# Patient Record
Sex: Male | Born: 1950
Health system: Southern US, Community
[De-identification: ages and names within clinical notes are randomized; demographics above are authoritative.]

## PROBLEM LIST (undated history)

## (undated) DIAGNOSIS — I2510 Atherosclerotic heart disease of native coronary artery without angina pectoris: Secondary | ICD-10-CM

## (undated) DIAGNOSIS — K635 Polyp of colon: Secondary | ICD-10-CM

## (undated) DIAGNOSIS — E039 Hypothyroidism, unspecified: Secondary | ICD-10-CM

## (undated) DIAGNOSIS — I10 Essential (primary) hypertension: Secondary | ICD-10-CM

## (undated) DIAGNOSIS — E785 Hyperlipidemia, unspecified: Secondary | ICD-10-CM

## (undated) DIAGNOSIS — N182 Chronic kidney disease, stage 2 (mild): Secondary | ICD-10-CM

## (undated) DIAGNOSIS — K219 Gastro-esophageal reflux disease without esophagitis: Secondary | ICD-10-CM

## (undated) DIAGNOSIS — J9819 Other pulmonary collapse: Secondary | ICD-10-CM

## (undated) HISTORY — DX: Chronic kidney disease, stage 2 (mild): N18.2

## (undated) HISTORY — DX: Atherosclerotic heart disease of native coronary artery without angina pectoris: I25.10

## (undated) HISTORY — PX: OTHER SURGICAL HISTORY: SHX169

## (undated) HISTORY — PX: ABDOMINAL SURGERY: SHX537

## (undated) HISTORY — DX: Hyperlipidemia, unspecified: E78.5

## (undated) HISTORY — DX: Hypothyroidism, unspecified: E03.9

## (undated) HISTORY — DX: Polyp of colon: K63.5

## (undated) HISTORY — DX: Essential (primary) hypertension: I10

---

## 2004-04-02 ENCOUNTER — Inpatient Hospital Stay (HOSPITAL_COMMUNITY): Admission: EM | Admit: 2004-04-02 | Discharge: 2004-05-01 | Payer: Self-pay

## 2004-05-25 ENCOUNTER — Encounter: Admission: RE | Admit: 2004-05-25 | Discharge: 2004-07-28 | Payer: Self-pay | Admitting: Orthopaedic Surgery

## 2004-06-16 ENCOUNTER — Emergency Department (HOSPITAL_COMMUNITY): Admission: EM | Admit: 2004-06-16 | Discharge: 2004-06-16 | Payer: Self-pay | Admitting: *Deleted

## 2008-05-04 ENCOUNTER — Encounter: Payer: Self-pay | Admitting: Emergency Medicine

## 2008-05-04 ENCOUNTER — Ambulatory Visit: Payer: Self-pay | Admitting: Cardiovascular Disease

## 2008-05-05 ENCOUNTER — Inpatient Hospital Stay (HOSPITAL_COMMUNITY): Admission: RE | Admit: 2008-05-05 | Discharge: 2008-05-12 | Payer: Self-pay | Admitting: Cardiology

## 2008-05-05 ENCOUNTER — Encounter: Payer: Self-pay | Admitting: Cardiology

## 2008-05-06 ENCOUNTER — Encounter: Payer: Self-pay | Admitting: Gastroenterology

## 2008-05-12 ENCOUNTER — Ambulatory Visit: Payer: Self-pay | Admitting: Gastroenterology

## 2008-07-06 ENCOUNTER — Ambulatory Visit: Payer: Self-pay | Admitting: Gastroenterology

## 2008-11-23 ENCOUNTER — Emergency Department (HOSPITAL_COMMUNITY): Admission: EM | Admit: 2008-11-23 | Discharge: 2008-11-23 | Payer: Self-pay | Admitting: Emergency Medicine

## 2011-03-12 ENCOUNTER — Emergency Department (HOSPITAL_COMMUNITY)
Admission: EM | Admit: 2011-03-12 | Discharge: 2011-03-12 | Disposition: A | Discharge status: Home / Self Care | Payer: Self-pay | Attending: Emergency Medicine | Admitting: Emergency Medicine

## 2011-03-12 ENCOUNTER — Encounter (HOSPITAL_COMMUNITY): Payer: Self-pay

## 2011-03-12 ENCOUNTER — Emergency Department (HOSPITAL_COMMUNITY): Payer: Self-pay

## 2011-03-12 DIAGNOSIS — J189 Pneumonia, unspecified organism: Secondary | ICD-10-CM | POA: Insufficient documentation

## 2011-03-12 DIAGNOSIS — R059 Cough, unspecified: Secondary | ICD-10-CM | POA: Insufficient documentation

## 2011-03-12 DIAGNOSIS — R05 Cough: Secondary | ICD-10-CM | POA: Insufficient documentation

## 2011-03-12 DIAGNOSIS — R197 Diarrhea, unspecified: Secondary | ICD-10-CM | POA: Insufficient documentation

## 2011-03-12 DIAGNOSIS — R109 Unspecified abdominal pain: Secondary | ICD-10-CM | POA: Insufficient documentation

## 2011-03-12 HISTORY — DX: Gastro-esophageal reflux disease without esophagitis: K21.9

## 2011-03-12 LAB — DIFFERENTIAL
Basophils Absolute: 0 10*3/uL (ref 0.0–0.1)
Monocytes Absolute: 0.6 10*3/uL (ref 0.1–1.0)
Monocytes Relative: 5 % (ref 3–12)
Neutro Abs: 9.7 10*3/uL — ABNORMAL HIGH (ref 1.7–7.7)
Neutrophils Relative %: 83 % — ABNORMAL HIGH (ref 43–77)

## 2011-03-12 LAB — URINALYSIS, ROUTINE W REFLEX MICROSCOPIC
Bilirubin Urine: NEGATIVE
Glucose, UA: NEGATIVE mg/dL
Specific Gravity, Urine: 1.014 (ref 1.005–1.030)
Urobilinogen, UA: 0.2 mg/dL (ref 0.0–1.0)
pH: 6 (ref 5.0–8.0)

## 2011-03-12 LAB — COMPREHENSIVE METABOLIC PANEL
ALT: 41 U/L (ref 0–53)
BUN: 14 mg/dL (ref 6–23)
Chloride: 104 mEq/L (ref 96–112)
Creatinine, Ser: 1.51 mg/dL — ABNORMAL HIGH (ref 0.4–1.5)
GFR calc Af Amer: 58 mL/min — ABNORMAL LOW (ref >60)
GFR calc non Af Amer: 48 mL/min — ABNORMAL LOW (ref >60)
Glucose, Bld: 91 mg/dL (ref 70–99)
Total Bilirubin: 0.6 mg/dL (ref 0.3–1.2)

## 2011-03-12 LAB — CBC
Hemoglobin: 11.5 g/dL — ABNORMAL LOW (ref 13.0–17.0)
MCH: 29.7 pg (ref 26.0–34.0)
MCHC: 34.2 g/dL (ref 30.0–36.0)
Platelets: 140 10*3/uL — ABNORMAL LOW (ref 150–400)
RDW: 15.8 % — ABNORMAL HIGH (ref 11.5–15.5)

## 2011-03-12 MED ORDER — IOHEXOL 300 MG/ML  SOLN
100.0000 mL | Freq: Once | INTRAMUSCULAR | Status: AC | PRN
  Administered 2011-03-12: 100 mL via INTRAVENOUS

## 2011-04-03 ENCOUNTER — Ambulatory Visit (HOSPITAL_COMMUNITY)
Admission: RE | Admit: 2011-04-03 | Discharge: 2011-04-03 | Disposition: A | Discharge status: Home / Self Care | Payer: Self-pay | Source: Ambulatory Visit | Attending: Family Medicine | Admitting: Family Medicine

## 2011-04-03 ENCOUNTER — Other Ambulatory Visit (HOSPITAL_COMMUNITY): Payer: Self-pay | Admitting: Family Medicine

## 2011-04-03 DIAGNOSIS — J90 Pleural effusion, not elsewhere classified: Secondary | ICD-10-CM | POA: Insufficient documentation

## 2011-04-03 DIAGNOSIS — R52 Pain, unspecified: Secondary | ICD-10-CM

## 2011-04-03 DIAGNOSIS — R0602 Shortness of breath: Secondary | ICD-10-CM | POA: Insufficient documentation

## 2011-04-03 DIAGNOSIS — R079 Chest pain, unspecified: Secondary | ICD-10-CM | POA: Insufficient documentation

## 2011-04-10 NOTE — Discharge Summary (Signed)
NAMEANKITH, EDMONSTON NO.:  [id number]   MEDICAL RECORD NO.:  [id number]          PATIENT TYPE:  INP   LOCATION:  2013                         FACILITY:  [facility]   PHYSICIAN:  Madaline Savage, MD        DATE OF BIRTH:  [birth year]   DATE OF ADMISSION:  05/05/2008  DATE OF DISCHARGE:  05/12/2008                               DISCHARGE SUMMARY   PRIMARY CARE PHYSICIAN:  None.   This patient was admitted under [facility] on May 05, 2008, and we were asked to see as a consult on the May 07, 2008.  We  took care of the patient on May 08, 2008, and Dr. Tamsen Roers has been  taking care of the patient.  I have started seeing the patient for the  first time today.   1. Hypothyroidism, likely Hashimoto thyroiditis.  2. Chronic cough.  3. Abnormal troponins which is noncardiac.   DISCHARGE MEDICATIONS:  1. Aspirin 325 mg daily.  2. Zocor 40 mg daily.  3. Claritin 10 mg daily as needed.  4. Toprol-XL 25 mg daily.  5. Protonix 40 mg twice daily.  6. Reglan 5 mg 3 times daily.  7. Synthroid 100 mcg daily.  8. Atenolol 50 mg every 6 hours as needed.   HISTORY OF PRESENT ILLNESS:  For full history and physical, see the  history and physical dictated by Dr. Lalla Brothers.  Mr. Bordner is a 60-  year-old gentleman who complained and came in with chronic cough.  He  was found to have elevated cardiac enzymes and he was transferred over  from [facility] to the service of Dr. Diona Browner.   PROCEDURES DONE IN THE HOSPITAL:  1. He had an ultrasound of the soft tissue which was done on May 06, 2008, which showed diffusely prominent heterogeneous thyroid      without focal mass.  2. He had a gastric emptying study done on May 07, 2008, which showed      delayed gastric emptying.   PROBLEM LIST:  1. Hypothyroidism.  Mr. Hepp was found to have severe      hypothyroidism on admission.  He had a TSH of 201.49.  His T4  was      0.42.  He was started  on Synthroid, 100 mcg. He sometimes have some      feeling in throat and we suspect that all his symptoms are likely      related to his hypothyroidism.  2. Elevated CPK.  Cardiology was consulted.  Cardiology has seen and      felt this as noncardiac.  Based on this and history, he will need      to follow up with Cardiology as an outpatient.  3. Chronic cough, most likely because of acid reflux disease.  GI was      consulted and he was started on PPI twice daily and at this time,      they also did gastroparesis, which did show some delayed gastric      emptying which could be secondary to  the hypothyroidism.  He was      started on Reglan at this time.   This patient has now been discharged home in stable condition.   FOLLOWUP:  He is asked to follow up with the primary care doctor in  about a week's time.  He will need a TSH check again in about 4-6 weeks.  He also needs to follow up with Dr. Diona Browner in about 2-3 weeks and Dr.  Candelaria Stagers, the gastroenterologist, in about 1 month's time.      Madaline Savage, MD  Electronically Signed     PKN/MEDQ  D:  05/12/2008  T:  05/13/2008  Job:  [id number]
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
## 2011-08-23 LAB — POCT CARDIAC MARKERS
CKMB, poc: 13.7
CKMB, poc: 9.7
Myoglobin, poc: 422
Myoglobin, poc: >500
Operator id: 244461
Operator id: 290111
Troponin i, poc: 0.16 — ABNORMAL HIGH

## 2011-08-23 LAB — LIPID PANEL
HDL: 42
LDL Cholesterol: 196 — ABNORMAL HIGH
Triglycerides: 298 — ABNORMAL HIGH
VLDL: 60 — ABNORMAL HIGH

## 2011-08-23 LAB — DIFFERENTIAL
Eosinophils Relative: 2
Lymphocytes Relative: 24
Monocytes Absolute: 0.5
Monocytes Relative: 5

## 2011-08-23 LAB — BASIC METABOLIC PANEL
BUN: 11
BUN: 6
CO2: 28
CO2: 29
CO2: 30
CO2: 31
CO2: 32
Calcium: 9.2
Calcium: 9.3
Calcium: 9.5
Calcium: 9.7
Chloride: 100
Chloride: 101
Chloride: 105
Creatinine, Ser: 1.19
Creatinine, Ser: 1.29
Creatinine, Ser: 1.38
Creatinine, Ser: 1.47
GFR calc Af Amer: 57 — ABNORMAL LOW
GFR calc Af Amer: >60
GFR calc Af Amer: >60
GFR calc Af Amer: >60
GFR calc Af Amer: >60
GFR calc Af Amer: >60
GFR calc non Af Amer: 47 — ABNORMAL LOW
Glucose, Bld: 96
Glucose, Bld: 99
Potassium: 3.9
Sodium: 139
Sodium: 140
Sodium: 140

## 2011-08-23 LAB — CBC
HCT: 35.9 — ABNORMAL LOW
HCT: 36.4 — ABNORMAL LOW
Hemoglobin: 12.3 — ABNORMAL LOW
Hemoglobin: 12.3 — ABNORMAL LOW
Hemoglobin: 12.8 — ABNORMAL LOW
Hemoglobin: 13.3
MCHC: 33.8
MCHC: 33.9
MCHC: 34.2
MCHC: 34.7
MCV: 88.9
MCV: 90.3
MCV: 90.3
RBC: 3.96 — ABNORMAL LOW
RBC: 3.98 — ABNORMAL LOW
RBC: 3.98 — ABNORMAL LOW
RBC: 4.03 — ABNORMAL LOW
RBC: 4.06 — ABNORMAL LOW
RBC: 4.38
RDW: 15.7 — ABNORMAL HIGH
RDW: 15.9 — ABNORMAL HIGH
RDW: 15.9 — ABNORMAL HIGH
RDW: 16.1 — ABNORMAL HIGH
WBC: 10.8 — ABNORMAL HIGH
WBC: 10.9 — ABNORMAL HIGH

## 2011-08-23 LAB — CK TOTAL AND CKMB (NOT AT ARMC)
Relative Index: 0.4
Total CK: 2489 — ABNORMAL HIGH
Total CK: 2753 — ABNORMAL HIGH

## 2011-08-23 LAB — PROTIME-INR: INR: 1

## 2011-08-23 LAB — COMPREHENSIVE METABOLIC PANEL
ALT: 45
AST: 63 — ABNORMAL HIGH
Albumin: 4
BUN: 5 — ABNORMAL LOW
CO2: 28
Calcium: 9.2
Chloride: 103
Creatinine, Ser: 1.29
GFR calc Af Amer: >60
GFR calc non Af Amer: 58 — ABNORMAL LOW
Potassium: 3.7
Total Protein: 7.7

## 2011-08-23 LAB — EXPECTORATED SPUTUM ASSESSMENT W GRAM STAIN, RFLX TO RESP C

## 2011-08-23 LAB — T4, FREE
Free T4: 0.41 — ABNORMAL LOW
Free T4: 0.42 — ABNORMAL LOW

## 2011-08-23 LAB — MISCELLANEOUS TEST

## 2011-08-23 LAB — TSH: TSH: 201.409 — ABNORMAL HIGH

## 2011-08-23 LAB — CULTURE, RESPIRATORY W GRAM STAIN

## 2011-08-23 LAB — MAGNESIUM: Magnesium: 2.3

## 2011-08-23 LAB — TROPONIN I: Troponin I: <0.01

## 2012-01-24 ENCOUNTER — Encounter (HOSPITAL_COMMUNITY): Payer: Self-pay | Admitting: Emergency Medicine

## 2012-01-24 ENCOUNTER — Other Ambulatory Visit: Payer: Self-pay

## 2012-01-24 ENCOUNTER — Emergency Department (HOSPITAL_COMMUNITY): Payer: Medicare Other

## 2012-01-24 ENCOUNTER — Inpatient Hospital Stay (HOSPITAL_COMMUNITY)
Admission: EM | Admit: 2012-01-24 | Discharge: 2012-01-27 | DRG: 391 | Disposition: A | Discharge status: Home / Self Care | Payer: Medicare Other | Attending: Internal Medicine | Admitting: Internal Medicine

## 2012-01-24 DIAGNOSIS — Z23 Encounter for immunization: Secondary | ICD-10-CM

## 2012-01-24 DIAGNOSIS — K5289 Other specified noninfective gastroenteritis and colitis: Secondary | ICD-10-CM | POA: Diagnosis not present

## 2012-01-24 DIAGNOSIS — K219 Gastro-esophageal reflux disease without esophagitis: Secondary | ICD-10-CM | POA: Diagnosis present

## 2012-01-24 DIAGNOSIS — J189 Pneumonia, unspecified organism: Secondary | ICD-10-CM

## 2012-01-24 DIAGNOSIS — D649 Anemia, unspecified: Secondary | ICD-10-CM | POA: Diagnosis not present

## 2012-01-24 DIAGNOSIS — E876 Hypokalemia: Secondary | ICD-10-CM | POA: Diagnosis not present

## 2012-01-24 DIAGNOSIS — N179 Acute kidney failure, unspecified: Secondary | ICD-10-CM | POA: Diagnosis present

## 2012-01-24 DIAGNOSIS — K529 Noninfective gastroenteritis and colitis, unspecified: Secondary | ICD-10-CM

## 2012-01-24 DIAGNOSIS — R55 Syncope and collapse: Secondary | ICD-10-CM | POA: Diagnosis present

## 2012-01-24 DIAGNOSIS — A088 Other specified intestinal infections: Secondary | ICD-10-CM | POA: Diagnosis not present

## 2012-01-24 DIAGNOSIS — N19 Unspecified kidney failure: Secondary | ICD-10-CM

## 2012-01-24 HISTORY — DX: Other pulmonary collapse: J98.19

## 2012-01-24 LAB — MAGNESIUM: Magnesium: 2.1 mg/dL (ref 1.5–2.5)

## 2012-01-24 LAB — CARDIAC PANEL(CRET KIN+CKTOT+MB+TROPI)
Relative Index: 0.5 (ref 0.0–2.5)
Total CK: 3531 U/L — ABNORMAL HIGH (ref 7–232)

## 2012-01-24 LAB — RETICULOCYTES
Retic Count, Absolute: 41.2 10*3/uL (ref 19.0–186.0)
Retic Ct Pct: 1.2 % (ref 0.4–3.1)

## 2012-01-24 LAB — COMPREHENSIVE METABOLIC PANEL
ALT: 36 U/L (ref 0–53)
CO2: 29 mEq/L (ref 19–32)
Calcium: 10.2 mg/dL (ref 8.4–10.5)
Creatinine, Ser: 1.49 mg/dL — ABNORMAL HIGH (ref 0.50–1.35)
GFR calc Af Amer: 58 mL/min — ABNORMAL LOW (ref >90)
GFR calc non Af Amer: 50 mL/min — ABNORMAL LOW (ref >90)
Glucose, Bld: 105 mg/dL — ABNORMAL HIGH (ref 70–99)

## 2012-01-24 LAB — CBC
HCT: 31.2 % — ABNORMAL LOW (ref 39.0–52.0)
Hemoglobin: 10.5 g/dL — ABNORMAL LOW (ref 13.0–17.0)
MCH: 29.2 pg (ref 26.0–34.0)
MCV: 86.9 fL (ref 78.0–100.0)
RBC: 3.59 MIL/uL — ABNORMAL LOW (ref 4.22–5.81)

## 2012-01-24 LAB — URINALYSIS, ROUTINE W REFLEX MICROSCOPIC
Hgb urine dipstick: NEGATIVE
Protein, ur: NEGATIVE mg/dL
Urobilinogen, UA: 0.2 mg/dL (ref 0.0–1.0)

## 2012-01-24 LAB — POCT I-STAT TROPONIN I: Troponin i, poc: 0 ng/mL (ref 0.00–0.08)

## 2012-01-24 LAB — HIV ANTIBODY (ROUTINE TESTING W REFLEX): HIV: NONREACTIVE

## 2012-01-24 MED ORDER — AZITHROMYCIN 250 MG PO TABS
500.0000 mg | ORAL_TABLET | Freq: Once | ORAL | Status: AC
  Administered 2012-01-24: 500 mg via ORAL
  Filled 2012-01-24: qty 2

## 2012-01-24 MED ORDER — ACETAMINOPHEN 325 MG PO TABS
650.0000 mg | ORAL_TABLET | Freq: Four times a day (QID) | ORAL | Status: DC | PRN
  Filled 2012-01-24: qty 2

## 2012-01-24 MED ORDER — SODIUM CHLORIDE 0.9 % IV SOLN
INTRAVENOUS | Status: DC
  Administered 2012-01-24: 1000 mL via INTRAVENOUS
  Administered 2012-01-25 (×2): via INTRAVENOUS
  Filled 2012-01-24 (×2): qty 1000

## 2012-01-24 MED ORDER — LEVOFLOXACIN IN D5W 500 MG/100ML IV SOLN
500.0000 mg | INTRAVENOUS | Status: DC
  Administered 2012-01-24 – 2012-01-26 (×3): 500 mg via INTRAVENOUS
  Filled 2012-01-24 (×4): qty 100

## 2012-01-24 MED ORDER — MORPHINE SULFATE 2 MG/ML IJ SOLN
1.0000 mg | INTRAMUSCULAR | Status: DC | PRN
  Administered 2012-01-24: 1 mg via INTRAVENOUS
  Filled 2012-01-24 (×2): qty 1

## 2012-01-24 MED ORDER — PANTOPRAZOLE SODIUM 40 MG IV SOLR
40.0000 mg | INTRAVENOUS | Status: DC
  Administered 2012-01-24 – 2012-01-26 (×3): 40 mg via INTRAVENOUS
  Filled 2012-01-24 (×3): qty 40

## 2012-01-24 MED ORDER — METRONIDAZOLE IN NACL 5-0.79 MG/ML-% IV SOLN
500.0000 mg | Freq: Three times a day (TID) | INTRAVENOUS | Status: DC
  Administered 2012-01-24 – 2012-01-26 (×6): 500 mg via INTRAVENOUS
  Filled 2012-01-24 (×10): qty 100

## 2012-01-24 MED ORDER — ACETAMINOPHEN 650 MG RE SUPP
650.0000 mg | Freq: Four times a day (QID) | RECTAL | Status: DC | PRN
  Filled 2012-01-24: qty 1

## 2012-01-24 MED ORDER — SODIUM CHLORIDE 0.9 % IV SOLN
Freq: Once | INTRAVENOUS | Status: AC
  Administered 2012-01-24: 03:00:00 via INTRAVENOUS

## 2012-01-24 MED ORDER — ONDANSETRON HCL 4 MG/2ML IJ SOLN
4.0000 mg | Freq: Four times a day (QID) | INTRAMUSCULAR | Status: DC | PRN
  Filled 2012-01-24: qty 2

## 2012-01-24 MED ORDER — IOHEXOL 300 MG/ML  SOLN
100.0000 mL | Freq: Once | INTRAMUSCULAR | Status: AC | PRN
  Administered 2012-01-24: 100 mL via INTRAVENOUS

## 2012-01-24 MED ORDER — INFLUENZA VIRUS VACC SPLIT PF IM SUSP
0.5000 mL | INTRAMUSCULAR | Status: AC
  Administered 2012-01-25: 0.5 mL via INTRAMUSCULAR
  Filled 2012-01-24: qty 0.5

## 2012-01-24 MED ORDER — ONDANSETRON HCL 4 MG/2ML IJ SOLN
4.0000 mg | Freq: Once | INTRAMUSCULAR | Status: AC
  Administered 2012-01-24: 4 mg via INTRAVENOUS
  Filled 2012-01-24: qty 2

## 2012-01-24 MED ORDER — ONDANSETRON HCL 4 MG PO TABS
4.0000 mg | ORAL_TABLET | Freq: Four times a day (QID) | ORAL | Status: DC | PRN
  Filled 2012-01-24: qty 1

## 2012-01-24 MED ORDER — HYDROMORPHONE HCL PF 1 MG/ML IJ SOLN
1.0000 mg | Freq: Once | INTRAMUSCULAR | Status: AC
  Administered 2012-01-24: 1 mg via INTRAVENOUS
  Filled 2012-01-24: qty 1

## 2012-01-24 MED ORDER — DEXTROSE 5 % IV SOLN
1.0000 g | Freq: Once | INTRAVENOUS | Status: AC
  Administered 2012-01-24: 1 g via INTRAVENOUS
  Filled 2012-01-24: qty 10

## 2012-01-24 MED ORDER — HYDROCODONE-ACETAMINOPHEN 5-325 MG PO TABS
1.0000 | ORAL_TABLET | ORAL | Status: DC | PRN
  Filled 2012-01-24: qty 2
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
## 2012-01-24 NOTE — Progress Notes (Signed)
ED CM noted no pcp listed for pt. He confirmed pcp is [facility] and could not recall the specific MD name.  CM spoke to [facility] at [facility] and made a hospital f/u appt for pt with Dr Venetia Night 03/11/12 10 am. Pt is made aware of appt
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
## 2012-01-24 NOTE — ED Notes (Signed)
ZOX:[id number]<AV> Expected date:<BR> Expected time:<BR> Means of arrival:<BR> Comments:<BR> EMS/abd pain/?LOC

## 2012-01-24 NOTE — ED Notes (Signed)
Brought in by EMS from home. Per EMS, pt woke up at 1230  With abdominal pain.

## 2012-01-25 LAB — COMPREHENSIVE METABOLIC PANEL
AST: 28 U/L (ref 0–37)
Alkaline Phosphatase: 42 U/L (ref 39–117)
CO2: 24 mEq/L (ref 19–32)
Chloride: 104 mEq/L (ref 96–112)
Creatinine, Ser: 1.33 mg/dL (ref 0.50–1.35)
GFR calc non Af Amer: 57 mL/min — ABNORMAL LOW (ref >90)
Potassium: 3.3 mEq/L — ABNORMAL LOW (ref 3.5–5.1)
Total Bilirubin: 0.5 mg/dL (ref 0.3–1.2)

## 2012-01-25 LAB — CARDIAC PANEL(CRET KIN+CKTOT+MB+TROPI): Total CK: 2875 U/L — ABNORMAL HIGH (ref 7–232)

## 2012-01-25 LAB — CBC
MCH: 29.4 pg (ref 26.0–34.0)
MCHC: 33.4 g/dL (ref 30.0–36.0)
Platelets: 104 10*3/uL — ABNORMAL LOW (ref 150–400)
RBC: 3.74 MIL/uL — ABNORMAL LOW (ref 4.22–5.81)
RDW: 15.4 % (ref 11.5–15.5)

## 2012-01-25 MED ORDER — DIPHENHYDRAMINE HCL 25 MG PO CAPS
25.0000 mg | ORAL_CAPSULE | Freq: Four times a day (QID) | ORAL | Status: DC | PRN
  Administered 2012-01-25 – 2012-01-26 (×4): 25 mg via ORAL
  Filled 2012-01-25 (×4): qty 1

## 2012-01-25 MED ORDER — POTASSIUM CHLORIDE 10 MEQ/100ML IV SOLN
10.0000 meq | INTRAVENOUS | Status: AC
  Administered 2012-01-25 (×2): 10 meq via INTRAVENOUS
  Filled 2012-01-25 (×2): qty 100

## 2012-01-25 NOTE — Progress Notes (Signed)
Subjective: Patient relates abdominal pain better, no more diarrhea. Feeling better.  Itching better.   Objective: Filed Vitals:   01/24/12 1846 01/24/12 2118 01/25/12 0519 01/25/12 1336  BP: 109/69 117/69 100/67 113/74  Pulse: 76 69 65 77  Temp: 98.7 F (37.1 C) 98.7 F (37.1 C) 98.2 F (36.8 C) 97.8 F (36.6 C)  TempSrc: Oral Oral Oral Oral  Resp: 18 18 18 18   Height: 5\' 4"  (1.626 m)     Weight: 73.9 kg (162 lb 14.7 oz)  73.4 kg (161 lb 13.1 oz)   SpO2: 87% 90% 89% 97%   Weight change:   Intake/Output Summary (Last 24 hours) at 01/25/12 1405 Last data filed at 01/25/12 1247  Gross per 24 hour  Intake    100 ml  Output   1150 ml  Net  -1050 ml    General: Alert, awake, oriented x3, in no acute distress.  HEENT: No bruits, no goiter.  Heart: Regular rate and rhythm, without murmurs, rubs, gallops.  Lungs: Crackles left side, bilateral air movement.  Abdomen: Soft, nontender, nondistended, positive bowel sounds.  Neuro: Grossly intact, nonfocal. Extremities; no edema.   Lab Results:  Basename 01/25/12 0500 01/24/12 2030 01/24/12 0310  NA 138 -- 138  K 3.3* -- 3.8  CL 104 -- 100  CO2 24 -- 29  GLUCOSE 89 -- 105*  BUN 7 -- 12  CREATININE 1.33 -- 1.49*  CALCIUM 9.1 -- 10.2  MG -- 2.1 --  PHOS -- -- --    Basename 01/25/12 0500 01/24/12 0310  AST 28 52*  ALT 21 36  ALKPHOS 42 50  BILITOT 0.5 0.4  PROT 6.9 8.1  ALBUMIN 3.9 4.7    Basename 01/24/12 0310  LIPASE 43  AMYLASE --    Basename 01/25/12 0500 01/24/12 0310  WBC 10.9* 8.3  NEUTROABS -- --  HGB 11.0* 10.5*  HCT 32.9* 31.2*  MCV 88.0 86.9  PLT 104* 135*    Basename 01/25/12 0035 01/24/12 1855 01/24/12 0815  CKTOTAL 2875* 3247* 3531*  CKMB 13.5* 14.7* 14.5*  CKMBINDEX -- -- --  TROPONINI <0.30 <0.30 <0.30    Basename 01/24/12 0950  VITAMINB12 --  FOLATE --  FERRITIN --  TIBC --  IRON --  RETICCTPCT 1.2     Studies/Results: Dg Chest 2 View  01/24/2012  *RADIOLOGY REPORT*   Clinical Data: Cough and congestion, shortness of breath, chest pain  CHEST - 2 VIEW  Comparison: Apr 03, 2011  Findings: Mild cardiomegaly is unchanged.  The mediastinum pulmonary vasculature are within normal limits.  There is chronic elevation of the left hemidiaphragm.  Increased opacity in the left lung base could represent atelectasis or pneumonia.  No gross effusion.  IMPRESSION: Left basilar atelectasis versus infiltrate.  Chronic elevation of the left hemi diaphragm.  Original Report Authenticated By: Brandon Melnick, M.D.   Ct Abdomen Pelvis W Contrast  01/24/2012  *RADIOLOGY REPORT*  Clinical Data: Severe upper abdominal pain.  CT ABDOMEN AND PELVIS WITH CONTRAST  Technique:  Multidetector CT imaging of the abdomen and pelvis was performed following the standard protocol during bolus administration of intravenous contrast.  Contrast: OMNIPAQUE IOHEXOL 300 MG/ML IJ SOLN  Comparison: CT of the abdomen and pelvis performed 03/12/2011  Findings: Left lower lobe airspace opacification, with air bronchograms, raises concern for pneumonia.  Contrast is noted within the distal esophagus, raising question for mild esophageal dysmotility or gastroesophageal reflux.  The liver and spleen are unremarkable in appearance.  The  gallbladder is within normal limits.  The pancreas and adrenal glands are unremarkable.  Mild nonspecific perinephric stranding is noted bilaterally.  The kidneys are otherwise unremarkable in appearance.  There is no evidence of hydronephrosis.  No renal or ureteral stones are seen.  The small bowel is unremarkable in appearance.  The stomach is filled with contrast and is grossly unremarkable in appearance.  No acute vascular abnormalities are seen.  Mild scattered calcification is noted along the distal abdominal aorta and its branches.  A tiny umbilical hernia is noted, containing only fat.  The appendix is borderline prominent but contains air, without definite evidence of  appendicitis.  There is mild diffuse wall thickening noted along the entirety of the colon, with minimal associated soft tissue inflammation, compatible with mild diffuse colitis.  There is no evidence of perforation or abscess formation.  No significant free fluid is seen.  The bladder is mildly distended and grossly unremarkable in appearance.  The prostate is normal in size, with scattered calcification.  There is a relatively superior position to the right testis, along the inferior right inguinal canal.  No inguinal lymphadenopathy is seen.  No acute osseous abnormalities are identified.  IMPRESSION:  1.  Mild diffuse colitis noted, with diffuse wall thickening along the entirety of the colon, and minimal associated soft tissue inflammation.  No evidence of perforation or abscess formation.  No free fluid seen. 2.  Left lower lobe airspace opacification, with air bronchograms, raises concern for pneumonia. 3.  Tiny umbilical hernia, containing only fat. 4.  Contrast within the distal esophagus raises question for mild esophageal dysmotility or possibly gastroesophageal reflux. 5.  Relatively superior position to the right testis, along the inferior right inguinal canal; this may be transient in nature. 6.  Mild scattered calcification along the distal abdominal aorta and its branches.  Original Report Authenticated By: Tonia Ghent, M.D.    Medications: I have reviewed the patient's current medications.  Colitis - presumed infectious origin (01/24/2012) Patient presents with abdominal pain nausea, vomiting and  diarrhea. CT findings consistent with a mild diffuse colitis. This is probably infectious in origin.  C. difficile, stool culture.  Continue with Levaquin and Flagyl day 2. We'll check guaiac stool.   PNA (pneumonia) (01/24/2012) Patient is complaining of cough, subjective fever. CT abdomen show possibility of left lower lobe opacification concern for pneumonia.  chest x-ray left lower lobe  infiltrates vs atelectasis . Levaquin to cover for pneumonia. I will check a sputum culture, HIV negative.   Syncope and collapse (01/24/2012) Patient presented after a syncope event in the setting of dehydration , also question of vaso vagal syncope. Troponin negative. Renal failure (01/24/2012) Patient with prior creatinine at 1.5 per records in 2009. Unclear Cr baseline. I will treat for acute renal insufficiency. Nsl.   Anemia; I will check anemia panel. Guaiac stool.  GERD: Protonix.  Hypokalemia; replete with IV 2 runs.      LOS: 1 day   Rainer Mounce M.D.  [facility] 01/25/2012, 2:05 PM

## 2012-01-26 LAB — OCCULT BLOOD X 1 CARD TO LAB, STOOL: Fecal Occult Bld: NEGATIVE

## 2012-01-26 LAB — CLOSTRIDIUM DIFFICILE BY PCR: Toxigenic C. Difficile by PCR: NEGATIVE

## 2012-01-26 LAB — CBC
MCH: 29.3 pg (ref 26.0–34.0)
MCHC: 33.7 g/dL (ref 30.0–36.0)
Platelets: 105 10*3/uL — ABNORMAL LOW (ref 150–400)
RDW: 15.5 % (ref 11.5–15.5)

## 2012-01-26 LAB — BASIC METABOLIC PANEL
Calcium: 8.9 mg/dL (ref 8.4–10.5)
GFR calc non Af Amer: 49 mL/min — ABNORMAL LOW (ref >90)
Sodium: 137 mEq/L (ref 135–145)

## 2012-01-26 MED ORDER — AZITHROMYCIN 500 MG PO TABS
500.0000 mg | ORAL_TABLET | Freq: Every day | ORAL | Status: DC
  Administered 2012-01-26 – 2012-01-27 (×2): 500 mg via ORAL
  Filled 2012-01-26 (×3): qty 1

## 2012-01-26 MED ORDER — POTASSIUM CHLORIDE CRYS ER 20 MEQ PO TBCR
40.0000 meq | EXTENDED_RELEASE_TABLET | Freq: Once | ORAL | Status: AC
  Administered 2012-01-26: 40 meq via ORAL
  Filled 2012-01-26: qty 2

## 2012-01-26 MED ORDER — PANTOPRAZOLE SODIUM 40 MG PO TBEC
40.0000 mg | DELAYED_RELEASE_TABLET | Freq: Every day | ORAL | Status: DC
  Administered 2012-01-27: 40 mg via ORAL
  Filled 2012-01-26 (×2): qty 1

## 2012-01-26 MED ORDER — SODIUM CHLORIDE 0.9 % IJ SOLN
3.0000 mL | Freq: Two times a day (BID) | INTRAMUSCULAR | Status: DC
  Administered 2012-01-26 – 2012-01-27 (×2): 3 mL via INTRAVENOUS

## 2012-01-26 MED ORDER — METRONIDAZOLE 500 MG PO TABS
500.0000 mg | ORAL_TABLET | Freq: Three times a day (TID) | ORAL | Status: DC
  Administered 2012-01-26 – 2012-01-27 (×4): 500 mg via ORAL
  Filled 2012-01-26 (×9): qty 1

## 2012-01-26 NOTE — Progress Notes (Signed)
The patient is receiving Protonix by the intravenous route.  Based on criteria approved by the Pharmacy and Therapeutics Committee and the Medical Executive Committee, the medication is being converted to the equivalent oral dose form.  These criteria include: -No Active GI bleeding -Able to tolerate diet of full liquids (or better) or tube feeding -Able to tolerate other medications by the oral or enteral route  If you have any questions about this conversion, please contact the Pharmacy Department (ext [phone]).  Thank you.  Chilton Si, Adar Rase L 2:42 PM

## 2012-01-26 NOTE — Progress Notes (Signed)
Subjective: Denies abdominal pain. No chest pain. Stool soft, diarrhea improved.  Objective: Filed Vitals:   01/25/12 0519 01/25/12 1336 01/25/12 2123 01/26/12 0500  BP: 100/67 113/74 111/72   Pulse: 65 77 72   Temp: 98.2 F (36.8 C) 97.8 F (36.6 C) 98 F (36.7 C)   TempSrc: Oral Oral Oral   Resp: 18 18 18    Height:      Weight: 73.4 kg (161 lb 13.1 oz)   73.8 kg (162 lb 11.2 oz)  SpO2: 89% 97% 96%    Weight change: -0.1 kg (-3.5 oz)  Intake/Output Summary (Last 24 hours) at 01/26/12 1317 Last data filed at 01/26/12 0800  Gross per 24 hour  Intake    920 ml  Output   1140 ml  Net   -220 ml    General: Alert, awake, oriented x3, in no acute distress.  HEENT: No bruits, no goiter.  Heart: Regular rate and rhythm, without murmurs, rubs, gallops.  Lungs: Crackles left side, bilateral air movement.  Abdomen: Soft, nontender, nondistended, positive bowel sounds.  Extremities no edema.  Lab Results:  Basename 01/26/12 0530 01/25/12 0500 01/24/12 2030  NA 137 138 --  K 3.2* 3.3* --  CL 104 104 --  CO2 27 24 --  GLUCOSE 77 89 --  BUN 5* 7 --  CREATININE 1.51* 1.33 --  CALCIUM 8.9 9.1 --  MG -- -- 2.1  PHOS -- -- --    Basename 01/25/12 0500 01/24/12 0310  AST 28 52*  ALT 21 36  ALKPHOS 42 50  BILITOT 0.5 0.4  PROT 6.9 8.1  ALBUMIN 3.9 4.7    Basename 01/24/12 0310  LIPASE 43  AMYLASE --    Basename 01/26/12 0530 01/25/12 0500  WBC 9.8 10.9*  NEUTROABS -- --  HGB 9.6* 11.0*  HCT 28.5* 32.9*  MCV 86.9 88.0  PLT 105* 104*    Basename 01/25/12 0035 01/24/12 1855 01/24/12 0815  CKTOTAL 2875* 3247* 3531*  CKMB 13.5* 14.7* 14.5*  CKMBINDEX -- -- --  TROPONINI <0.30 <0.30 <0.30    Basename 01/24/12 0950  VITAMINB12 --  FOLATE --  FERRITIN --  TIBC --  IRON --  RETICCTPCT 1.2    Micro Results: No results found for this or any previous visit (from the past 240 hour(s)).  Studies/Results: No results found.  Medications: I have reviewed the  patient's current medications.  Colitis - presumed infectious origin (01/24/2012) Patient presents with abdominal pain nausea, vomiting and diarrhea. CT findings consistent with a mild diffuse colitis. This is probably infectious in origin. C. difficile, stool culture pending. Continue Flagyl day 2. Azithromycin.   PNA (pneumonia) (01/24/2012) Patient is complaining of cough, subjective fever. CT abdomen show possibility of left lower lobe opacification concern for pneumonia. chest x-ray left lower lobe infiltrates vs atelectasis . Levaquin 2 days, but patient with itching. Change antibiotics to azithro. I will check a sputum culture, HIV negative.   Syncope and collapse (01/24/2012) Patient presented after a syncope event in the setting of dehydration , also question of vaso vagal syncope. Troponin negative.  Renal failure (01/24/2012) Patient with prior creatinine at 1.5 per records in 2009. Unclear Cr baseline. I will treat for acute renal insufficiency. Nsl. Cr increase 1.5 likely baseline.  Anemia; monitor hb. Occult blood negative. GERD: Protonix.  Hypokalemia; Replace with 40 meq po times 1.      LOS: 2 days   George Robbins M.D.  [facility] 01/26/2012, 1:17 PM

## 2012-01-27 MED ORDER — PANTOPRAZOLE SODIUM 40 MG PO TBEC: 40.0000 mg | DELAYED_RELEASE_TABLET | Freq: Every day | ORAL | Status: DC

## 2012-01-27 MED ORDER — METRONIDAZOLE 500 MG PO TABS: 500.0000 mg | ORAL_TABLET | Freq: Three times a day (TID) | ORAL | Status: AC

## 2012-01-27 MED ORDER — AZITHROMYCIN 500 MG PO TABS: 500.0000 mg | ORAL_TABLET | Freq: Every day | ORAL | Status: AC

## 2012-01-27 NOTE — Discharge Summary (Signed)
Admit date: 01/24/2012 Discharge date: 01/27/2012  Primary Care Physician:  Jaclyn Shaggy, MD, MD   Discharge Diagnoses:    . Colitis - presumed infectious origin 01/24/2012   . PNA (pneumonia) 01/24/2012   . Syncope and collapse 01/24/2012   . Renal failure 01/24/2012              DISCHARGE MEDICATION: Medication List  As of 01/27/2012  9:49 AM   TAKE these medications         azithromycin 500 MG tablet   Commonly known as: ZITHROMAX   Take 1 tablet (500 mg total) by mouth daily.      metroNIDAZOLE 500 MG tablet   Commonly known as: FLAGYL   Take 1 tablet (500 mg total) by mouth every 8 (eight) hours.      pantoprazole 40 MG tablet   Commonly known as: PROTONIX   Take 1 tablet (40 mg total) by mouth daily at 12 noon.              Consults:  none   SIGNIFICANT DIAGNOSTIC STUDIES:  Dg Chest 2 View  01/24/2012  *RADIOLOGY REPORT*  Clinical Data: Cough and congestion, shortness of breath, chest pain  CHEST - 2 VIEW  Comparison: Apr 03, 2011  Findings: Mild cardiomegaly is unchanged.  The mediastinum pulmonary vasculature are within normal limits.  There is chronic elevation of the left hemidiaphragm.  Increased opacity in the left lung base could represent atelectasis or pneumonia.  No gross effusion.  IMPRESSION: Left basilar atelectasis versus infiltrate.  Chronic elevation of the left hemi diaphragm.  Original Report Authenticated By: Brandon Melnick, M.D.   Ct Abdomen Pelvis W Contrast  01/24/2012  *RADIOLOGY REPORT*  Clinical Data: Severe upper abdominal pain.  CT ABDOMEN AND PELVIS WITH CONTRAST  Technique:  Multidetector CT imaging of the abdomen and pelvis was performed following the standard protocol during bolus administration of intravenous contrast.  Contrast: OMNIPAQUE IOHEXOL 300 MG/ML IJ SOLN  Comparison: CT of the abdomen and pelvis performed 03/12/2011  Findings: Left lower lobe airspace opacification, with air bronchograms, raises concern for pneumonia.   Contrast is noted within the distal esophagus, raising question for mild esophageal dysmotility or gastroesophageal reflux.  The liver and spleen are unremarkable in appearance.  The gallbladder is within normal limits.  The pancreas and adrenal glands are unremarkable.  Mild nonspecific perinephric stranding is noted bilaterally.  The kidneys are otherwise unremarkable in appearance.  There is no evidence of hydronephrosis.  No renal or ureteral stones are seen.  The small bowel is unremarkable in appearance.  The stomach is filled with contrast and is grossly unremarkable in appearance.  No acute vascular abnormalities are seen.  Mild scattered calcification is noted along the distal abdominal aorta and its branches.  A tiny umbilical hernia is noted, containing only fat.  The appendix is borderline prominent but contains air, without definite evidence of appendicitis.  There is mild diffuse wall thickening noted along the entirety of the colon, with minimal associated soft tissue inflammation, compatible with mild diffuse colitis.  There is no evidence of perforation or abscess formation.  No significant free fluid is seen.  The bladder is mildly distended and grossly unremarkable in appearance.  The prostate is normal in size, with scattered calcification.  There is a relatively superior position to the right testis, along the inferior right inguinal canal.  No inguinal lymphadenopathy is seen.  No acute osseous abnormalities are identified.  IMPRESSION:  1.  Mild diffuse colitis noted, with diffuse wall thickening along the entirety of the colon, and minimal associated soft tissue inflammation.  No evidence of perforation or abscess formation.  No free fluid seen. 2.  Left lower lobe airspace opacification, with air bronchograms, raises concern for pneumonia. 3.  Tiny umbilical hernia, containing only fat. 4.  Contrast within the distal esophagus raises question for mild esophageal dysmotility or possibly  gastroesophageal reflux. 5.  Relatively superior position to the right testis, along the inferior right inguinal canal; this may be transient in nature. 6.  Mild scattered calcification along the distal abdominal aorta and its branches.  Original Report Authenticated By: Tonia Ghent, M.D.      Recent Results (from the past 240 hour(s))  CLOSTRIDIUM DIFFICILE BY PCR     Status: Normal   Collection Time   01/26/12 10:26 AM      Component Value Range Status Comment   C difficile by pcr NEGATIVE  NEGATIVE  Final     BRIEF ADMITTING H & P: 61 year old with nonsignificant past medical history present to the emergency department after syncope episode. Patient relate that the night prior to admission he started to have diarrhea and abdominal pain. He had 2 watery stool. When he came from the bathroom, he felt lightheadedness and he passed out on his bed. lasting approximately 5 minutes according to family members. No seizure like activity, no urinary or bowel incontinence. No tongue bite. He has been coughing for the last 5 days prior to admission, he relates also subjective fever. He has vomited multiple times. No blood on it.  Hospital Course:  Colitis - presumed infectious origin (01/24/2012) Patient presents with abdominal pain nausea, vomiting and diarrhea. CT findings consistent with a mild diffuse colitis. This is probably infectious in origin. C. Difficile negative, stool culture pending. Continue Flagyl day 3. Azithromycin. Will give prescription for 5 more days. Patient will need colonoscopy at some point. Needs to follow up with PCP.   PNA (pneumonia) (01/24/2012) Patient is complaining of cough, subjective fever. CT abdomen show possibility of left lower lobe opacification concern for pneumonia. chest x-ray left lower lobe infiltrates vs atelectasis . Received Levaquin 2 days, but patient with itching. Change antibiotics to azithro. HIV negative. Will give prescription for 5 more days.    Syncope and collapse (01/24/2012) Patient presented after a syncope event in the setting of dehydration , also question of vaso vagal syncope. Troponin negative. Resolved. Renal failure (01/24/2012) Patient with prior creatinine at 1.5 per records in 2009. Unclear Cr baseline. I will treat for acute renal insufficiency. Nsl. Cr increase 1.5 likely baseline.  Anemia; monitor hb. Occult blood negative.  GERD: Protonix.  Hypokalemia; Replace.   Disposition and Follow-up:  Discharge Orders    Future Orders Please Complete By Expires   Diet - low sodium heart healthy      Increase activity slowly        Follow-up Information    Follow up on 03/11/2012. ([facility] f[facility]u appt for pt with Dr Venetia Night 03/11/12 10 am)    Contact information:   [address] [place]  [phone]      Follow up with Jaclyn Shaggy, MD .          DISCHARGE EXAM:  General: Alert, awake, oriented x3, in no acute distress.  HEENT: No bruits, no goiter.  Heart: Regular rate and rhythm, without murmurs, rubs, gallops.  Lungs: Crackles left side, bilateral air movement.  Abdomen: Soft, nontender, nondistended, positive bowel sounds.  Extremities no edema.   Blood pressure 109/69, pulse 78, temperature 97.6 F (36.4 C), temperature source Oral, resp. rate 18, height 5\' 4"  (1.626 m), weight 75.1 kg (165 lb 9.1 oz), SpO2 94.00%.   Basename 01/26/12 0530 01/25/12 0500 01/24/12 2030  NA 137 138 --  K 3.2* 3.3* --  CL 104 104 --  CO2 27 24 --  GLUCOSE 77 89 --  BUN 5* 7 --  CREATININE 1.51* 1.33 --  CALCIUM 8.9 9.1 --  MG -- -- 2.1  PHOS -- -- --    Basename 01/25/12 0500  AST 28  ALT 21  ALKPHOS 42  BILITOT 0.5  PROT 6.9  ALBUMIN 3.9   Basename 01/26/12 0530 01/25/12 0500  WBC 9.8 10.9*  NEUTROABS -- --  HGB 9.6* 11.0*  HCT 28.5* 32.9*  MCV 86.9 88.0  PLT 105* 104*    Signed: Ileanna Gemmill M.D. 01/27/2012, 9:49 AM

## 2012-01-27 NOTE — Plan of Care (Signed)
Problem: Phase I Progression Outcomes Goal: EF % per last Echo/documented,Core Reminder form on chart Outcome: Completed/Met Date Met:  01/27/12 EF= 50%

## 2012-01-30 LAB — STOOL CULTURE

## 2013-08-08 ENCOUNTER — Emergency Department (HOSPITAL_COMMUNITY)
Admission: EM | Admit: 2013-08-08 | Discharge: 2013-08-08 | Disposition: A | Discharge status: Home / Self Care | Payer: Medicare Other | Attending: Emergency Medicine | Admitting: Emergency Medicine

## 2013-08-08 ENCOUNTER — Encounter (HOSPITAL_COMMUNITY): Payer: Self-pay | Admitting: Emergency Medicine

## 2013-08-08 ENCOUNTER — Emergency Department (HOSPITAL_COMMUNITY): Payer: Medicare Other

## 2013-08-08 DIAGNOSIS — R0602 Shortness of breath: Secondary | ICD-10-CM | POA: Insufficient documentation

## 2013-08-08 DIAGNOSIS — R079 Chest pain, unspecified: Secondary | ICD-10-CM | POA: Diagnosis not present

## 2013-08-08 DIAGNOSIS — R1013 Epigastric pain: Secondary | ICD-10-CM | POA: Insufficient documentation

## 2013-08-08 DIAGNOSIS — Z8719 Personal history of other diseases of the digestive system: Secondary | ICD-10-CM | POA: Insufficient documentation

## 2013-08-08 DIAGNOSIS — Z87891 Personal history of nicotine dependence: Secondary | ICD-10-CM | POA: Diagnosis not present

## 2013-08-08 DIAGNOSIS — Z8709 Personal history of other diseases of the respiratory system: Secondary | ICD-10-CM | POA: Diagnosis not present

## 2013-08-08 LAB — COMPREHENSIVE METABOLIC PANEL
AST: 35 U/L (ref 0–37)
CO2: 26 mEq/L (ref 19–32)
Calcium: 10.1 mg/dL (ref 8.4–10.5)
Creatinine, Ser: 1.54 mg/dL — ABNORMAL HIGH (ref 0.50–1.35)
GFR calc Af Amer: 55 mL/min — ABNORMAL LOW (ref >90)
GFR calc non Af Amer: 47 mL/min — ABNORMAL LOW (ref >90)
Sodium: 139 mEq/L (ref 135–145)
Total Protein: 8 g/dL (ref 6.0–8.3)

## 2013-08-08 LAB — CBC WITH DIFFERENTIAL/PLATELET
Basophils Absolute: 0 10*3/uL (ref 0.0–0.1)
Eosinophils Absolute: 0.4 10*3/uL (ref 0.0–0.7)
Eosinophils Relative: 7 % — ABNORMAL HIGH (ref 0–5)
HCT: 29.1 % — ABNORMAL LOW (ref 39.0–52.0)
Lymphocytes Relative: 13 % (ref 12–46)
MCH: 29.6 pg (ref 26.0–34.0)
MCHC: 33.7 g/dL (ref 30.0–36.0)
MCV: 87.9 fL (ref 78.0–100.0)
Monocytes Absolute: 0.3 10*3/uL (ref 0.1–1.0)
Platelets: 97 10*3/uL — ABNORMAL LOW (ref 150–400)
RDW: 16.1 % — ABNORMAL HIGH (ref 11.5–15.5)
WBC: 6.5 10*3/uL (ref 4.0–10.5)

## 2013-08-08 LAB — TROPONIN I
Troponin I: <0.3 ng/mL (ref <0.30)
Troponin I: <0.3 ng/mL (ref <0.30)

## 2013-08-08 LAB — LACTIC ACID, PLASMA: Lactic Acid, Venous: 1.1 mmol/L (ref 0.5–2.2)

## 2013-08-08 MED ORDER — GI COCKTAIL ~~LOC~~
30.0000 mL | Freq: Once | ORAL | Status: AC
  Administered 2013-08-08: 30 mL via ORAL
  Filled 2013-08-08: qty 30

## 2013-08-08 MED ORDER — ASPIRIN 81 MG PO CHEW
324.0000 mg | CHEWABLE_TABLET | Freq: Once | ORAL | Status: AC
  Administered 2013-08-08: 324 mg via ORAL
  Filled 2013-08-08: qty 4

## 2013-08-08 MED ORDER — RANITIDINE HCL 75 MG PO TABS: 75.0000 mg | ORAL_TABLET | Freq: Two times a day (BID) | ORAL | Status: DC

## 2013-08-08 MED ORDER — NITROGLYCERIN 0.4 MG SL SUBL: 0.4000 mg | SUBLINGUAL_TABLET | SUBLINGUAL | Status: DC | PRN

## 2013-08-08 NOTE — ED Notes (Signed)
EDP [place] at bedside. 

## 2013-08-08 NOTE — ED Notes (Signed)
Per EMS - pt was eating hot wings then he started to get heartburn and gas. Family thought it would be best for him to go to hospital. Pt denies chest pain/n/v. BP 132/91 HR 70 RR18 98% on room air.

## 2013-08-08 NOTE — ED Notes (Signed)
Phlebotomy at bedside.

## 2013-08-08 NOTE — ED Notes (Signed)
Placed pt on 2 liters/min oxygen.
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
## 2013-10-08 ENCOUNTER — Ambulatory Visit: Payer: Medicare Other | Attending: Internal Medicine | Admitting: Internal Medicine

## 2013-10-08 VITALS — BP 131/79 | HR 76 | Temp 97.9°F | Resp 16 | Wt 167.0 lb

## 2013-10-08 DIAGNOSIS — N182 Chronic kidney disease, stage 2 (mild): Secondary | ICD-10-CM

## 2013-10-08 DIAGNOSIS — E039 Hypothyroidism, unspecified: Secondary | ICD-10-CM

## 2013-10-08 DIAGNOSIS — I10 Essential (primary) hypertension: Secondary | ICD-10-CM | POA: Insufficient documentation

## 2013-10-08 HISTORY — DX: Chronic kidney disease, stage 2 (mild): N18.2

## 2013-10-08 HISTORY — DX: Hypothyroidism, unspecified: E03.9

## 2013-10-08 HISTORY — DX: Essential (primary) hypertension: I10

## 2013-10-08 MED ORDER — CARVEDILOL 3.125 MG PO TABS: 3.1250 mg | ORAL_TABLET | Freq: Two times a day (BID) | ORAL | Status: DC

## 2013-10-08 MED ORDER — CETIRIZINE HCL 10 MG PO CAPS: 1.0000 | ORAL_CAPSULE | Freq: Every day | ORAL | Status: DC

## 2013-10-08 MED ORDER — OMEPRAZOLE 40 MG PO CPDR: 40.0000 mg | DELAYED_RELEASE_CAPSULE | Freq: Every day | ORAL | Status: DC

## 2013-10-08 MED ORDER — ASPIRIN EC 81 MG PO TBEC: 81.0000 mg | DELAYED_RELEASE_TABLET | Freq: Every day | ORAL | Status: DC
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
## 2013-10-08 NOTE — Progress Notes (Unsigned)
Patient complains of cough Heart burn Back pain that travels down his right leg

## 2013-10-08 NOTE — Progress Notes (Unsigned)
Unable to draw blood Will get A1C at his follow up Sent over to [facility]

## 2013-10-09 ENCOUNTER — Telehealth: Payer: Self-pay

## 2013-10-09 LAB — COMPLETE METABOLIC PANEL WITH GFR
ALT: 19 U/L (ref 0–53)
AST: 28 U/L (ref 0–37)
Creat: 1.54 mg/dL — ABNORMAL HIGH (ref 0.50–1.35)
Sodium: 139 mEq/L (ref 135–145)
Total Bilirubin: 0.6 mg/dL (ref 0.3–1.2)

## 2013-10-09 LAB — CBC
MCH: 29.3 pg (ref 26.0–34.0)
MCHC: 34 g/dL (ref 30.0–36.0)
Platelets: 119 10*3/uL — ABNORMAL LOW (ref 150–400)
RBC: 3.34 MIL/uL — ABNORMAL LOW (ref 4.22–5.81)

## 2013-10-09 NOTE — Telephone Encounter (Signed)
Scheduled patients echo 10/15/13 at 3pm at [facility] Patient is aware of his appointment

## 2013-10-10 LAB — TSH: TSH: 157.351 u[IU]/mL — ABNORMAL HIGH (ref 0.350–4.500)

## 2013-10-12 ENCOUNTER — Encounter: Payer: Self-pay | Admitting: Internal Medicine

## 2013-10-12 MED ORDER — LEVOTHYROXINE SODIUM 50 MCG PO TABS: 50.0000 ug | ORAL_TABLET | Freq: Every day | ORAL | Status: DC

## 2013-10-12 NOTE — Telephone Encounter (Signed)
Pt given lab results and told to pick script Synthroid at [facility]

## 2013-10-12 NOTE — Telephone Encounter (Signed)
Message copied by Darlis Loan on Mon Oct 12, 2013  1:14 PM ------      Message from: [facility], [place] [facility]      Created: Mon Oct 12, 2013  8:59 AM       Kindly let the patient know that he is extremely hypothyroid, Synthroid called into his pharmacy, should come back in 4 weeks for repeat TSH ------

## 2013-10-12 NOTE — Progress Notes (Signed)
Quick Note:  Kindly let the patient know that he is extremely hypothyroid, Synthroid called into his pharmacy, should come back in 4 weeks for repeat TSH ______

## 2013-10-15 ENCOUNTER — Ambulatory Visit (HOSPITAL_COMMUNITY)
Admission: RE | Admit: 2013-10-15 | Discharge: 2013-10-15 | Disposition: A | Discharge status: Home / Self Care | Payer: No Typology Code available for payment source | Source: Ambulatory Visit | Attending: Internal Medicine | Admitting: Internal Medicine

## 2013-10-15 DIAGNOSIS — I379 Nonrheumatic pulmonary valve disorder, unspecified: Secondary | ICD-10-CM | POA: Insufficient documentation

## 2013-10-15 DIAGNOSIS — R0989 Other specified symptoms and signs involving the circulatory and respiratory systems: Secondary | ICD-10-CM | POA: Insufficient documentation

## 2013-10-15 DIAGNOSIS — R0609 Other forms of dyspnea: Secondary | ICD-10-CM | POA: Insufficient documentation

## 2013-10-15 DIAGNOSIS — N182 Chronic kidney disease, stage 2 (mild): Secondary | ICD-10-CM

## 2013-10-15 DIAGNOSIS — Z87891 Personal history of nicotine dependence: Secondary | ICD-10-CM | POA: Insufficient documentation

## 2013-10-15 NOTE — Progress Notes (Signed)
Echocardiogram 2D Echocardiogram has been performed.  George Robbins 10/15/2013, 3:37 PM

## 2013-10-28 ENCOUNTER — Ambulatory Visit: Payer: No Typology Code available for payment source | Attending: Cardiology | Admitting: Cardiology

## 2013-10-28 ENCOUNTER — Encounter: Payer: Self-pay | Admitting: Cardiology

## 2013-10-28 VITALS — BP 133/86 | HR 73 | Temp 97.9°F | Resp 16 | Ht 64.0 in | Wt 167.0 lb

## 2013-10-28 DIAGNOSIS — E039 Hypothyroidism, unspecified: Secondary | ICD-10-CM

## 2013-10-28 DIAGNOSIS — I10 Essential (primary) hypertension: Secondary | ICD-10-CM

## 2013-10-28 DIAGNOSIS — R0602 Shortness of breath: Secondary | ICD-10-CM | POA: Insufficient documentation

## 2013-10-28 DIAGNOSIS — R0609 Other forms of dyspnea: Secondary | ICD-10-CM | POA: Insufficient documentation
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
## 2013-10-28 NOTE — Assessment & Plan Note (Signed)
His dyspnea on  exertion is chronic. This a  combination of his history of a collapsed left lung with residual left hemidiaphragm elevation, obesity, deconditioning, and probably chronic aspiration. I have asked him to elevate the head of his bed, continue omeprazole, and avoid late night eating. No further cardiac workup. He was informed that his echocardiogram is normal.

## 2013-11-11 ENCOUNTER — Ambulatory Visit: Payer: No Typology Code available for payment source | Attending: Internal Medicine

## 2013-11-11 DIAGNOSIS — E079 Disorder of thyroid, unspecified: Secondary | ICD-10-CM

## 2013-11-11 LAB — T3 UPTAKE: T3 Uptake: 27.4 % (ref 22.5–37.0)

## 2013-11-13 ENCOUNTER — Ambulatory Visit: Payer: Self-pay

## 2013-11-18 ENCOUNTER — Ambulatory Visit (AMBULATORY_SURGERY_CENTER): Payer: Self-pay

## 2013-11-18 VITALS — Ht 64.0 in | Wt 160.0 lb

## 2013-11-18 DIAGNOSIS — Z800 Family history of malignant neoplasm of digestive organs: Secondary | ICD-10-CM

## 2013-11-18 MED ORDER — MOVIPREP 100 G PO SOLR: 1.0000 | Freq: Once | ORAL | Status: DC

## 2013-11-24 ENCOUNTER — Telehealth: Payer: Self-pay

## 2013-11-24 DIAGNOSIS — I129 Hypertensive chronic kidney disease with stage 1 through stage 4 chronic kidney disease, or unspecified chronic kidney disease: Secondary | ICD-10-CM | POA: Diagnosis not present

## 2013-11-30 ENCOUNTER — Encounter: Payer: Self-pay | Admitting: Internal Medicine

## 2013-11-30 ENCOUNTER — Ambulatory Visit: Payer: No Typology Code available for payment source | Attending: Internal Medicine | Admitting: Internal Medicine

## 2013-11-30 VITALS — BP 103/71 | HR 76 | Temp 98.1°F | Resp 16 | Ht 64.0 in | Wt 157.0 lb

## 2013-11-30 DIAGNOSIS — I10 Essential (primary) hypertension: Secondary | ICD-10-CM

## 2013-11-30 DIAGNOSIS — N189 Chronic kidney disease, unspecified: Secondary | ICD-10-CM | POA: Insufficient documentation

## 2013-11-30 DIAGNOSIS — I129 Hypertensive chronic kidney disease with stage 1 through stage 4 chronic kidney disease, or unspecified chronic kidney disease: Secondary | ICD-10-CM | POA: Insufficient documentation

## 2013-11-30 DIAGNOSIS — K219 Gastro-esophageal reflux disease without esophagitis: Secondary | ICD-10-CM

## 2013-11-30 DIAGNOSIS — N182 Chronic kidney disease, stage 2 (mild): Secondary | ICD-10-CM

## 2013-11-30 DIAGNOSIS — E039 Hypothyroidism, unspecified: Secondary | ICD-10-CM

## 2013-11-30 MED ORDER — OMEPRAZOLE 40 MG PO CPDR: 40.0000 mg | DELAYED_RELEASE_CAPSULE | Freq: Every day | ORAL | Status: DC

## 2013-11-30 MED ORDER — CARVEDILOL 3.125 MG PO TABS: 3.1250 mg | ORAL_TABLET | Freq: Two times a day (BID) | ORAL | Status: DC

## 2013-11-30 MED ORDER — ASPIRIN EC 81 MG PO TBEC: 81.0000 mg | DELAYED_RELEASE_TABLET | Freq: Every day | ORAL | Status: DC

## 2013-11-30 MED ORDER — LEVOTHYROXINE SODIUM 50 MCG PO TABS: 50.0000 ug | ORAL_TABLET | Freq: Every day | ORAL | Status: DC

## 2013-11-30 NOTE — Patient Instructions (Signed)

## 2013-11-30 NOTE — Progress Notes (Signed)
Pt is here to follow up on his HTN. Pt also needs a refill on his medications.
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
## 2013-12-02 ENCOUNTER — Ambulatory Visit (AMBULATORY_SURGERY_CENTER): Payer: Self-pay | Admitting: Internal Medicine

## 2013-12-02 ENCOUNTER — Encounter: Payer: Self-pay | Admitting: Internal Medicine

## 2013-12-02 VITALS — BP 128/74 | HR 62 | Temp 97.4°F | Resp 23 | Ht 64.0 in | Wt 160.0 lb

## 2013-12-02 DIAGNOSIS — D126 Benign neoplasm of colon, unspecified: Secondary | ICD-10-CM

## 2013-12-02 DIAGNOSIS — Z1211 Encounter for screening for malignant neoplasm of colon: Secondary | ICD-10-CM

## 2013-12-02 DIAGNOSIS — K635 Polyp of colon: Secondary | ICD-10-CM

## 2013-12-02 DIAGNOSIS — Z800 Family history of malignant neoplasm of digestive organs: Secondary | ICD-10-CM

## 2013-12-02 HISTORY — DX: Polyp of colon: K63.5

## 2013-12-02 MED ORDER — SODIUM CHLORIDE 0.9 % IV SOLN: 500.0000 mL | INTRAVENOUS | Status: DC

## 2013-12-02 NOTE — Op Note (Signed)
[place]  [address] [place], [id number]   COLONOSCOPY PROCEDURE REPORT  PATIENT: George, Robbins.  MR#: [id number] BIRTHDATE: 24-Jul-1951 , 62  yrs. old GENDER: Male ENDOSCOPIST: Jerene Bears, MD REFERRED BY: Angelica Chessman, MD PROCEDURE DATE:  12/02/2013 PROCEDURE: First Screening Colonoscopy - Avg.  risk and is 50 yrs.  old or older Yes.  Prior Negative Screening - Now for repeat screening. N/A  History of Adenoma - Now for follow-up colonoscopy & has been > or = to 3 yrs.  N/A  Polyps Removed Today? Yes. ASA CLASS:   Class III INDICATIONS:average risk screening and first colonoscopy. MEDICATIONS: MAC sedation, administered by CRNA and propofol (Diprivan) 150mg  IV  DESCRIPTION OF PROCEDURE:   After the risks benefits and alternatives of the procedure were thoroughly explained, informed consent was obtained.  A digital rectal exam revealed no rectal mass.   The [id number]  endoscope was introduced through the anus and advanced to the cecum, which was identified by both the appendix and ileocecal valve. No adverse events experienced. The quality of the prep was good, using MoviPrep  The instrument was then slowly withdrawn as the colon was fully examined.   COLON FINDINGS: A flat polyp measuring 10 mm in size was found at the cecum.  A polypectomy was performed using snare cautery.  The resection was complete and the polyp tissue was completely retrieved.   A small sessile polyp, measuring 4 mm in size, was found in the ascending colon.  A polypectomy was performed with cold forceps.  The resection was complete and the polyp tissue was completely retrieved.  Retroflexed views revealed small external hemorrhoids. The time to cecum=2 minutes 05 seconds.  Withdrawal time=17 minutes 36 seconds.  The scope was withdrawn and the procedure completed. COMPLICATIONS: There were no complications.  ENDOSCOPIC IMPRESSION: 1.   Flat polyp measuring  10 mm in size was found at the cecum; polypectomy was performed using snare cautery 2.   Small sessile polyp, measuring 4 mm in size, was found in the ascending colon; polypectomy was performed with cold forceps  RECOMMENDATIONS: 1.  Hold aspirin, aspirin products, and anti-inflammatory medication for 2 weeks. 2.  Await pathology results 3.  Repeat Colonoscopy in 3 years. 4.  You will receive a letter within 1-2 weeks with the results of your biopsy as well as final recommendations.  Please call my office if you have not received a letter after 3 weeks.   eSigned:  Jerene Bears, MD 12/02/2013 2:10 PM     cc: The Patient; Angelica Chessman, MD

## 2013-12-02 NOTE — Progress Notes (Signed)
Called to room to assist during endoscopic procedure.  Patient ID and intended procedure confirmed with present staff. Received instructions for my participation in the procedure from the performing physician.  

## 2013-12-02 NOTE — Progress Notes (Signed)
Lidocaine-40mg IV prior to Propofol InductionPropofol given over incremental dosages 

## 2013-12-02 NOTE — Patient Instructions (Signed)
Impressions/recommendations:  Polyp (handout given)  Hold aspirin, aspirin containing products or anti-inflammatory medications for 2 weeks. May resume 12/17/13.  Repeat colonoscopy in 3 years.   YOU HAD AN ENDOSCOPIC PROCEDURE TODAY AT [facility]: Refer to the procedure report that was given to you for any specific questions about what was found during the examination.  If the procedure report does not answer your questions, please call your gastroenterologist to clarify.  If you requested that your care partner not be given the details of your procedure findings, then the procedure report has been included in a sealed envelope for you to review at your convenience later.  YOU SHOULD EXPECT: Some feelings of bloating in the abdomen. Passage of more gas than usual.  Walking can help get rid of the air that was put into your GI tract during the procedure and reduce the bloating. If you had a lower endoscopy (such as a colonoscopy or flexible sigmoidoscopy) you may notice spotting of blood in your stool or on the toilet paper. If you underwent a bowel prep for your procedure, then you may not have a normal bowel movement for a few days.  DIET: Your first meal following the procedure should be a light meal and then it is ok to progress to your normal diet.  A half-sandwich or bowl of soup is an example of a good first meal.  Heavy or fried foods are harder to digest and may make you feel nauseous or bloated.  Likewise meals heavy in dairy and vegetables can cause extra gas to form and this can also increase the bloating.  Drink plenty of fluids but you should avoid alcoholic beverages for 24 hours.  ACTIVITY: Your care partner should take you home directly after the procedure.  You should plan to take it easy, moving slowly for the rest of the day.  You can resume normal activity the day after the procedure however you should NOT DRIVE or use heavy machinery for 24 hours (because of the  sedation medicines used during the test).    SYMPTOMS TO REPORT IMMEDIATELY: A gastroenterologist can be reached at any hour.  During normal business hours, 8:30 AM to 5:00 PM Monday through Friday, call [phone].  After hours and on weekends, please call the GI answering service at [phone] who will take a message and have the physician on call contact you.   Following lower endoscopy (colonoscopy or flexible sigmoidoscopy):  Excessive amounts of blood in the stool  Significant tenderness or worsening of abdominal pains  Swelling of the abdomen that is new, acute  Fever of 100F or higher  FOLLOW UP: If any biopsies were taken you will be contacted by phone or by letter within the next 1-3 weeks.  Call your gastroenterologist if you have not heard about the biopsies in 3 weeks.  Our staff will call the home number listed on your records the next business day following your procedure to check on you and address any questions or concerns that you may have at that time regarding the information given to you following your procedure. This is a courtesy call and so if there is no answer at the home number and we have not heard from you through the emergency physician on call, we will assume that you have returned to your regular daily activities without incident.  SIGNATURES/CONFIDENTIALITY: You and/or your care partner have signed paperwork which will be entered into your electronic medical record.  These signatures  attest to the fact that that the information above on your After Visit Summary has been reviewed and is understood.  Full responsibility of the confidentiality of this discharge information lies with you and/or your care-partner.

## 2013-12-03 ENCOUNTER — Telehealth: Payer: Self-pay | Admitting: *Deleted

## 2013-12-03 NOTE — Telephone Encounter (Signed)
  Follow up Call-  Call back number 12/02/2013  Post procedure Call Back phone  # [phone]  Permission to leave phone message Yes  comments pt does not have answering machine     Patient questions:  Do you have a fever, pain , or abdominal swelling? no Pain Score  0 *  Have you tolerated food without any problems? yes  Have you been able to return to your normal activities? yes  Do you have any questions about your discharge instructions: Diet   no Medications  no Follow up visit  no  Do you have questions or concerns about your Care? no  Actions: * If pain score is 4 or above: No action needed, pain <4.

## 2013-12-09 ENCOUNTER — Encounter: Payer: Self-pay | Admitting: Internal Medicine

## 2014-02-09 DIAGNOSIS — D649 Anemia, unspecified: Secondary | ICD-10-CM | POA: Diagnosis not present

## 2014-02-09 DIAGNOSIS — D696 Thrombocytopenia, unspecified: Secondary | ICD-10-CM | POA: Diagnosis not present

## 2014-02-09 DIAGNOSIS — Z87891 Personal history of nicotine dependence: Secondary | ICD-10-CM | POA: Diagnosis not present

## 2014-02-09 DIAGNOSIS — E039 Hypothyroidism, unspecified: Secondary | ICD-10-CM | POA: Diagnosis not present

## 2014-02-09 DIAGNOSIS — I129 Hypertensive chronic kidney disease with stage 1 through stage 4 chronic kidney disease, or unspecified chronic kidney disease: Secondary | ICD-10-CM | POA: Diagnosis not present

## 2014-02-09 DIAGNOSIS — N183 Chronic kidney disease, stage 3 unspecified: Secondary | ICD-10-CM | POA: Diagnosis not present

## 2014-02-09 DIAGNOSIS — K219 Gastro-esophageal reflux disease without esophagitis: Secondary | ICD-10-CM | POA: Diagnosis not present

## 2014-02-09 DIAGNOSIS — R809 Proteinuria, unspecified: Secondary | ICD-10-CM | POA: Diagnosis not present

## 2014-02-09 DIAGNOSIS — N182 Chronic kidney disease, stage 2 (mild): Secondary | ICD-10-CM | POA: Diagnosis not present

## 2014-02-11 DIAGNOSIS — N183 Chronic kidney disease, stage 3 unspecified: Secondary | ICD-10-CM | POA: Diagnosis not present

## 2014-03-01 ENCOUNTER — Encounter: Payer: Self-pay | Admitting: Internal Medicine

## 2014-03-01 ENCOUNTER — Ambulatory Visit: Payer: Medicare Other | Attending: Internal Medicine | Admitting: Internal Medicine

## 2014-03-01 VITALS — BP 106/71 | HR 82 | Temp 97.7°F | Resp 16 | Ht 64.0 in | Wt 168.0 lb

## 2014-03-01 DIAGNOSIS — Z79899 Other long term (current) drug therapy: Secondary | ICD-10-CM | POA: Diagnosis not present

## 2014-03-01 DIAGNOSIS — Z7982 Long term (current) use of aspirin: Secondary | ICD-10-CM | POA: Diagnosis not present

## 2014-03-01 DIAGNOSIS — E039 Hypothyroidism, unspecified: Secondary | ICD-10-CM | POA: Insufficient documentation

## 2014-03-01 DIAGNOSIS — R059 Cough, unspecified: Secondary | ICD-10-CM | POA: Diagnosis not present

## 2014-03-01 DIAGNOSIS — Z09 Encounter for follow-up examination after completed treatment for conditions other than malignant neoplasm: Secondary | ICD-10-CM | POA: Insufficient documentation

## 2014-03-01 DIAGNOSIS — N183 Chronic kidney disease, stage 3 unspecified: Secondary | ICD-10-CM | POA: Diagnosis not present

## 2014-03-01 DIAGNOSIS — R05 Cough: Secondary | ICD-10-CM | POA: Insufficient documentation

## 2014-03-01 DIAGNOSIS — I129 Hypertensive chronic kidney disease with stage 1 through stage 4 chronic kidney disease, or unspecified chronic kidney disease: Secondary | ICD-10-CM | POA: Insufficient documentation

## 2014-03-01 DIAGNOSIS — I10 Essential (primary) hypertension: Secondary | ICD-10-CM

## 2014-03-01 DIAGNOSIS — R0609 Other forms of dyspnea: Secondary | ICD-10-CM | POA: Insufficient documentation

## 2014-03-01 DIAGNOSIS — R053 Chronic cough: Secondary | ICD-10-CM

## 2014-03-01 DIAGNOSIS — R0989 Other specified symptoms and signs involving the circulatory and respiratory systems: Secondary | ICD-10-CM | POA: Diagnosis not present

## 2014-03-01 MED ORDER — GUAIFENESIN-DM 100-10 MG/5ML PO SYRP: 5.0000 mL | ORAL_SOLUTION | ORAL | Status: DC | PRN

## 2014-03-01 MED ORDER — DOXYCYCLINE HYCLATE 100 MG PO TABS: 100.0000 mg | ORAL_TABLET | Freq: Two times a day (BID) | ORAL | Status: DC

## 2014-03-01 NOTE — Progress Notes (Signed)
Pt is here following up on his HTN. Pt reports having a cough that has caused chest pain.

## 2014-03-01 NOTE — Patient Instructions (Signed)
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
## 2014-03-19 ENCOUNTER — Ambulatory Visit: Payer: Medicare Other | Attending: Internal Medicine | Admitting: Internal Medicine

## 2014-03-19 ENCOUNTER — Encounter: Payer: Self-pay | Admitting: Internal Medicine

## 2014-03-19 VITALS — BP 129/84 | HR 84 | Temp 98.5°F | Resp 14 | Ht 64.0 in | Wt 169.0 lb

## 2014-03-19 DIAGNOSIS — R05 Cough: Secondary | ICD-10-CM

## 2014-03-19 DIAGNOSIS — Z87891 Personal history of nicotine dependence: Secondary | ICD-10-CM | POA: Insufficient documentation

## 2014-03-19 DIAGNOSIS — N183 Chronic kidney disease, stage 3 unspecified: Secondary | ICD-10-CM | POA: Diagnosis not present

## 2014-03-19 DIAGNOSIS — E039 Hypothyroidism, unspecified: Secondary | ICD-10-CM | POA: Diagnosis not present

## 2014-03-19 DIAGNOSIS — Z760 Encounter for issue of repeat prescription: Secondary | ICD-10-CM | POA: Insufficient documentation

## 2014-03-19 DIAGNOSIS — J309 Allergic rhinitis, unspecified: Secondary | ICD-10-CM | POA: Insufficient documentation

## 2014-03-19 DIAGNOSIS — R053 Chronic cough: Secondary | ICD-10-CM

## 2014-03-19 DIAGNOSIS — I129 Hypertensive chronic kidney disease with stage 1 through stage 4 chronic kidney disease, or unspecified chronic kidney disease: Secondary | ICD-10-CM | POA: Insufficient documentation

## 2014-03-19 DIAGNOSIS — K219 Gastro-esophageal reflux disease without esophagitis: Secondary | ICD-10-CM | POA: Insufficient documentation

## 2014-03-19 DIAGNOSIS — I10 Essential (primary) hypertension: Secondary | ICD-10-CM

## 2014-03-19 DIAGNOSIS — R059 Cough, unspecified: Secondary | ICD-10-CM

## 2014-03-19 MED ORDER — OMEPRAZOLE 40 MG PO CPDR: 40.0000 mg | DELAYED_RELEASE_CAPSULE | Freq: Every day | ORAL | Status: DC

## 2014-03-19 MED ORDER — ALBUTEROL SULFATE HFA 108 (90 BASE) MCG/ACT IN AERS: 2.0000 | INHALATION_SPRAY | Freq: Four times a day (QID) | RESPIRATORY_TRACT | Status: DC | PRN

## 2014-03-19 MED ORDER — CARVEDILOL 3.125 MG PO TABS: 3.1250 mg | ORAL_TABLET | Freq: Two times a day (BID) | ORAL | Status: DC

## 2014-03-19 MED ORDER — ASPIRIN EC 81 MG PO TBEC: 81.0000 mg | DELAYED_RELEASE_TABLET | Freq: Every day | ORAL | Status: DC

## 2014-03-19 MED ORDER — LEVOTHYROXINE SODIUM 50 MCG PO TABS: 50.0000 ug | ORAL_TABLET | Freq: Every day | ORAL | Status: DC

## 2014-03-19 MED ORDER — FLUTICASONE PROPIONATE 50 MCG/ACT NA SUSP: 2.0000 | Freq: Every day | NASAL | Status: DC

## 2014-03-19 MED ORDER — BECLOMETHASONE DIPROPIONATE 40 MCG/ACT IN AERS: 2.0000 | INHALATION_SPRAY | Freq: Two times a day (BID) | RESPIRATORY_TRACT | Status: DC

## 2014-03-19 MED ORDER — GUAIFENESIN-DM 100-10 MG/5ML PO SYRP: 5.0000 mL | ORAL_SOLUTION | ORAL | Status: DC | PRN

## 2014-03-19 NOTE — Patient Instructions (Signed)
Continue to use Omeprazole for control of acid reflux and night time symptoms.  Be sure to take medication at least 30 minutes before breakfast. Do not eat heavy meals and lay down, wait at least 2 hours before laying down.  Use Qvar inhaler twice daily to control cough and SOB. Use albuterol when needed every 6 hours, when having shortness of breath and coughing spells.  Continue cetrizine and use flonase nasal spray daily to help with allergy symptoms     How to Use an Inhaler Proper inhaler technique is very important. Good technique ensures that the medicine reaches the lungs. Poor technique results in depositing the medicine on the tongue and back of the throat rather than in the airways. If you do not use the inhaler with good technique, the medicine will not help you. STEPS TO FOLLOW IF USING AN INHALER WITHOUT AN EXTENSION TUBE 1. Remove the cap from the inhaler. 2. If you are using the inhaler for the first time, you will need to prime it. Shake the inhaler for 5 seconds and release four puffs into the air, away from your face. Ask your health care provider or pharmacist if you have questions about priming your inhaler. 3. Shake the inhaler for 5 seconds before each breath in (inhalation). 4. Position the inhaler so that the top of the canister faces up. 5. Put your index finger on the top of the medicine canister. Your thumb supports the bottom of the inhaler. 6. Open your mouth. 7. Either place the inhaler between your teeth and place your lips tightly around the mouthpiece, or hold the inhaler 1 2 inches away from your open mouth. If you are unsure of which technique to use, ask your health care provider. 8. Breathe out (exhale) normally and as completely as possible. 9. Press the canister down with your index finger to release the medicine. 10. At the same time as the canister is pressed, inhale deeply and slowly until your lungs are completely filled. This should take 4 6 seconds. Keep  your tongue down. 11. Hold the medicine in your lungs for 5 10 seconds (10 seconds is best). This helps the medicine get into the small airways of your lungs. 12. Breathe out slowly, through pursed lips. Whistling is an example of pursed lips. 13. Wait at least 15 30 seconds between puffs. Continue with the above steps until you have taken the number of puffs your health care provider has ordered. Do not use the inhaler more than your health care provider tells you. 14. Replace the cap on the inhaler. 15. Follow the directions from your health care provider or the inhaler insert for cleaning the inhaler. STEPS TO FOLLOW IF USING AN INHALER WITH AN EXTENSION (SPACER) 1. Remove the cap from the inhaler. 2. If you are using the inhaler for the first time, you will need to prime it. Shake the inhaler for 5 seconds and release four puffs into the air, away from your face. Ask your health care provider or pharmacist if you have questions about priming your inhaler. 3. Shake the inhaler for 5 seconds before each breath in (inhalation). 4. Place the open end of the spacer onto the mouthpiece of the inhaler. 5. Position the inhaler so that the top of the canister faces up and the spacer mouthpiece faces you. 6. Put your index finger on the top of the medicine canister. Your thumb supports the bottom of the inhaler and the spacer. 7. Breathe out (exhale) normally and  as completely as possible. 8. Immediately after exhaling, place the spacer between your teeth and into your mouth. Close your lips tightly around the spacer. 9. Press the canister down with your index finger to release the medicine. 10. At the same time as the canister is pressed, inhale deeply and slowly until your lungs are completely filled. This should take 4 6 seconds. Keep your tongue down and out of the way. 11. Hold the medicine in your lungs for 5 10 seconds (10 seconds is best). This helps the medicine get into the small airways of your  lungs. Exhale. 12. Repeat inhaling deeply through the spacer mouthpiece. Again hold that breath for up to 10 seconds (10 seconds is best). Exhale slowly. If it is difficult to take this second deep breath through the spacer, breathe normally several times through the spacer. Remove the spacer from your mouth. 13. Wait at least 15 30 seconds between puffs. Continue with the above steps until you have taken the number of puffs your health care provider has ordered. Do not use the inhaler more than your health care provider tells you. 14. Remove the spacer from the inhaler, and place the cap on the inhaler. 15. Follow the directions from your health care provider or the inhaler insert for cleaning the inhaler and spacer. If you are using different kinds of inhalers, use your quick relief medicine to open the airways 10 15 minutes before using a steroid if instructed to do so by your health care provider. If you are unsure which inhalers to use and the order of using them, ask your health care provider, nurse, or respiratory therapist. If you are using a steroid inhaler, always rinse your mouth with water after your last puff, then gargle and spit out the water. Do not swallow the water. AVOID:  Inhaling before or after starting the spray of medicine. It takes practice to coordinate your breathing with triggering the spray.  Inhaling through the nose (rather than the mouth) when triggering the spray. HOW TO DETERMINE IF YOUR INHALER IS FULL OR NEARLY EMPTY You cannot know when an inhaler is empty by shaking it. A few inhalers are now being made with dose counters. Ask your health care provider for a prescription that has a dose counter if you feel you need that extra help. If your inhaler does not have a counter, ask your health care provider to help you determine the date you need to refill your inhaler. Write the refill date on a calendar or your inhaler canister. Refill your inhaler 7 10 days before it  runs out. Be sure to keep an adequate supply of medicine. This includes making sure it is not expired, and that you have a spare inhaler.  SEEK MEDICAL CARE IF:   Your symptoms are only partially relieved with your inhaler.  You are having trouble using your inhaler.  You have some increase in phlegm. SEEK IMMEDIATE MEDICAL CARE IF:   You feel little or no relief with your inhalers. You are still wheezing and are feeling shortness of breath or tightness in your chest or both.  You have dizziness, headaches, or a fast heart rate.  You have chills, fever, or night sweats.  You have a noticeable increase in phlegm production, or there is blood in the phlegm. MAKE SURE YOU:   Understand these instructions.  Will watch your condition.  Will get help right away if you are not doing well or get worse. Document Released: 11/09/2000 Document  Revised: 09/02/2013 Document Reviewed: 06/11/2013 [facility] Patient Information 2014 [place], [place]. Bronchitis Bronchitis is inflammation of the airways that extend from the windpipe into the lungs (bronchi). The inflammation often causes mucus to develop, which leads to a cough. If the inflammation becomes severe, it may cause shortness of breath. CAUSES  Bronchitis may be caused by:   Viral infections.   Bacteria.   Cigarette smoke.   Allergens, pollutants, and other irritants.  SIGNS AND SYMPTOMS  The most common symptom of bronchitis is a frequent cough that produces mucus. Other symptoms include:  Fever.   Body aches.   Chest congestion.   Chills.   Shortness of breath.   Sore throat.  DIAGNOSIS  Bronchitis is usually diagnosed through a medical history and physical exam. Tests, such as chest X-rays, are sometimes done to rule out other conditions.  TREATMENT  You may need to avoid contact with whatever caused the problem (smoking, for example). Medicines are sometimes needed. These may include:  Antibiotics. These  may be prescribed if the condition is caused by bacteria.  Cough suppressants. These may be prescribed for relief of cough symptoms.   Inhaled medicines. These may be prescribed to help open your airways and make it easier for you to breathe.   Steroid medicines. These may be prescribed for those with recurrent (chronic) bronchitis. HOME CARE INSTRUCTIONS  Get plenty of rest.   Drink enough fluids to keep your urine clear or pale yellow (unless you have a medical condition that requires fluid restriction). Increasing fluids may help thin your secretions and will prevent dehydration.   Only take over-the-counter or prescription medicines as directed by your health care provider.  Only take antibiotics as directed. Make sure you finish them even if you start to feel better.  Avoid secondhand smoke, irritating chemicals, and strong fumes. These will make bronchitis worse. If you are a smoker, quit smoking. Consider using nicotine gum or skin patches to help control withdrawal symptoms. Quitting smoking will help your lungs heal faster.   Put a cool-mist humidifier in your bedroom at night to moisten the air. This may help loosen mucus. Change the water in the humidifier daily. You can also run the hot water in your shower and sit in the bathroom with the door closed for 5 10 minutes.   Follow up with your health care provider as directed.   Wash your hands frequently to avoid catching bronchitis again or spreading an infection to others.  SEEK MEDICAL CARE IF: Your symptoms do not improve after 1 week of treatment.  SEEK IMMEDIATE MEDICAL CARE IF:  Your fever increases.  You have chills.   You have chest pain.   You have worsening shortness of breath.   You have bloody sputum.  You faint.  You have lightheadedness.  You have a severe headache.   You vomit repeatedly. MAKE SURE YOU:   Understand these instructions.  Will watch your condition.  Will get  help right away if you are not doing well or get worse. Document Released: 11/12/2005 Document Revised: 09/02/2013 Document Reviewed: 07/07/2013 [facility] Patient Information 2014 [place].
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
## 2014-03-19 NOTE — Progress Notes (Signed)
Pt is still having trouble with his allergy's. Pt coughs so much that he vomits.

## 2014-03-29 ENCOUNTER — Encounter (HOSPITAL_COMMUNITY): Payer: Self-pay | Admitting: Emergency Medicine

## 2014-03-29 ENCOUNTER — Emergency Department (INDEPENDENT_AMBULATORY_CARE_PROVIDER_SITE_OTHER)
Admission: EM | Admit: 2014-03-29 | Discharge: 2014-03-29 | Disposition: A | Discharge status: Other Acute Inpatient Hospital | Payer: Medicare Other | Source: Home / Self Care | Attending: Family Medicine | Admitting: Family Medicine

## 2014-03-29 ENCOUNTER — Inpatient Hospital Stay (HOSPITAL_COMMUNITY)
Admission: EM | Admit: 2014-03-29 | Discharge: 2014-04-01 | DRG: 247 | Disposition: A | Discharge status: Home / Self Care | Payer: Medicare Other | Attending: Internal Medicine | Admitting: Internal Medicine

## 2014-03-29 ENCOUNTER — Emergency Department (HOSPITAL_COMMUNITY): Payer: Medicare Other

## 2014-03-29 DIAGNOSIS — I2510 Atherosclerotic heart disease of native coronary artery without angina pectoris: Principal | ICD-10-CM | POA: Diagnosis present

## 2014-03-29 DIAGNOSIS — I519 Heart disease, unspecified: Secondary | ICD-10-CM | POA: Diagnosis not present

## 2014-03-29 DIAGNOSIS — R079 Chest pain, unspecified: Secondary | ICD-10-CM | POA: Diagnosis not present

## 2014-03-29 DIAGNOSIS — I200 Unstable angina: Secondary | ICD-10-CM | POA: Diagnosis present

## 2014-03-29 DIAGNOSIS — N182 Chronic kidney disease, stage 2 (mild): Secondary | ICD-10-CM | POA: Diagnosis present

## 2014-03-29 DIAGNOSIS — K219 Gastro-esophageal reflux disease without esophagitis: Secondary | ICD-10-CM | POA: Diagnosis present

## 2014-03-29 DIAGNOSIS — R059 Cough, unspecified: Secondary | ICD-10-CM | POA: Diagnosis not present

## 2014-03-29 DIAGNOSIS — I10 Essential (primary) hypertension: Secondary | ICD-10-CM | POA: Diagnosis present

## 2014-03-29 DIAGNOSIS — E785 Hyperlipidemia, unspecified: Secondary | ICD-10-CM

## 2014-03-29 DIAGNOSIS — J9819 Other pulmonary collapse: Secondary | ICD-10-CM | POA: Diagnosis not present

## 2014-03-29 DIAGNOSIS — I498 Other specified cardiac arrhythmias: Secondary | ICD-10-CM | POA: Diagnosis not present

## 2014-03-29 DIAGNOSIS — N183 Chronic kidney disease, stage 3 unspecified: Secondary | ICD-10-CM | POA: Diagnosis present

## 2014-03-29 DIAGNOSIS — R001 Bradycardia, unspecified: Secondary | ICD-10-CM | POA: Diagnosis present

## 2014-03-29 DIAGNOSIS — R0609 Other forms of dyspnea: Secondary | ICD-10-CM

## 2014-03-29 DIAGNOSIS — I129 Hypertensive chronic kidney disease with stage 1 through stage 4 chronic kidney disease, or unspecified chronic kidney disease: Secondary | ICD-10-CM | POA: Diagnosis present

## 2014-03-29 DIAGNOSIS — E039 Hypothyroidism, unspecified: Secondary | ICD-10-CM | POA: Diagnosis present

## 2014-03-29 DIAGNOSIS — J189 Pneumonia, unspecified organism: Secondary | ICD-10-CM

## 2014-03-29 DIAGNOSIS — N19 Unspecified kidney failure: Secondary | ICD-10-CM

## 2014-03-29 DIAGNOSIS — Z955 Presence of coronary angioplasty implant and graft: Secondary | ICD-10-CM

## 2014-03-29 DIAGNOSIS — R05 Cough: Secondary | ICD-10-CM | POA: Diagnosis present

## 2014-03-29 DIAGNOSIS — Z9861 Coronary angioplasty status: Secondary | ICD-10-CM | POA: Diagnosis not present

## 2014-03-29 DIAGNOSIS — R053 Chronic cough: Secondary | ICD-10-CM | POA: Diagnosis present

## 2014-03-29 DIAGNOSIS — R0789 Other chest pain: Secondary | ICD-10-CM | POA: Diagnosis not present

## 2014-03-29 DIAGNOSIS — I25119 Atherosclerotic heart disease of native coronary artery with unspecified angina pectoris: Secondary | ICD-10-CM | POA: Insufficient documentation

## 2014-03-29 DIAGNOSIS — R55 Syncope and collapse: Secondary | ICD-10-CM

## 2014-03-29 DIAGNOSIS — R072 Precordial pain: Secondary | ICD-10-CM | POA: Diagnosis not present

## 2014-03-29 DIAGNOSIS — R0982 Postnasal drip: Secondary | ICD-10-CM

## 2014-03-29 LAB — CBC
HCT: 34.5 % — ABNORMAL LOW (ref 39.0–52.0)
HEMOGLOBIN: 11.5 g/dL — AB (ref 13.0–17.0)
MCH: 28.1 pg (ref 26.0–34.0)
MCHC: 33.3 g/dL (ref 30.0–36.0)
MCV: 84.4 fL (ref 78.0–100.0)
Platelets: 162 10*3/uL (ref 150–400)
RBC: 4.09 MIL/uL — ABNORMAL LOW (ref 4.22–5.81)
RDW: 16.8 % — ABNORMAL HIGH (ref 11.5–15.5)
WBC: 8.4 10*3/uL (ref 4.0–10.5)

## 2014-03-29 LAB — TROPONIN I: Troponin I: <0.3 ng/mL (ref <0.30)

## 2014-03-29 LAB — BASIC METABOLIC PANEL
BUN: 14 mg/dL (ref 6–23)
CO2: 27 meq/L (ref 19–32)
CREATININE: 1.45 mg/dL — AB (ref 0.50–1.35)
Calcium: 9.6 mg/dL (ref 8.4–10.5)
Chloride: 101 mEq/L (ref 96–112)
GFR calc Af Amer: 59 mL/min — ABNORMAL LOW (ref >90)
GFR calc non Af Amer: 51 mL/min — ABNORMAL LOW (ref >90)
GLUCOSE: 94 mg/dL (ref 70–99)
POTASSIUM: 4.2 meq/L (ref 3.7–5.3)
Sodium: 140 mEq/L (ref 137–147)

## 2014-03-29 LAB — PRO B NATRIURETIC PEPTIDE: PRO B NATRI PEPTIDE: 144.6 pg/mL — AB (ref 0–125)

## 2014-03-29 LAB — I-STAT TROPONIN, ED: Troponin i, poc: 0.01 ng/mL (ref 0.00–0.08)

## 2014-03-29 MED ORDER — FLUTICASONE PROPIONATE HFA 44 MCG/ACT IN AERO: 1.0000 | INHALATION_SPRAY | Freq: Two times a day (BID) | RESPIRATORY_TRACT | Status: DC

## 2014-03-29 MED ORDER — BENZONATATE 100 MG PO CAPS
100.0000 mg | ORAL_CAPSULE | Freq: Two times a day (BID) | ORAL | Status: DC | PRN
  Filled 2014-03-29: qty 1

## 2014-03-29 MED ORDER — DOCUSATE SODIUM 100 MG PO CAPS
100.0000 mg | ORAL_CAPSULE | Freq: Two times a day (BID) | ORAL | Status: DC
Start: 2014-03-29 — End: 2014-04-01
  Administered 2014-03-29 – 2014-04-01 (×6): 100 mg via ORAL
  Filled 2014-03-29 (×8): qty 1

## 2014-03-29 MED ORDER — MORPHINE SULFATE 2 MG/ML IJ SOLN
2.0000 mg | Freq: Once | INTRAMUSCULAR | Status: AC
  Administered 2014-03-29: 2 mg via INTRAVENOUS
  Filled 2014-03-29: qty 1

## 2014-03-29 MED ORDER — NITROGLYCERIN 0.4 MG SL SUBL: 0.4000 mg | SUBLINGUAL_TABLET | SUBLINGUAL | Status: DC | PRN

## 2014-03-29 MED ORDER — SODIUM CHLORIDE 0.9 % IJ SOLN
3.0000 mL | Freq: Two times a day (BID) | INTRAMUSCULAR | Status: DC
  Administered 2014-03-29 – 2014-03-31 (×3): 3 mL via INTRAVENOUS

## 2014-03-29 MED ORDER — FLUTICASONE PROPIONATE 50 MCG/ACT NA SUSP
2.0000 | Freq: Every day | NASAL | Status: DC
  Administered 2014-03-30 – 2014-03-31 (×2): 2 via NASAL
  Filled 2014-03-29: qty 16

## 2014-03-29 MED ORDER — HEPARIN BOLUS VIA INFUSION
4000.0000 [IU] | Freq: Once | INTRAVENOUS | Status: AC
  Administered 2014-03-29: 4000 [IU] via INTRAVENOUS
  Filled 2014-03-29: qty 4000

## 2014-03-29 MED ORDER — ASPIRIN EC 81 MG PO TBEC
81.0000 mg | DELAYED_RELEASE_TABLET | Freq: Every day | ORAL | Status: DC
  Administered 2014-03-30 – 2014-04-01 (×3): 81 mg via ORAL
  Filled 2014-03-29 (×3): qty 1

## 2014-03-29 MED ORDER — HEPARIN (PORCINE) IN NACL 100-0.45 UNIT/ML-% IJ SOLN
800.0000 [IU]/h | INTRAMUSCULAR | Status: DC
  Administered 2014-03-29 – 2014-03-30 (×3): 900 [IU]/h via INTRAVENOUS
  Filled 2014-03-29 (×2): qty 250

## 2014-03-29 MED ORDER — PANTOPRAZOLE SODIUM 40 MG PO TBEC
40.0000 mg | DELAYED_RELEASE_TABLET | Freq: Every day | ORAL | Status: DC
  Administered 2014-03-30 – 2014-04-01 (×3): 40 mg via ORAL
  Filled 2014-03-29 (×3): qty 1

## 2014-03-29 MED ORDER — ONDANSETRON HCL 4 MG PO TABS: 4.0000 mg | ORAL_TABLET | Freq: Four times a day (QID) | ORAL | Status: DC | PRN

## 2014-03-29 MED ORDER — HYDROCODONE-ACETAMINOPHEN 5-325 MG PO TABS
1.0000 | ORAL_TABLET | ORAL | Status: DC | PRN
  Administered 2014-03-30: 1 via ORAL

## 2014-03-29 MED ORDER — LEVOTHYROXINE SODIUM 50 MCG PO TABS
50.0000 ug | ORAL_TABLET | Freq: Every day | ORAL | Status: DC
  Filled 2014-03-29 (×2): qty 1

## 2014-03-29 MED ORDER — LEVOFLOXACIN 750 MG PO TABS
750.0000 mg | ORAL_TABLET | Freq: Every day | ORAL | Status: DC
  Filled 2014-03-29: qty 1

## 2014-03-29 MED ORDER — SODIUM CHLORIDE 0.9 % IV SOLN
Freq: Once | INTRAVENOUS | Status: AC
  Administered 2014-03-29: 18:00:00 via INTRAVENOUS

## 2014-03-29 MED ORDER — MORPHINE SULFATE 2 MG/ML IJ SOLN: 2.0000 mg | INTRAMUSCULAR | Status: DC | PRN

## 2014-03-29 MED ORDER — ACETAMINOPHEN 650 MG RE SUPP: 650.0000 mg | Freq: Four times a day (QID) | RECTAL | Status: DC | PRN

## 2014-03-29 MED ORDER — ASPIRIN 81 MG PO CHEW
324.0000 mg | CHEWABLE_TABLET | Freq: Once | ORAL | Status: AC
  Administered 2014-03-29: 324 mg via ORAL
  Filled 2014-03-29: qty 4

## 2014-03-29 MED ORDER — GUAIFENESIN-DM 100-10 MG/5ML PO SYRP
5.0000 mL | ORAL_SOLUTION | ORAL | Status: DC | PRN
  Filled 2014-03-29: qty 5

## 2014-03-29 MED ORDER — ONDANSETRON HCL 4 MG/2ML IJ SOLN: 4.0000 mg | Freq: Four times a day (QID) | INTRAMUSCULAR | Status: DC | PRN

## 2014-03-29 MED ORDER — CARVEDILOL 3.125 MG PO TABS
3.1250 mg | ORAL_TABLET | Freq: Two times a day (BID) | ORAL | Status: DC
  Filled 2014-03-29 (×3): qty 1

## 2014-03-29 MED ORDER — ACETAMINOPHEN 325 MG PO TABS
650.0000 mg | ORAL_TABLET | Freq: Four times a day (QID) | ORAL | Status: DC | PRN
  Administered 2014-03-31: 650 mg via ORAL
  Filled 2014-03-29: qty 2

## 2014-03-29 MED ORDER — ALBUTEROL SULFATE (2.5 MG/3ML) 0.083% IN NEBU: 3.0000 mL | INHALATION_SOLUTION | Freq: Four times a day (QID) | RESPIRATORY_TRACT | Status: DC | PRN

## 2014-03-29 MED ORDER — FLUTICASONE PROPIONATE HFA 44 MCG/ACT IN AERO
1.0000 | INHALATION_SPRAY | Freq: Two times a day (BID) | RESPIRATORY_TRACT | Status: DC
  Administered 2014-03-30 – 2014-04-01 (×5): 1 via RESPIRATORY_TRACT
  Filled 2014-03-29 (×2): qty 10.6
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
## 2014-03-29 NOTE — ED Provider Notes (Signed)
Medical screening examination/treatment/procedure(s) were conducted as a shared visit with non-physician practitioner(s) and myself.  I personally evaluated the patient during the encounter.   EKG Interpretation   Date/Time:  Monday Mar 29 2014 18:17:58 EDT Ventricular Rate:  89 PR Interval:  148 QRS Duration: 86 QT Interval:  340 QTC Calculation: 413 R Axis:   15 Text Interpretation:  Normal sinus rhythm Moderate voltage criteria for  LVH, may be normal variant Nonspecific T wave abnormality Abnormal ECG  Poor data quality No significant change since last tracing Confirmed by  [facility]  MD, MICHEAL ([id number]) on 03/29/2014 7:05:18 PM      Pt with exertional CP yesterday while playing the drums, occurred again today with walking.  Currently pain free.  Non specific ECG, no sig change.  Pt had admission for non cardiac elevation of cardiac markers in June 2009, has not seen cardiology since.  Will admit for formal rule out, consideration for functional study during hospitalization.  Lungs clear.  First troponin is neg.         Saddie Benders. Dorna Mai, MD 03/29/14 2028

## 2014-03-29 NOTE — ED Notes (Signed)
States he had pain in his chest yesterday while he was playing the drums in church, and thought he was a Music therapist, but felt better after the pastor prayed over him. Had another episode today, and wanted to be checked out. This pain is a lot like it was for his stomach when it was cutting up, but that medicine isnt helping at all

## 2014-03-29 NOTE — ED Notes (Signed)
Glasses and hearing aid sent up with the patient.  Family has the patient's wallet.
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
## 2014-03-29 NOTE — ED Notes (Signed)
Attempted to call report

## 2014-03-29 NOTE — ED Notes (Signed)
Patient ambulated to restroom and started having chest pain during ambulation.  10/10 chest pain, still experiencing pain while back in bed.  Placed on O2 nasal cannula at 2L.

## 2014-03-29 NOTE — ED Notes (Signed)
Presents with chest pain in central chest began while playing drums yesterday, worse with exertion, felt better after rest and prayer, reports second episode today while exerting self. Reports pain is worse with exertion associated with nausea, SOB and productive cough with white phlegm.

## 2014-03-29 NOTE — ED Notes (Signed)
Internal med doctor at the bedside to assess patient.  Level of care changed, called house to let them know change.

## 2014-03-29 NOTE — ED Notes (Signed)
Hospitalist paged for chest pain management.
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
## 2014-03-29 NOTE — ED Notes (Signed)
Contacted Internal Med MD about patient's chest pain.  She orders repeat EKG, repeat troponin, and will order morphine for management.

## 2014-03-29 NOTE — ED Notes (Signed)
Contacted phlebotomy about new troponin lab order.

## 2014-03-29 NOTE — ED Notes (Signed)
Portable chest x-ray at the bedside.  

## 2014-03-29 NOTE — ED Notes (Signed)
I gave the patient a cup of ice water. 

## 2014-03-30 ENCOUNTER — Observation Stay (HOSPITAL_COMMUNITY): Payer: Medicare Other

## 2014-03-30 DIAGNOSIS — E039 Hypothyroidism, unspecified: Secondary | ICD-10-CM

## 2014-03-30 DIAGNOSIS — N183 Chronic kidney disease, stage 3 unspecified: Secondary | ICD-10-CM

## 2014-03-30 DIAGNOSIS — R0982 Postnasal drip: Secondary | ICD-10-CM | POA: Diagnosis not present

## 2014-03-30 DIAGNOSIS — I10 Essential (primary) hypertension: Secondary | ICD-10-CM | POA: Diagnosis not present

## 2014-03-30 DIAGNOSIS — N182 Chronic kidney disease, stage 2 (mild): Secondary | ICD-10-CM | POA: Diagnosis not present

## 2014-03-30 DIAGNOSIS — R072 Precordial pain: Secondary | ICD-10-CM

## 2014-03-30 DIAGNOSIS — I519 Heart disease, unspecified: Secondary | ICD-10-CM

## 2014-03-30 DIAGNOSIS — I2510 Atherosclerotic heart disease of native coronary artery without angina pectoris: Secondary | ICD-10-CM | POA: Diagnosis not present

## 2014-03-30 DIAGNOSIS — I200 Unstable angina: Secondary | ICD-10-CM | POA: Diagnosis not present

## 2014-03-30 DIAGNOSIS — R079 Chest pain, unspecified: Secondary | ICD-10-CM

## 2014-03-30 DIAGNOSIS — R001 Bradycardia, unspecified: Secondary | ICD-10-CM | POA: Diagnosis present

## 2014-03-30 LAB — COMPREHENSIVE METABOLIC PANEL
ALBUMIN: 3.7 g/dL (ref 3.5–5.2)
ALT: 11 U/L (ref 0–53)
AST: 18 U/L (ref 0–37)
Alkaline Phosphatase: 63 U/L (ref 39–117)
BILIRUBIN TOTAL: 0.3 mg/dL (ref 0.3–1.2)
BUN: 12 mg/dL (ref 6–23)
CHLORIDE: 103 meq/L (ref 96–112)
CO2: 25 mEq/L (ref 19–32)
CREATININE: 1.26 mg/dL (ref 0.50–1.35)
Calcium: 9.4 mg/dL (ref 8.4–10.5)
GFR calc Af Amer: 69 mL/min — ABNORMAL LOW (ref >90)
GFR calc non Af Amer: 60 mL/min — ABNORMAL LOW (ref >90)
Glucose, Bld: 93 mg/dL (ref 70–99)
Potassium: 4.1 mEq/L (ref 3.7–5.3)
Sodium: 141 mEq/L (ref 137–147)
TOTAL PROTEIN: 6.9 g/dL (ref 6.0–8.3)

## 2014-03-30 LAB — URINALYSIS, ROUTINE W REFLEX MICROSCOPIC
Bilirubin Urine: NEGATIVE
Glucose, UA: NEGATIVE mg/dL
HGB URINE DIPSTICK: NEGATIVE
Ketones, ur: NEGATIVE mg/dL
LEUKOCYTES UA: NEGATIVE
Nitrite: NEGATIVE
PH: 6.5 (ref 5.0–8.0)
Protein, ur: NEGATIVE mg/dL
SPECIFIC GRAVITY, URINE: 1.011 (ref 1.005–1.030)
Urobilinogen, UA: 0.2 mg/dL (ref 0.0–1.0)

## 2014-03-30 LAB — CBC
HEMATOCRIT: 33.6 % — AB (ref 39.0–52.0)
Hemoglobin: 11.2 g/dL — ABNORMAL LOW (ref 13.0–17.0)
MCH: 27.7 pg (ref 26.0–34.0)
MCHC: 33.3 g/dL (ref 30.0–36.0)
MCV: 83 fL (ref 78.0–100.0)
Platelets: 159 10*3/uL (ref 150–400)
RBC: 4.05 MIL/uL — ABNORMAL LOW (ref 4.22–5.81)
RDW: 16.6 % — ABNORMAL HIGH (ref 11.5–15.5)
WBC: 8.1 10*3/uL (ref 4.0–10.5)

## 2014-03-30 LAB — MAGNESIUM: Magnesium: 2 mg/dL (ref 1.5–2.5)

## 2014-03-30 LAB — PHOSPHORUS: PHOSPHORUS: 3.4 mg/dL (ref 2.3–4.6)

## 2014-03-30 LAB — TSH: TSH: 88.46 u[IU]/mL — AB (ref 0.350–4.500)

## 2014-03-30 LAB — TROPONIN I
Troponin I: <0.3 ng/mL (ref <0.30)
Troponin I: <0.3 ng/mL (ref <0.30)

## 2014-03-30 LAB — HEPARIN LEVEL (UNFRACTIONATED)
HEPARIN UNFRACTIONATED: 0.67 [IU]/mL (ref 0.30–0.70)
Heparin Unfractionated: 0.74 IU/mL — ABNORMAL HIGH (ref 0.30–0.70)

## 2014-03-30 LAB — MRSA PCR SCREENING: MRSA by PCR: NEGATIVE

## 2014-03-30 MED ORDER — TECHNETIUM TC 99M SESTAMIBI GENERIC - CARDIOLITE
10.0000 | Freq: Once | INTRAVENOUS | Status: AC | PRN
  Administered 2014-03-30: 10 via INTRAVENOUS

## 2014-03-30 MED ORDER — REGADENOSON 0.4 MG/5ML IV SOLN
INTRAVENOUS | Status: AC
  Filled 2014-03-30: qty 5

## 2014-03-30 MED ORDER — REGADENOSON 0.4 MG/5ML IV SOLN
0.4000 mg | Freq: Once | INTRAVENOUS | Status: AC
  Administered 2014-03-30: 0.4 mg via INTRAVENOUS

## 2014-03-30 MED ORDER — HYDROCODONE-ACETAMINOPHEN 5-325 MG PO TABS
ORAL_TABLET | ORAL | Status: AC
  Administered 2014-03-30: 1 via ORAL
  Filled 2014-03-30: qty 1

## 2014-03-30 MED ORDER — LEVOTHYROXINE SODIUM 100 MCG PO TABS
100.0000 ug | ORAL_TABLET | Freq: Every day | ORAL | Status: DC
  Administered 2014-03-30 – 2014-04-01 (×3): 100 ug via ORAL
  Filled 2014-03-30 (×5): qty 1

## 2014-03-30 MED ORDER — TECHNETIUM TC 99M SESTAMIBI GENERIC - CARDIOLITE
30.0000 | Freq: Once | INTRAVENOUS | Status: AC | PRN
  Administered 2014-03-30: 30 via INTRAVENOUS

## 2014-03-30 MED ORDER — FAMOTIDINE 20 MG PO TABS
20.0000 mg | ORAL_TABLET | Freq: Two times a day (BID) | ORAL | Status: DC | PRN
  Administered 2014-03-30: 20 mg via ORAL
  Filled 2014-03-30: qty 1

## 2014-03-30 MED ORDER — SIMETHICONE 80 MG PO CHEW
160.0000 mg | CHEWABLE_TABLET | Freq: Four times a day (QID) | ORAL | Status: DC | PRN
  Administered 2014-03-30: 160 mg via ORAL
  Filled 2014-03-30: qty 2

## 2014-03-30 NOTE — Progress Notes (Signed)
Echocardiogram 2D Echocardiogram has been performed.  Alyson Locket Yvett Rossel 03/30/2014, 8:57 AM
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
## 2014-03-30 NOTE — Progress Notes (Signed)
Pt received into room 2w02, pt is no pain, resting in bed, Tele placed on pt, pt oriented to room and call bell, nuc med called for pt and waiting pickup for stress test, will continue to monitor Rickard Rhymes, RN
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
## 2014-03-30 NOTE — Progress Notes (Signed)
    Subjective:  No chest pain at rest.  Objective:  Vital Signs in the last 24 hours: Temp:  [98 F (36.7 C)-98.5 F (36.9 C)] 98.1 F (36.7 C) (05/05 0745) Pulse Rate:  [51-84] 66 (05/05 0745) Resp:  [14-28] 16 (05/05 0745) BP: (119-146)/(70-87) 133/76 mmHg (05/05 0745) SpO2:  [94 %-99 %] 97 % (05/05 0745) Weight:  [160 lb 6.4 oz (72.757 kg)-169 lb 12.1 oz (77 kg)] 164 lb 0.4 oz (74.4 kg) (05/05 0340)  Intake/Output from previous day:  Intake/Output Summary (Last 24 hours) at 03/30/14 1054 Last data filed at 03/30/14 1006  Gross per 24 hour  Intake  95.25 ml  Output    600 ml  Net -504.75 ml    Physical Exam: General appearance: alert, cooperative and no distress Neck: no carotid bruit and no JVD Lungs: clear to auscultation bilaterally Heart: regular rate and rhythm   Rate: 52  Rhythm: normal sinus rhythm and sinus bradycardia  Lab Results:  Recent Labs  03/29/14 1900 03/30/14 0356  WBC 8.4 8.1  HGB 11.5* 11.2*  PLT 162 159    Recent Labs  03/29/14 1900 03/30/14 0356  NA 140 141  K 4.2 4.1  CL 101 103  CO2 27 25  GLUCOSE 94 93  BUN 14 12  CREATININE 1.45* 1.26    Recent Labs  03/30/14 0356 03/30/14 0956  TROPONINI <0.30 <0.30   No results found for this basename: INR,  in the last 72 hours  Imaging: Imaging results have been reviewed  Cardiac Studies:  Assessment/Plan:  63 yr old male with hx of HTN , CKD Hypothyroidism admitted with chest pain. Troponin negative x 3. His TSH came back high -88. With CRI and negative Troponin will proceed with Myoview as opposed to cath.    Principal Problem:   Chest pain with moderate risk of acute coronary syndrome Active Problems:   CKD stage 3 with baseline creatinine between 1.3 and 1.5   Unspecified hypothyroidism- TSH 88   HTN (hypertension)   GERD (gastroesophageal reflux disease)   Chronic cough   Post-nasal drip   Bradycardia   PLAN: Myoview, Dr Acie Fredrickson to see.   George Ransom  PA-C Beeper [phone] 03/30/2014, 10:54 AM  Attending Note:   The patient was seen and examined.  Agree with assessment and plan as noted above.  Changes made to the above note as needed.  Pt's symptoms are worrisome.  He has exertional chest pain most but not all of the time that he exerts himself.      Will get a myoview study.   He needs to have his thyroid replaced.   George Robbins., MD, [facility] 03/30/2014, 11:56 AM

## 2014-03-30 NOTE — Progress Notes (Signed)
Transferred to 2W02 per wheelchair.  Report given to [place], Therapist, sports.  Patient is on NPO for stress test.

## 2014-03-30 NOTE — Progress Notes (Signed)
Radiology called positive stress test results, radiology tech stated that she was going to page Dr. Sherral Hammers and make him aware of the results Rickard Rhymes, RN

## 2014-03-30 NOTE — Progress Notes (Signed)
UR Completed.  George Robbins George Robbins [place] [phone] 03/30/2014  

## 2014-03-30 NOTE — Progress Notes (Signed)
Lexiscan myovue completed.  Tarri Fuller, PAC 1:51 PM

## 2014-03-30 NOTE — Progress Notes (Signed)
Pharmacy Consult - Heparin   PM Heparin level therapeutic No bleeding noted  Plan: 1) Continue heparin at current rate 2) Follow in AM  Thank you. Anette Guarneri, PharmD [phone]

## 2014-03-31 ENCOUNTER — Encounter (HOSPITAL_COMMUNITY)
Admission: EM | Disposition: A | Discharge status: Home / Self Care | Payer: Medicare Other | Source: Home / Self Care | Attending: Internal Medicine

## 2014-03-31 DIAGNOSIS — E039 Hypothyroidism, unspecified: Secondary | ICD-10-CM | POA: Diagnosis not present

## 2014-03-31 DIAGNOSIS — R0609 Other forms of dyspnea: Secondary | ICD-10-CM

## 2014-03-31 DIAGNOSIS — I498 Other specified cardiac arrhythmias: Secondary | ICD-10-CM | POA: Diagnosis not present

## 2014-03-31 DIAGNOSIS — I200 Unstable angina: Secondary | ICD-10-CM | POA: Diagnosis present

## 2014-03-31 DIAGNOSIS — I2510 Atherosclerotic heart disease of native coronary artery without angina pectoris: Secondary | ICD-10-CM

## 2014-03-31 DIAGNOSIS — R079 Chest pain, unspecified: Secondary | ICD-10-CM | POA: Diagnosis not present

## 2014-03-31 DIAGNOSIS — I10 Essential (primary) hypertension: Secondary | ICD-10-CM | POA: Diagnosis not present

## 2014-03-31 DIAGNOSIS — N182 Chronic kidney disease, stage 2 (mild): Secondary | ICD-10-CM | POA: Diagnosis not present

## 2014-03-31 DIAGNOSIS — R0989 Other specified symptoms and signs involving the circulatory and respiratory systems: Secondary | ICD-10-CM

## 2014-03-31 HISTORY — PX: PERCUTANEOUS CORONARY STENT INTERVENTION (PCI-S): SHX5485

## 2014-03-31 HISTORY — PX: LEFT HEART CATHETERIZATION WITH CORONARY ANGIOGRAM: SHX5451

## 2014-03-31 LAB — CBC
HCT: 34.8 % — ABNORMAL LOW (ref 39.0–52.0)
Hemoglobin: 11.7 g/dL — ABNORMAL LOW (ref 13.0–17.0)
MCH: 27.9 pg (ref 26.0–34.0)
MCHC: 33.6 g/dL (ref 30.0–36.0)
MCV: 83.1 fL (ref 78.0–100.0)
Platelets: 159 10*3/uL (ref 150–400)
RBC: 4.19 MIL/uL — ABNORMAL LOW (ref 4.22–5.81)
RDW: 16.8 % — ABNORMAL HIGH (ref 11.5–15.5)
WBC: 10.2 10*3/uL (ref 4.0–10.5)

## 2014-03-31 LAB — HEPARIN LEVEL (UNFRACTIONATED): Heparin Unfractionated: 0.74 [IU]/mL — ABNORMAL HIGH (ref 0.30–0.70)

## 2014-03-31 LAB — T4, FREE: FREE T4: 0.79 ng/dL — AB (ref 0.80–1.80)

## 2014-03-31 LAB — POCT ACTIVATED CLOTTING TIME: Activated Clotting Time: 764 seconds

## 2014-03-31 LAB — T3, FREE: T3 FREE: 1.3 pg/mL — AB (ref 2.3–4.2)

## 2014-03-31 LAB — PROTIME-INR
INR: 1.05 (ref 0.00–1.49)
Prothrombin Time: 13.5 seconds (ref 11.6–15.2)

## 2014-03-31 SURGERY — LEFT HEART CATHETERIZATION WITH CORONARY ANGIOGRAM
Anesthesia: LOCAL

## 2014-03-31 MED ORDER — SODIUM CHLORIDE 0.9 % IV SOLN: 250.0000 mL | INTRAVENOUS | Status: DC | PRN

## 2014-03-31 MED ORDER — ATORVASTATIN CALCIUM 80 MG PO TABS: 80.0000 mg | ORAL_TABLET | Freq: Every day | ORAL | Status: DC

## 2014-03-31 MED ORDER — SODIUM CHLORIDE 0.9 % IV SOLN: 0.2500 mg/kg/h | INTRAVENOUS | Status: AC

## 2014-03-31 MED ORDER — HYDROCORTISONE NA SUCCINATE PF 100 MG IJ SOLR
100.0000 mg | Freq: Once | INTRAMUSCULAR | Status: AC
  Administered 2014-03-31: 100 mg via INTRAVENOUS
  Filled 2014-03-31: qty 2

## 2014-03-31 MED ORDER — SODIUM CHLORIDE 0.9 % IJ SOLN: 3.0000 mL | INTRAMUSCULAR | Status: DC | PRN

## 2014-03-31 MED ORDER — MIDAZOLAM HCL 2 MG/2ML IJ SOLN
INTRAMUSCULAR | Status: AC
  Filled 2014-03-31: qty 2

## 2014-03-31 MED ORDER — FAMOTIDINE IN NACL 20-0.9 MG/50ML-% IV SOLN
20.0000 mg | Freq: Once | INTRAVENOUS | Status: AC
  Administered 2014-03-31: 20 mg via INTRAVENOUS
  Filled 2014-03-31: qty 50

## 2014-03-31 MED ORDER — CLOPIDOGREL BISULFATE 300 MG PO TABS
ORAL_TABLET | ORAL | Status: AC
  Filled 2014-03-31: qty 2

## 2014-03-31 MED ORDER — SODIUM CHLORIDE 0.9 % IV SOLN: INTRAVENOUS | Status: AC

## 2014-03-31 MED ORDER — ASPIRIN 81 MG PO CHEW
CHEWABLE_TABLET | ORAL | Status: AC
  Filled 2014-03-31: qty 1

## 2014-03-31 MED ORDER — RANITIDINE HCL 50 MG/2ML IJ SOLN: 50.0000 mg | Freq: Once | INTRAVENOUS | Status: DC

## 2014-03-31 MED ORDER — DIPHENHYDRAMINE HCL 50 MG/ML IJ SOLN
25.0000 mg | Freq: Once | INTRAMUSCULAR | Status: AC
  Administered 2014-03-31: 25 mg via INTRAVENOUS
  Filled 2014-03-31: qty 1

## 2014-03-31 MED ORDER — NITROGLYCERIN 0.2 MG/ML ON CALL CATH LAB
INTRAVENOUS | Status: AC
  Filled 2014-03-31: qty 1

## 2014-03-31 MED ORDER — CLOPIDOGREL BISULFATE 75 MG PO TABS
75.0000 mg | ORAL_TABLET | Freq: Every day | ORAL | Status: DC
Start: 2014-04-01 — End: 2014-04-01
  Administered 2014-04-01: 75 mg via ORAL
  Filled 2014-03-31: qty 1

## 2014-03-31 MED ORDER — FENTANYL CITRATE 0.05 MG/ML IJ SOLN
INTRAMUSCULAR | Status: AC
Start: 2014-03-31 — End: 2014-03-31
  Filled 2014-03-31: qty 2

## 2014-03-31 MED ORDER — SODIUM CHLORIDE 0.9 % IJ SOLN
3.0000 mL | Freq: Two times a day (BID) | INTRAMUSCULAR | Status: DC
Start: 2014-03-31 — End: 2014-03-31
  Administered 2014-03-31: 3 mL via INTRAVENOUS

## 2014-03-31 MED ORDER — LIDOCAINE HCL (PF) 1 % IJ SOLN
INTRAMUSCULAR | Status: AC
  Filled 2014-03-31: qty 30

## 2014-03-31 MED ORDER — BIVALIRUDIN 250 MG IV SOLR
INTRAVENOUS | Status: AC
  Filled 2014-03-31: qty 250

## 2014-03-31 MED ORDER — CARVEDILOL 6.25 MG PO TABS
6.2500 mg | ORAL_TABLET | Freq: Two times a day (BID) | ORAL | Status: DC
  Administered 2014-04-01: 08:00:00 6.25 mg via ORAL
  Filled 2014-03-31 (×2): qty 1
  Filled 2014-03-31: qty 2
  Filled 2014-03-31: qty 1

## 2014-03-31 MED ORDER — VERAPAMIL HCL 2.5 MG/ML IV SOLN
INTRAVENOUS | Status: AC
  Filled 2014-03-31: qty 2

## 2014-03-31 MED ORDER — ASPIRIN EC 325 MG PO TBEC: 325.0000 mg | DELAYED_RELEASE_TABLET | Freq: Every day | ORAL | Status: DC

## 2014-03-31 MED ORDER — HEPARIN (PORCINE) IN NACL 2-0.9 UNIT/ML-% IJ SOLN
INTRAMUSCULAR | Status: AC
  Filled 2014-03-31: qty 1500

## 2014-03-31 MED ORDER — SODIUM CHLORIDE 0.9 % IV SOLN
INTRAVENOUS | Status: DC
  Administered 2014-03-31: 12:00:00 via INTRAVENOUS

## 2014-03-31 MED ORDER — ATORVASTATIN CALCIUM 80 MG PO TABS
80.0000 mg | ORAL_TABLET | Freq: Every day | ORAL | Status: DC
Start: 2014-03-31 — End: 2014-04-01
  Administered 2014-03-31: 22:00:00 80 mg via ORAL
  Filled 2014-03-31 (×3): qty 1

## 2014-03-31 MED ORDER — ASPIRIN 81 MG PO CHEW
81.0000 mg | CHEWABLE_TABLET | ORAL | Status: AC
  Administered 2014-03-31: 81 mg via ORAL
  Filled 2014-03-31: qty 1

## 2014-03-31 NOTE — Progress Notes (Addendum)
    Subjective:   63 year old gentleman with a history hypertension, chronic kidney disease, hypothyroidism was admitted with chest pain. myoview showed a mid anterior defect.   Also has significant hypothyroidism.   No chest pain at rest.  Objective:  Vital Signs in the last 24 hours: Temp:  [97.8 F (36.6 C)-98.1 F (36.7 C)] 97.8 F (36.6 C) (05/06 0437) Pulse Rate:  [55-111] 62 (05/06 0437) Resp:  [14-18] 18 (05/06 0437) BP: (112-135)/(66-86) 112/66 mmHg (05/06 0437) SpO2:  [92 %-99 %] 99 % (05/06 0858)  Intake/Output from previous day:  Intake/Output Summary (Last 24 hours) at 03/31/14 1023 Last data filed at 03/31/14 0700  Gross per 24 hour  Intake    165 ml  Output    600 ml  Net   -435 ml    Physical Exam: General appearance: alert, cooperative and no distress Neck: no carotid bruit and no JVD Lungs: clear to auscultation bilaterally Heart: regular rate and rhythm   Rate: 52  Rhythm: normal sinus rhythm and sinus bradycardia  Lab Results:  Recent Labs  03/30/14 0356 03/31/14 0330  WBC 8.1 10.2  HGB 11.2* 11.7*  PLT 159 159    Recent Labs  03/29/14 1900 03/30/14 0356  NA 140 141  K 4.2 4.1  CL 101 103  CO2 27 25  GLUCOSE 94 93  BUN 14 12  CREATININE 1.45* 1.26    Recent Labs  03/30/14 0956 03/30/14 1726  TROPONINI <0.30 <0.30   No results found for this basename: INR,  in the last 72 hours  Imaging: Imaging results have been reviewed  Cardiac Studies:  Assessment/Plan:  63 yr old male with hx of HTN , CKD Hypothyroidism admitted with chest pain. Troponin negative x 3. His TSH came back high -88. With CRI and negative Troponin will proceed with Myoview as opposed to cath.       1.  Chest pain with moderate risk of acute coronary syndrome: He presents with chest pain. His Myoview study reveals a small anterior defect. We'll schedule him for cardiac catheterization today.  Orders written.    2.   CKD stage 3 with baseline  creatinine between 1.3 and 1.5  3.   Unspecified hypothyroidism- TSH 88   4.  HTN (hypertension)   GERD (gastroesophageal reflux disease)   Chronic cough   Post-nasal drip   Bradycardia     [name], [name]., MD, [facility] 03/31/2014, 10:28 AM Office - [phone] Pager [phone]

## 2014-03-31 NOTE — Progress Notes (Signed)
ANTICOAGULATION CONSULT NOTE - Follow Up Consult  Pharmacy Consult for bivalirudin Indication: s/p cath  No Known Allergies  Patient Measurements: Height: 5\' 4"  (162.6 cm) Weight: 164 lb 0.4 oz (74.4 kg) IBW/kg (Calculated) : 59.2 Heparin Dosing Weight:   Vital Signs: Pulse Rate: 77 (05/06 1446)  Labs:  Recent Labs  03/29/14 1900  03/30/14 0356 03/30/14 0956 03/30/14 1726 03/30/14 2050 03/31/14 0330 03/31/14 1300  HGB 11.5*  --  11.2*  --   --   --  11.7*  --   HCT 34.5*  --  33.6*  --   --   --  34.8*  --   PLT 162  --  159  --   --   --  159  --   LABPROT  --   --   --   --   --   --   --  13.5  INR  --   --   --   --   --   --   --  1.05  HEPARINUNFRC  --   --  0.74*  --   --  0.67 0.74*  --   CREATININE 1.45*  --  1.26  --   --   --   --   --   TROPONINI  --   < > <0.30 <0.30 <0.30  --   --   --   < > = values in this interval not displayed.  Estimated Creatinine Clearance: 56.9 ml/min (by C-G formula based on Cr of 1.26).   Medications:  Scheduled:  . aspirin EC  325 mg Oral Daily  . aspirin EC  81 mg Oral Daily  . [START ON 04/01/2014] atorvastatin  80 mg Oral q1800  . [START ON 04/01/2014] carvedilol  6.25 mg Oral BID WC  . [START ON 04/01/2014] clopidogrel  75 mg Oral Q breakfast  . docusate sodium  100 mg Oral BID  . fluticasone  2 spray Each Nare Daily  . fluticasone  1 puff Inhalation BID  . levothyroxine  100 mcg Oral QAC breakfast  . pantoprazole  40 mg Oral Daily  . sodium chloride  3 mL Intravenous Q12H   Infusions:  . sodium chloride 100 mL/hr at 03/31/14 1644    Assessment: 63 yo male s/p cath will be continued on bivalirudin x 4 hours.  Heparin is discontinued. Goal of Therapy:   Monitor platelets by anticoagulation protocol: Yes   Plan:  1) Continue bivalirudin at 0.25 mg/kg/hr x 4 hours then off. Pharmacy will sign off  Tsz-Yin Jodie Cavey 03/31/2014,6:46 PM
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
## 2014-03-31 NOTE — Progress Notes (Signed)
UR completed. Patient changed to inpatient- requiring IV heparin- Cardiac Cath

## 2014-03-31 NOTE — Progress Notes (Signed)
ANTICOAGULATION CONSULT NOTE - Follow-up Consult  Pharmacy Consult for Heparin Indication: chest pain/ACS  No Known Allergies  Patient Measurements: Height: 5\' 4"  (162.6 cm) Weight: 164 lb 0.4 oz (74.4 kg) IBW/kg (Calculated) : 59.2   Vital Signs: Temp: 97.8 F (36.6 C) (05/06 0437) Temp src: Oral (05/06 0437) BP: 112/66 mmHg (05/06 0437) Pulse Rate: 62 (05/06 0437)  Labs:  Recent Labs  03/29/14 1900  03/30/14 0356 03/30/14 0956 03/30/14 1726 03/30/14 2050 03/31/14 0330  HGB 11.5*  --  11.2*  --   --   --  11.7*  HCT 34.5*  --  33.6*  --   --   --  34.8*  PLT 162  --  159  --   --   --  159  HEPARINUNFRC  --   --  0.74*  --   --  0.67 0.74*  CREATININE 1.45*  --  1.26  --   --   --   --   TROPONINI  --   < > <0.30 <0.30 <0.30  --   --   < > = values in this interval not displayed.  Estimated Creatinine Clearance: 56.9 ml/min (by C-G formula based on Cr of 1.26).  Assessment: 63 y.o. male on heparin for r/o ACS. S/p myoview yesterday which showed EF 54% with normal cardiac wall motion and small focus of inducible ischemia in mid ventricular anterior wall. Pt continues on heparin gtt. Heparin level 0.74 (slightly supratherapeutic) on 900 units this a.m. No bleeding noted. H/H remains stable.  Goal of Therapy:  Heparin level 0.3-0.7 units/ml Monitor platelets by anticoagulation protocol: Yes   Plan:  1) Decrease heparin to 800 units/hr 2) Daily heparin level and CBC 3) Will f/u cardiology plans  Sherlon Handing, PharmD, BCPS Clinical pharmacist, pager [phone] 03/31/2014,9:07 AM

## 2014-03-31 NOTE — H&P (View-Only) (Signed)
    Subjective:   63-year-old gentleman with a history hypertension, chronic kidney disease, hypothyroidism was admitted with chest pain. myoview showed a mid anterior defect.   Also has significant hypothyroidism.   No chest pain at rest.  Objective:  Vital Signs in the last 24 hours: Temp:  [97.8 F (36.6 C)-98.1 F (36.7 C)] 97.8 F (36.6 C) (05/06 0437) Pulse Rate:  [55-111] 62 (05/06 0437) Resp:  [14-18] 18 (05/06 0437) BP: (112-135)/(66-86) 112/66 mmHg (05/06 0437) SpO2:  [92 %-99 %] 99 % (05/06 0858)  Intake/Output from previous day:  Intake/Output Summary (Last 24 hours) at 03/31/14 1023 Last data filed at 03/31/14 0700  Gross per 24 hour  Intake    165 ml  Output    600 ml  Net   -435 ml    Physical Exam: General appearance: alert, cooperative and no distress Neck: no carotid bruit and no JVD Lungs: clear to auscultation bilaterally Heart: regular rate and rhythm   Rate: 52  Rhythm: normal sinus rhythm and sinus bradycardia  Lab Results:  Recent Labs  03/30/14 0356 03/31/14 0330  WBC 8.1 10.2  HGB 11.2* 11.7*  PLT 159 159    Recent Labs  03/29/14 1900 03/30/14 0356  NA 140 141  K 4.2 4.1  CL 101 103  CO2 27 25  GLUCOSE 94 93  BUN 14 12  CREATININE 1.45* 1.26    Recent Labs  03/30/14 0956 03/30/14 1726  TROPONINI <0.30 <0.30   No results found for this basename: INR,  in the last 72 hours  Imaging: Imaging results have been reviewed  Cardiac Studies:  Assessment/Plan:  63 yr old male with hx of HTN , CKD Hypothyroidism admitted with chest pain. Troponin negative x 3. His TSH came back high -88. With CRI and negative Troponin will proceed with Myoview as opposed to cath.       1.  Chest pain with moderate risk of acute coronary syndrome: He presents with chest pain. His Myoview study reveals a small anterior defect. We'll schedule him for cardiac catheterization today.  Orders written.    2.   CKD stage 3 with baseline  creatinine between 1.3 and 1.5  3.   Unspecified hypothyroidism- TSH 88   4.  HTN (hypertension)   GERD (gastroesophageal reflux disease)   Chronic cough   Post-nasal drip   Bradycardia     Quenton Recendez J. Kimori Tartaglia, Jr., MD, FACC 03/31/2014, 10:28 AM Office - [phone] Pager [phone]     

## 2014-03-31 NOTE — CV Procedure (Signed)
Cardiac Catheterization Procedure Note  Name: George Robbins MRN: [id number] DOB: 1951-04-17  Procedure: Left Heart Cath, Selective Coronary Angiography,  PTCA and stenting of the ostial LAD and OM 2.  Indication: Unstable angina with abnormal stress test.  Medications:  Sedation:  3 mg IV Versed, 75 mcg IV Fentanyl  Contrast:  170 mL Omnipaque  Procedural Details: The right wrist was prepped, draped, and anesthetized with 1% lidocaine. Using the modified Seldinger technique, a 5 French Slender sheath was introduced into the right radial artery. 3 mg of verapamil was administered through the sheath, weight-based unfractionated heparin was administered intravenously. A Jackie catheter was used for selective coronary angiography and to record left ventricular pressure. Catheter exchanges were performed over an exchange length guidewire. There were no immediate procedural complications.  Procedural Findings:  Hemodynamics: AO:  13 8/73   mmHg LV:  13 8/5    mmHg LVEDP: 11  mmHg  Coronary angiography: Coronary dominance: Codominant   Left Main:  Short and normal.  Left Anterior Descending (LAD):  Normal in size with 99% proximal stenosis a few millimeters from the ostium. The midsegment has diffuse 10% disease.  1st diagonal (D1):  Normal in size with minor irregularities.  2nd diagonal (D2):  Normal in size with minor irregularities.  3rd diagonal (D3):  Small in size with no significant disease.  Circumflex (LCx):  Normal in size and codominant. The vessel has minor irregularities.  1st obtuse marginal:  Very small in size.  2nd obtuse marginal:  Large in size with hazy 80% stenosis proximally.  3rd obtuse marginal:  Small in size with minor irregularities.   AV groove continuation segment: Normal in size with no significant disease.   Right Coronary Artery: Medium in size and codominant. The vessel has no significant disease.  Posterior descending artery:  Normal in size with no significant disease.   Left ventriculography: Was not performed due to chronic kidney disease. EF is normal by noninvasive testing.  PCI Note:  Following the diagnostic procedure, the decision was made to proceed with PCI.  Weight-based bivalirudin was given for anticoagulation. Once a therapeutic ACT was achieved, a 6 [place] JL 3.5 guide catheter was inserted.  A run through coronary guidewire was used to cross the lesion.  The lesion was predilated with a 2.5 x 12 balloon.  The lesion was then stented with a 3.0 x 15 mm Xience drug-eluting stent.  The stent was postdilated with a 3.25 and then 3.5 noncompliant balloon.  Following PCI, there was 0% residual stenosis and TIMI-3 flow. Final angiography confirmed an excellent result. The wire was removed and instructed into the OM 2. The lesion and proximal OM 2 was direct stented with a 3.0 x 15 mm  Xience drug-eluting stent. This was post dilated with a 3.25 x 12 noncompliant balloon. Final angiography showed excellent results with no residual stenosis. The patient tolerated the procedure well. There were no immediate procedural complications. A TR band was used for radial hemostasis. The patient was transferred to the post catheterization recovery area for further monitoring.  PCI Data: Vessel - LAD/Segment - proximal/ostial Percent Stenosis (pre)  99% TIMI-flow 2 Stent 3.0 x 15 mm Xience drug-eluting stent postdilated with a 3.5 noncompliant balloon Percent Stenosis (post)  0% TIMI-flow (post) 3  Vessel - OM 2/Segment - proximal Percent Stenosis (pre)  80% TIMI-flow 3 Stent 3.0 x 15 mm Xience  drug-eluting stent  Percent Stenosis (post)  0%  TIMI-flow (post)  3  Final Conclusions:   1. Significant 2 vessel coronary artery disease.   2. Normal LV systolic function by noninvasive testing. Normal left ventricular end-diastolic pressure 3. Successful angioplasty and drug-eluting stent placement to the proximal LAD and  proximal OM 2  Recommendations:   recommend dual antiplatelet therapy for at least 12 months and ideally longer given the location of the LAD stent. Aggressive treatment of risk factors is recommended.   Wellington Hampshire MD, [facility] 03/31/2014, 4:30 PM

## 2014-03-31 NOTE — Interval H&P Note (Signed)
Cath Lab Visit (complete for each Cath Lab visit)  Clinical Evaluation Leading to the Procedure:   ACS: no  Non-ACS:    Anginal Classification: CCS III  Anti-ischemic medical therapy: Maximal Therapy (2 or more classes of medications)  Non-Invasive Test Results: Intermediate-risk stress test findings: cardiac mortality 1-3%/year  Prior CABG: No previous CABG      History and Physical Interval Note:  03/31/2014 2:53 PM  George Robbins  has presented today for surgery, with the diagnosis of cp  The various methods of treatment have been discussed with the patient and family. After consideration of risks, benefits and other options for treatment, the patient has consented to  Procedure(s): LEFT HEART CATHETERIZATION WITH CORONARY ANGIOGRAM (N/A) as a surgical intervention .  The patient's history has been reviewed, patient examined, no change in status, stable for surgery.  I have reviewed the patient's chart and labs.  Questions were answered to the patient's satisfaction.     Wellington Hampshire

## 2014-03-31 NOTE — Care Management Note (Unsigned)
    Page 1 of 1   03/31/2014     2:25:27 PM CARE MANAGEMENT NOTE 03/31/2014  Patient:  George Robbins, George Robbins   Account Number:  [id number]  Date Initiated:  03/31/2014  Documentation initiated by:  Advith Martine  Subjective/Objective Assessment:   Pt adm on 03/29/14 with chest pain, pos stress test.  PTA, pt resides at home with family.     Action/Plan:   Will follow for dc needs as pt progresses.   Anticipated DC Date:  04/02/2014   Anticipated DC Plan:  [place]  CM consult      Choice offered to / List presented to:             Status of service:  In process, will continue to follow Medicare Important Message given?  YES (If response is "NO", the following Medicare IM given date fields will be blank) Date Medicare IM given:  03/30/2014 Date Additional Medicare IM given:    Discharge Disposition:    Per UR Regulation:    If discussed at Long Length of Stay Meetings, dates discussed:    Comments:

## 2014-04-01 DIAGNOSIS — Z9861 Coronary angioplasty status: Secondary | ICD-10-CM | POA: Diagnosis not present

## 2014-04-01 DIAGNOSIS — E039 Hypothyroidism, unspecified: Secondary | ICD-10-CM | POA: Diagnosis not present

## 2014-04-01 DIAGNOSIS — R079 Chest pain, unspecified: Secondary | ICD-10-CM | POA: Diagnosis not present

## 2014-04-01 DIAGNOSIS — E785 Hyperlipidemia, unspecified: Secondary | ICD-10-CM

## 2014-04-01 DIAGNOSIS — R0609 Other forms of dyspnea: Secondary | ICD-10-CM | POA: Diagnosis not present

## 2014-04-01 DIAGNOSIS — I2510 Atherosclerotic heart disease of native coronary artery without angina pectoris: Secondary | ICD-10-CM

## 2014-04-01 DIAGNOSIS — I200 Unstable angina: Secondary | ICD-10-CM | POA: Diagnosis not present

## 2014-04-01 DIAGNOSIS — N182 Chronic kidney disease, stage 2 (mild): Secondary | ICD-10-CM | POA: Diagnosis not present

## 2014-04-01 LAB — BASIC METABOLIC PANEL
BUN: 13 mg/dL (ref 6–23)
CHLORIDE: 104 meq/L (ref 96–112)
CO2: 23 meq/L (ref 19–32)
Calcium: 9.4 mg/dL (ref 8.4–10.5)
Creatinine, Ser: 1.23 mg/dL (ref 0.50–1.35)
GFR calc Af Amer: 72 mL/min — ABNORMAL LOW (ref >90)
GFR calc non Af Amer: 62 mL/min — ABNORMAL LOW (ref >90)
GLUCOSE: 105 mg/dL — AB (ref 70–99)
POTASSIUM: 3.9 meq/L (ref 3.7–5.3)
SODIUM: 140 meq/L (ref 137–147)

## 2014-04-01 LAB — CBC
HEMATOCRIT: 35.5 % — AB (ref 39.0–52.0)
HEMOGLOBIN: 11.9 g/dL — AB (ref 13.0–17.0)
MCH: 27.7 pg (ref 26.0–34.0)
MCHC: 33.5 g/dL (ref 30.0–36.0)
MCV: 82.8 fL (ref 78.0–100.0)
Platelets: 166 10*3/uL (ref 150–400)
RBC: 4.29 MIL/uL (ref 4.22–5.81)
RDW: 16.8 % — ABNORMAL HIGH (ref 11.5–15.5)
WBC: 14.3 10*3/uL — AB (ref 4.0–10.5)

## 2014-04-01 MED ORDER — SIMVASTATIN 40 MG PO TABS: 40.0000 mg | ORAL_TABLET | Freq: Every day | ORAL | Status: DC

## 2014-04-01 MED ORDER — CARVEDILOL 6.25 MG PO TABS: 6.2500 mg | ORAL_TABLET | Freq: Two times a day (BID) | ORAL | Status: DC

## 2014-04-01 MED ORDER — PANTOPRAZOLE SODIUM 40 MG PO TBEC: 40.0000 mg | DELAYED_RELEASE_TABLET | Freq: Every day | ORAL | Status: DC

## 2014-04-01 MED ORDER — LEVOTHYROXINE SODIUM 100 MCG PO TABS: 100.0000 ug | ORAL_TABLET | Freq: Every day | ORAL | Status: DC

## 2014-04-01 MED ORDER — CLOPIDOGREL BISULFATE 75 MG PO TABS: 75.0000 mg | ORAL_TABLET | Freq: Every day | ORAL | Status: DC

## 2014-04-01 MED ORDER — SIMVASTATIN 40 MG PO TABS
40.0000 mg | ORAL_TABLET | Freq: Every day | ORAL | Status: DC
  Filled 2014-04-01: qty 1

## 2014-04-01 MED FILL — Sodium Chloride IV Soln 0.9%: INTRAVENOUS | Qty: 50 | Status: AC

## 2014-04-01 NOTE — Progress Notes (Signed)
Patient Name: George Robbins Date of Encounter: 04/01/2014     Principal Problem:   Unstable angina Active Problems:   CKD stage 3 with baseline creatinine between 1.3 and 1.5   HTN (hypertension)   Hyperlipidemia   Unspecified hypothyroidism- TSH 88   GERD (gastroesophageal reflux disease)   Chronic cough   Post-nasal drip   Bradycardia    SUBJECTIVE  S/P DES to the LAD and OM2 yesterday.  No chest pain or sob overnight.  Apparently had an allergic rxn to something last night and developed a rash requiring benadryl IV.  No recurrence.    CURRENT MEDS . aspirin EC  81 mg Oral Daily  . atorvastatin  80 mg Oral q1800  . carvedilol  6.25 mg Oral BID WC  . clopidogrel  75 mg Oral Q breakfast  . docusate sodium  100 mg Oral BID  . fluticasone  2 spray Each Nare Daily  . fluticasone  1 puff Inhalation BID  . levothyroxine  100 mcg Oral QAC breakfast  . pantoprazole  40 mg Oral Daily  . sodium chloride  3 mL Intravenous Q12H    OBJECTIVE  Filed Vitals:   03/31/14 2100 04/01/14 0006 04/01/14 0100 04/01/14 0557  BP: 110/75 123/84  116/77  Pulse: 86 106  89  Temp: 97.7 F (36.5 C) 97.3 F (36.3 C)  98.1 F (36.7 C)  TempSrc: Oral Oral  Oral  Resp: 16 18  20   Height:      Weight:   160 lb 4.4 oz (72.7 kg)   SpO2: 94% 93%  93%    Intake/Output Summary (Last 24 hours) at 04/01/14 0726 Last data filed at 04/01/14 0300  Gross per 24 hour  Intake 1146.67 ml  Output      0 ml  Net 1146.67 ml   Filed Weights   03/30/14 0340 03/31/14 1154 04/01/14 0100  Weight: 164 lb 0.4 oz (74.4 kg) 164 lb 0.4 oz (74.4 kg) 160 lb 4.4 oz (72.7 kg)    PHYSICAL EXAM  General: Pleasant, NAD. Neuro: Alert and oriented X 3. Moves all extremities spontaneously. Psych: Normal affect. HEENT:  Normal  Neck: Supple without bruits or JVD. Lungs:  Resp regular and unlabored, CTA. Heart: RRR no s3, s4, or murmurs. Abdomen: Soft, non-tender, non-distended, BS + x 4.  Extremities: No  clubbing, cyanosis or edema. DP/PT/Radials 2+ and equal bilaterally.  R wrist w/o bleeding/bruit/hematoma.  Accessory Clinical Findings  CBC  Recent Labs  03/31/14 0330 04/01/14 0408  WBC 10.2 14.3*  HGB 11.7* 11.9*  HCT 34.8* 35.5*  MCV 83.1 82.8  PLT 159 235   Basic Metabolic Panel  Recent Labs  03/29/14 1900 03/30/14 0356 04/01/14 0408  NA 140 141 140  K 4.2 4.1 3.9  CL 101 103 104  CO2 27 25 23   GLUCOSE 94 93 105*  BUN 14 12 13   CREATININE 1.45* 1.26 1.23  CALCIUM 9.6 9.4 9.4  MG  --  2.0  --   PHOS  --  3.4  --    Liver Function Tests  Recent Labs  03/30/14 0356  AST 18  ALT 11  ALKPHOS 63  BILITOT 0.3  PROT 6.9  ALBUMIN 3.7   Cardiac Enzymes  Recent Labs  03/30/14 0356 03/30/14 0956 03/30/14 1726  TROPONINI <0.30 <0.30 <0.30   Thyroid Function Tests  Recent Labs  03/30/14 0356 03/30/14 1726  TSH 88.460*  --   T3FREE  --  1.3*   TELE  sb->rsr->sinus tach this morning.  ECG  Rsr, 83, inflat twi - slightly more pronounced today.  Radiology/Studies  Nm Myocar Multi W/spect W/wall Motion / Ef  03/30/2014   CLINICAL DATA:  63 year old male with chest pain  EXAM: MYOCARDIAL IMAGING WITH SPECT (REST AND PHARMACOLOGIC-STRESS)  GATED LEFT VENTRICULAR WALL MOTION STUDY  LEFT VENTRICULAR EJECTION FRACTION IMPRESSION: 1. Positive for small focus of inducible ischemia in the mid ventricular anterior wall. 2. Normal cardiac wall motion. 3. Calculated ejection fraction 54%. These results will be called to the ordering clinician or representative by the Radiologist Assistant, and communication documented in the PACS Dashboard.   Electronically Signed   By: George Robbins M.D.   On: 03/30/2014 17:20   Dg Chest Port 1 View  03/29/2014   CLINICAL DATA:  Chest pain  EXAM: PORTABLE CHEST - 1 VIEWIMPRESSION: Stable elevation of the left hemidiaphragm with left basilar atelectasis. No acute cardiopulmonary abnormality.   Electronically Signed   By:  George Robbins M.D.   On: 03/29/2014 19:34   ASSESSMENT AND PLAN  1.  USA/CAD:  S/p cath yesterday revealing severe LAD and OM2 dzs->s/p PCI/DES placement in both areas.  No chest pain overnight.  He did have a post-cath allergic rxn to something last night with rash and req IV benadryl, solumedrol, and pepcid.  This occurred almost immediately after receiving lipitor last night - it was the first dose he's received this admission.  ? Rxn to lipitor vs delayed rxn to plavix or contrast.  Watch after plavix dose this AM to see if he has recurrence.  If so, we will need to switch him to an alternate p2y12 inhibitor.  OTW, would switch to alternate statin.  2.  Allergic Rxn:  See #1.  3.  CKD III:  Stable.  4.  HTN:  Stable.  5.  HL: LDL 196.  As above - allergic rxn after receiving lipitor last night.  Will switch to alternate statin in case lipitor is the culprit.  6.  Hypothyroidism:  Per IM. Signed, George Mire NP  I have personally seen and examined this patient with George Bayley, NP. I agree with the assessment and plan as outlined above. Test dose Plavix this am. If no reaction, change statin. OK to d/c home today. Follow up with George Robbins in 2-3 weeks.   George Robbins 04/01/2014 11:08 AM

## 2014-04-01 NOTE — Progress Notes (Signed)
CARDIAC REHAB PHASE I   PRE:  Rate/Rhythm: 93 SR  BP:  Supine:   Sitting: 122/75  Standing:    SaO2:   MODE:  Ambulation: 500 ft   POST:  Rate/Rhythm: 118 ST  BP:  Supine:   Sitting: 132/82  Standing:    SaO2:  0755-0900 Pt tolerated ambulation well without c/o of cp or SOB. VS stable. Completed stent education with pt. He had difficulty with teach back. I reported to his nurse. Pt's sister is coming to pick him up. RN plans to go over information with her. Pt agrees to [facility]. CRP in [place], will send referral.  Rodney Langton RN 04/01/2014 9:06 AM
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
## 2014-04-01 NOTE — Progress Notes (Signed)
TR BAND REMOVAL  LOCATION:    right radial  DEFLATED PER PROTOCOL:    yes  TIME BAND OFF / DRESSING APPLIED:    23:15   SITE UPON ARRIVAL:    Level 0  SITE AFTER BAND REMOVAL:    Level 0  REVERSE ALLEN'S TEST:     positive  CIRCULATION SENSATION AND MOVEMENT:    Within Normal Limits   yes  COMMENTS:

## 2014-04-19 ENCOUNTER — Ambulatory Visit: Payer: No Typology Code available for payment source | Admitting: Internal Medicine

## 2014-04-21 ENCOUNTER — Ambulatory Visit: Payer: Medicare Other | Attending: Internal Medicine | Admitting: Internal Medicine

## 2014-04-21 ENCOUNTER — Encounter: Payer: Self-pay | Admitting: Internal Medicine

## 2014-04-21 VITALS — BP 126/69 | HR 87 | Temp 98.7°F | Resp 14 | Ht 64.0 in | Wt 160.0 lb

## 2014-04-21 DIAGNOSIS — E039 Hypothyroidism, unspecified: Secondary | ICD-10-CM | POA: Insufficient documentation

## 2014-04-21 DIAGNOSIS — Z79899 Other long term (current) drug therapy: Secondary | ICD-10-CM | POA: Diagnosis not present

## 2014-04-21 DIAGNOSIS — I10 Essential (primary) hypertension: Secondary | ICD-10-CM | POA: Diagnosis not present

## 2014-04-21 DIAGNOSIS — I200 Unstable angina: Secondary | ICD-10-CM | POA: Diagnosis not present

## 2014-04-21 DIAGNOSIS — E785 Hyperlipidemia, unspecified: Secondary | ICD-10-CM | POA: Insufficient documentation

## 2014-04-21 DIAGNOSIS — D649 Anemia, unspecified: Secondary | ICD-10-CM | POA: Insufficient documentation

## 2014-04-21 DIAGNOSIS — R059 Cough, unspecified: Secondary | ICD-10-CM | POA: Diagnosis present

## 2014-04-21 DIAGNOSIS — Z0000 Encounter for general adult medical examination without abnormal findings: Secondary | ICD-10-CM

## 2014-04-21 DIAGNOSIS — R05 Cough: Secondary | ICD-10-CM

## 2014-04-21 DIAGNOSIS — R053 Chronic cough: Secondary | ICD-10-CM

## 2014-04-21 DIAGNOSIS — Z7982 Long term (current) use of aspirin: Secondary | ICD-10-CM | POA: Diagnosis not present

## 2014-04-21 DIAGNOSIS — Z87891 Personal history of nicotine dependence: Secondary | ICD-10-CM | POA: Insufficient documentation

## 2014-04-21 DIAGNOSIS — Z7902 Long term (current) use of antithrombotics/antiplatelets: Secondary | ICD-10-CM | POA: Diagnosis not present

## 2014-04-21 DIAGNOSIS — K219 Gastro-esophageal reflux disease without esophagitis: Secondary | ICD-10-CM | POA: Diagnosis not present

## 2014-04-21 MED ORDER — GUAIFENESIN-CODEINE 100-10 MG/5ML PO SYRP: 5.0000 mL | ORAL_SOLUTION | Freq: Three times a day (TID) | ORAL | Status: DC | PRN

## 2014-04-21 NOTE — Patient Instructions (Signed)

## 2014-04-21 NOTE — Progress Notes (Signed)
Pt is here following up on his asthma. Pt reports that he is coughing frequently. Pt states that he is had an alergic reaction from the Simvastatin w/ a rash.
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
## 2014-04-28 ENCOUNTER — Ambulatory Visit (INDEPENDENT_AMBULATORY_CARE_PROVIDER_SITE_OTHER): Payer: Medicare Other | Admitting: Cardiovascular Disease

## 2014-04-28 ENCOUNTER — Encounter: Payer: Self-pay | Admitting: Cardiovascular Disease

## 2014-04-28 VITALS — BP 104/80 | HR 72 | Ht 64.0 in | Wt 153.0 lb

## 2014-04-28 DIAGNOSIS — I2510 Atherosclerotic heart disease of native coronary artery without angina pectoris: Secondary | ICD-10-CM

## 2014-04-28 DIAGNOSIS — I200 Unstable angina: Secondary | ICD-10-CM

## 2014-04-28 MED ORDER — NITROGLYCERIN 0.4 MG SL SUBL: 0.4000 mg | SUBLINGUAL_TABLET | SUBLINGUAL | Status: DC | PRN

## 2014-04-28 NOTE — Patient Instructions (Addendum)
Your physician has recommended you make the following change in your medication:  TAKE Nitroglycerin as needed for chest pain - place one pill under your tongue every 5 minutes up to a total of 3 pills   Your physician recommends that you schedule a follow-up appointment in: as needed with Dr. Acie Fredrickson. Follow-up with Dr. Verl Blalock at [facility]
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
## 2014-04-28 NOTE — Assessment & Plan Note (Signed)
The patient is doing well from a cardiac standpoint. He's not having any episodes of angina. We will have him return to see Dr. Lucillie Garfinkel who is his primary cardiologist. We will be happy to see him if needed.  Will send in script for SL NTG for PRN use.

## 2014-04-29 ENCOUNTER — Encounter (HOSPITAL_COMMUNITY)
Admission: RE | Admit: 2014-04-29 | Discharge: 2014-04-29 | Disposition: A | Discharge status: Home / Self Care | Payer: Medicare Other | Source: Ambulatory Visit | Attending: Cardiology | Admitting: Cardiology

## 2014-04-29 DIAGNOSIS — I2510 Atherosclerotic heart disease of native coronary artery without angina pectoris: Secondary | ICD-10-CM | POA: Insufficient documentation

## 2014-04-29 DIAGNOSIS — Z5189 Encounter for other specified aftercare: Secondary | ICD-10-CM | POA: Insufficient documentation

## 2014-04-29 DIAGNOSIS — R079 Chest pain, unspecified: Secondary | ICD-10-CM | POA: Insufficient documentation

## 2014-04-29 DIAGNOSIS — Z9861 Coronary angioplasty status: Secondary | ICD-10-CM | POA: Insufficient documentation

## 2014-04-29 DIAGNOSIS — I200 Unstable angina: Secondary | ICD-10-CM | POA: Insufficient documentation

## 2014-04-29 DIAGNOSIS — E785 Hyperlipidemia, unspecified: Secondary | ICD-10-CM | POA: Insufficient documentation

## 2014-04-29 DIAGNOSIS — I498 Other specified cardiac arrhythmias: Secondary | ICD-10-CM | POA: Insufficient documentation

## 2014-04-29 NOTE — Progress Notes (Signed)
Cardiac Rehab Medication Review by a Pharmacist  Does the patient  feel that his/her medications are working for him/her?  yes  Has the patient been experiencing any side effects to the medications prescribed?  no  Does the patient measure his/her own blood pressure or blood glucose at home?  yes   Does the patient have any problems obtaining medications due to transportation or finances?   no  Understanding of regimen: poor Understanding of indications: poor Potential of compliance: poor    Pharmacist comments: Patient states he also takes a little red pill (he is not sure what the medication is used for, possibly a replacement for his synthroid?).  He also states he should be taking aspirin, but has not been as he does not have any at home. I explained that he could purchase aspirin over the counter without a prescription.  He said he would go get some and start taking it.  Patient also states he takes a liquid for coughing/sleep occasionally that the doctor prescribed but he cannot remember the name.  George Robbins seems unsure exactly which medications he should be taking at what times.  I tried to clarify his inhalers, but without the actual inhaler present it was difficult getting him to understand.  He would benefit from a review of these with the medication present.   Thank you, Vivia Ewing, PharmD Clinical Pharmacist - Resident Pager: [phone] Pharmacy: [phone] 04/29/2014 8:55 AM

## 2014-05-03 ENCOUNTER — Encounter (HOSPITAL_COMMUNITY)
Admission: RE | Admit: 2014-05-03 | Discharge: 2014-05-03 | Disposition: A | Discharge status: Home / Self Care | Payer: Medicare Other | Source: Ambulatory Visit | Attending: Cardiology | Admitting: Cardiology

## 2014-05-03 DIAGNOSIS — I200 Unstable angina: Secondary | ICD-10-CM | POA: Diagnosis not present

## 2014-05-03 DIAGNOSIS — E785 Hyperlipidemia, unspecified: Secondary | ICD-10-CM | POA: Diagnosis not present

## 2014-05-03 DIAGNOSIS — I498 Other specified cardiac arrhythmias: Secondary | ICD-10-CM | POA: Diagnosis not present

## 2014-05-03 DIAGNOSIS — I2510 Atherosclerotic heart disease of native coronary artery without angina pectoris: Secondary | ICD-10-CM | POA: Diagnosis not present

## 2014-05-03 DIAGNOSIS — R079 Chest pain, unspecified: Secondary | ICD-10-CM | POA: Diagnosis not present

## 2014-05-03 DIAGNOSIS — Z9861 Coronary angioplasty status: Secondary | ICD-10-CM | POA: Diagnosis not present

## 2014-05-03 DIAGNOSIS — Z5189 Encounter for other specified aftercare: Secondary | ICD-10-CM | POA: Diagnosis not present

## 2014-05-03 NOTE — Progress Notes (Signed)
Pt started cardiac rehab today.  Pt tolerated light exercise without difficulty. Telemetry rhythm Sinus. Vital signs stable. Will continue to monitor the patient throughout  the program.  

## 2014-05-05 ENCOUNTER — Telehealth: Payer: Self-pay | Admitting: Internal Medicine

## 2014-05-05 ENCOUNTER — Encounter (HOSPITAL_COMMUNITY)
Admission: RE | Admit: 2014-05-05 | Discharge: 2014-05-05 | Disposition: A | Discharge status: Home / Self Care | Payer: Medicare Other | Source: Ambulatory Visit | Attending: Cardiology | Admitting: Cardiology

## 2014-05-05 DIAGNOSIS — I200 Unstable angina: Secondary | ICD-10-CM | POA: Diagnosis not present

## 2014-05-05 DIAGNOSIS — Z5189 Encounter for other specified aftercare: Secondary | ICD-10-CM | POA: Diagnosis not present

## 2014-05-05 DIAGNOSIS — I2510 Atherosclerotic heart disease of native coronary artery without angina pectoris: Secondary | ICD-10-CM | POA: Diagnosis not present

## 2014-05-05 DIAGNOSIS — I498 Other specified cardiac arrhythmias: Secondary | ICD-10-CM | POA: Diagnosis not present

## 2014-05-05 DIAGNOSIS — R079 Chest pain, unspecified: Secondary | ICD-10-CM | POA: Diagnosis not present

## 2014-05-05 DIAGNOSIS — Z9861 Coronary angioplasty status: Secondary | ICD-10-CM | POA: Diagnosis not present

## 2014-05-05 NOTE — Telephone Encounter (Signed)
Please refill his medications w/o refills

## 2014-05-05 NOTE — Telephone Encounter (Signed)
Pt has called in today to request all of his medications; pt stated that he was not updated on his refills on his last visit; please f/u with pt at your earliest convenience

## 2014-05-06 ENCOUNTER — Ambulatory Visit (INDEPENDENT_AMBULATORY_CARE_PROVIDER_SITE_OTHER): Payer: Medicare Other | Admitting: Emergency Medicine

## 2014-05-06 ENCOUNTER — Encounter: Payer: Self-pay | Admitting: Emergency Medicine

## 2014-05-06 ENCOUNTER — Telehealth: Payer: Self-pay | Admitting: Emergency Medicine

## 2014-05-06 VITALS — BP 130/86 | HR 67 | Ht 64.0 in | Wt 160.0 lb

## 2014-05-06 DIAGNOSIS — I200 Unstable angina: Secondary | ICD-10-CM

## 2014-05-06 DIAGNOSIS — R0982 Postnasal drip: Secondary | ICD-10-CM | POA: Diagnosis not present

## 2014-05-06 DIAGNOSIS — R053 Chronic cough: Secondary | ICD-10-CM

## 2014-05-06 DIAGNOSIS — R05 Cough: Secondary | ICD-10-CM | POA: Diagnosis not present

## 2014-05-06 DIAGNOSIS — J449 Chronic obstructive pulmonary disease, unspecified: Secondary | ICD-10-CM | POA: Insufficient documentation

## 2014-05-06 DIAGNOSIS — R059 Cough, unspecified: Secondary | ICD-10-CM

## 2014-05-06 MED ORDER — LORATADINE 10 MG PO TABS: 10.0000 mg | ORAL_TABLET | Freq: Every day | ORAL | Status: DC

## 2014-05-06 MED ORDER — PANTOPRAZOLE SODIUM 40 MG PO TBEC: 40.0000 mg | DELAYED_RELEASE_TABLET | Freq: Every day | ORAL | Status: DC

## 2014-05-06 MED ORDER — TIOTROPIUM BROMIDE MONOHYDRATE 18 MCG IN CAPS: 18.0000 ug | ORAL_CAPSULE | Freq: Every day | RESPIRATORY_TRACT | Status: DC

## 2014-05-06 NOTE — Assessment & Plan Note (Signed)
-   increase fluticasone and add loratadine

## 2014-05-06 NOTE — Assessment & Plan Note (Signed)
Severity unclear at this time. He needs full PFTs. Will change his Qvar to Spiriva for now, follow his progress

## 2014-05-06 NOTE — Assessment & Plan Note (Signed)
Suspect contributions of his postnasal drip which is only partially controlled on his fluticasone nasal spray, GERD, and his COPD.  - Will increase fluticasone nasal spray - start loratadine 10 mg daily - Temporarily increase Protonix to twice a day then go back to daily -
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
## 2014-05-06 NOTE — Telephone Encounter (Signed)
Called pt back in regards to prescriptions refill. Medication needed refilled per Pulmonologist Dr. Lamonte Sakai 05/06/14 Pt informed to call clinic if he needed additional medications.

## 2014-05-06 NOTE — Patient Instructions (Signed)
Please stop Qvar for now We will start Spiriva one inhalation daily until your next visit to see if this helps your breathing Start doing your fluticasone nasal spray, 2 sprays twice a day Temporarily increase your Protonix to 40 mg twice a day. Do this for one week and get her back to once a day.  We will perform full pulmonary function testing at your next office visit Follow with Dr Lamonte Sakai in one month with full PFTs

## 2014-05-07 ENCOUNTER — Other Ambulatory Visit: Payer: Self-pay

## 2014-05-07 ENCOUNTER — Telehealth: Payer: Self-pay | Admitting: Internal Medicine

## 2014-05-07 ENCOUNTER — Encounter (HOSPITAL_COMMUNITY)
Admission: RE | Admit: 2014-05-07 | Discharge: 2014-05-07 | Disposition: A | Discharge status: Home / Self Care | Payer: Medicare Other | Source: Ambulatory Visit | Attending: Cardiology | Admitting: Cardiology

## 2014-05-07 ENCOUNTER — Other Ambulatory Visit: Payer: Self-pay | Admitting: Internal Medicine

## 2014-05-07 DIAGNOSIS — Z9861 Coronary angioplasty status: Secondary | ICD-10-CM | POA: Diagnosis not present

## 2014-05-07 DIAGNOSIS — R079 Chest pain, unspecified: Secondary | ICD-10-CM | POA: Diagnosis not present

## 2014-05-07 DIAGNOSIS — I498 Other specified cardiac arrhythmias: Secondary | ICD-10-CM | POA: Diagnosis not present

## 2014-05-07 DIAGNOSIS — I2510 Atherosclerotic heart disease of native coronary artery without angina pectoris: Secondary | ICD-10-CM | POA: Diagnosis not present

## 2014-05-07 DIAGNOSIS — Z5189 Encounter for other specified aftercare: Secondary | ICD-10-CM | POA: Diagnosis not present

## 2014-05-07 DIAGNOSIS — I200 Unstable angina: Secondary | ICD-10-CM | POA: Diagnosis not present

## 2014-05-07 MED ORDER — CARVEDILOL 6.25 MG PO TABS: 6.2500 mg | ORAL_TABLET | Freq: Two times a day (BID) | ORAL | Status: DC

## 2014-05-07 MED ORDER — CLOPIDOGREL BISULFATE 75 MG PO TABS: 75.0000 mg | ORAL_TABLET | Freq: Every day | ORAL | Status: DC

## 2014-05-07 MED ORDER — LEVOTHYROXINE SODIUM 100 MCG PO TABS: 100.0000 ug | ORAL_TABLET | Freq: Every day | ORAL | Status: DC

## 2014-05-07 NOTE — Telephone Encounter (Signed)
Pt. Dropped of refill prescription request from [facility] aid.Marland KitchenMarland KitchenPrescriptions were handed to [facility].

## 2014-05-09 NOTE — Telephone Encounter (Signed)
I refilled approved the request for this patient, make remind patient about f/u to have his TSH/free t4 drawn. He has appt for lab on 6/29. Please make sure the lab order is in. Thanks

## 2014-05-10 ENCOUNTER — Encounter (HOSPITAL_COMMUNITY)
Admission: RE | Admit: 2014-05-10 | Discharge: 2014-05-10 | Disposition: A | Discharge status: Home / Self Care | Payer: Medicare Other | Source: Ambulatory Visit | Attending: Cardiology | Admitting: Cardiology

## 2014-05-10 DIAGNOSIS — I200 Unstable angina: Secondary | ICD-10-CM | POA: Diagnosis not present

## 2014-05-10 DIAGNOSIS — R079 Chest pain, unspecified: Secondary | ICD-10-CM | POA: Diagnosis not present

## 2014-05-10 DIAGNOSIS — Z9861 Coronary angioplasty status: Secondary | ICD-10-CM | POA: Diagnosis not present

## 2014-05-10 DIAGNOSIS — I498 Other specified cardiac arrhythmias: Secondary | ICD-10-CM | POA: Diagnosis not present

## 2014-05-10 DIAGNOSIS — Z5189 Encounter for other specified aftercare: Secondary | ICD-10-CM | POA: Diagnosis not present

## 2014-05-10 DIAGNOSIS — I2510 Atherosclerotic heart disease of native coronary artery without angina pectoris: Secondary | ICD-10-CM | POA: Diagnosis not present

## 2014-05-12 ENCOUNTER — Encounter (HOSPITAL_COMMUNITY)
Admission: RE | Admit: 2014-05-12 | Discharge: 2014-05-12 | Disposition: A | Discharge status: Home / Self Care | Payer: Medicare Other | Source: Ambulatory Visit | Attending: Cardiology | Admitting: Cardiology

## 2014-05-12 DIAGNOSIS — I2510 Atherosclerotic heart disease of native coronary artery without angina pectoris: Secondary | ICD-10-CM | POA: Diagnosis not present

## 2014-05-12 DIAGNOSIS — I200 Unstable angina: Secondary | ICD-10-CM | POA: Diagnosis not present

## 2014-05-12 DIAGNOSIS — I498 Other specified cardiac arrhythmias: Secondary | ICD-10-CM | POA: Diagnosis not present

## 2014-05-12 DIAGNOSIS — Z5189 Encounter for other specified aftercare: Secondary | ICD-10-CM | POA: Diagnosis not present

## 2014-05-12 DIAGNOSIS — Z9861 Coronary angioplasty status: Secondary | ICD-10-CM | POA: Diagnosis not present

## 2014-05-12 DIAGNOSIS — R079 Chest pain, unspecified: Secondary | ICD-10-CM | POA: Diagnosis not present

## 2014-05-14 ENCOUNTER — Encounter (HOSPITAL_COMMUNITY)
Admission: RE | Admit: 2014-05-14 | Discharge: 2014-05-14 | Disposition: A | Discharge status: Home / Self Care | Payer: Medicare Other | Source: Ambulatory Visit | Attending: Cardiology | Admitting: Cardiology

## 2014-05-14 DIAGNOSIS — I498 Other specified cardiac arrhythmias: Secondary | ICD-10-CM | POA: Diagnosis not present

## 2014-05-14 DIAGNOSIS — Z5189 Encounter for other specified aftercare: Secondary | ICD-10-CM | POA: Diagnosis not present

## 2014-05-14 DIAGNOSIS — I2510 Atherosclerotic heart disease of native coronary artery without angina pectoris: Secondary | ICD-10-CM | POA: Diagnosis not present

## 2014-05-14 DIAGNOSIS — Z9861 Coronary angioplasty status: Secondary | ICD-10-CM | POA: Diagnosis not present

## 2014-05-14 DIAGNOSIS — R079 Chest pain, unspecified: Secondary | ICD-10-CM | POA: Diagnosis not present

## 2014-05-14 DIAGNOSIS — I200 Unstable angina: Secondary | ICD-10-CM | POA: Diagnosis not present
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
## 2014-05-14 NOTE — Progress Notes (Signed)
George Robbins 63 y.o. male Nutrition Note Spoke with pt.  Nutrition Survey reviewed with pt. Pt is not following the Therapeutic Lifestyle Changes diet. Ways to eat heart healthier discussed. Pt agreed to eat more whole grains and increase fruit/vegetable intake. Pt encouraged to decrease his sugar intake (from coffee, sweet tea, Raisin Bran, Klondike bars), which pt agreed to "think about." Pt wants to lose wt. Pt wt today 73.1 kg, which is up 1.4 kg over the last 2 weeks. Pt was surprised to hear his wt went up and said "maybe I need to cut back on the sugar." Pt has not been actively trying to lose. Wt loss tips briefly reviewed. Pt expressed understanding of the information reviewed. Pt aware of nutrition education classes offered and plans on attending nutrition classes.  Nutrition Diagnosis   Food-and nutrition-related knowledge deficit related to lack of exposure to information as related to diagnosis of: ? CVD ?    Overweight related to excessive energy intake as evidenced by a BMI of 28.0  Nutrition Intervention   Benefits of adopting Therapeutic Lifestyle Changes discussed when Medficts reviewed.   Pt to attend the Portion Distortion class - met 05/12/14   Pt to attend the  ? Nutrition I class                     ? Nutrition II class   Continue client-centered nutrition education by RD, as part of interdisciplinary care.  Goal(s)   Pt to eat a variety of non-starchy vegetables.   Pt to eat more fruit.   Pt to choose more whole grain breads.   Pt to watch out for sweets and added sugar.   Pt to identify food quantities necessary to achieve: ? wt loss to a goal wt of 134-152 lb (60.8-69.0 kg) at graduation from cardiac rehab.   Monitor and Evaluate progress toward nutrition goal with team. Nutrition Risk:  Low   Derek Mound, M.Ed, RD, LDN, CDE 05/14/2014 10:33 AM

## 2014-05-17 ENCOUNTER — Encounter (HOSPITAL_COMMUNITY)
Admission: RE | Admit: 2014-05-17 | Discharge: 2014-05-17 | Disposition: A | Discharge status: Home / Self Care | Payer: Medicare Other | Source: Ambulatory Visit | Attending: Cardiology | Admitting: Cardiology

## 2014-05-17 DIAGNOSIS — Z5189 Encounter for other specified aftercare: Secondary | ICD-10-CM | POA: Diagnosis not present

## 2014-05-17 DIAGNOSIS — Z9861 Coronary angioplasty status: Secondary | ICD-10-CM | POA: Diagnosis not present

## 2014-05-17 DIAGNOSIS — I2510 Atherosclerotic heart disease of native coronary artery without angina pectoris: Secondary | ICD-10-CM | POA: Diagnosis not present

## 2014-05-17 DIAGNOSIS — R079 Chest pain, unspecified: Secondary | ICD-10-CM | POA: Diagnosis not present

## 2014-05-17 DIAGNOSIS — I498 Other specified cardiac arrhythmias: Secondary | ICD-10-CM | POA: Diagnosis not present

## 2014-05-17 DIAGNOSIS — I200 Unstable angina: Secondary | ICD-10-CM | POA: Diagnosis not present

## 2014-05-19 ENCOUNTER — Encounter (HOSPITAL_COMMUNITY)
Admission: RE | Admit: 2014-05-19 | Discharge: 2014-05-19 | Disposition: A | Discharge status: Home / Self Care | Payer: Medicare Other | Source: Ambulatory Visit | Attending: Cardiology | Admitting: Cardiology

## 2014-05-19 DIAGNOSIS — R079 Chest pain, unspecified: Secondary | ICD-10-CM | POA: Diagnosis not present

## 2014-05-19 DIAGNOSIS — I200 Unstable angina: Secondary | ICD-10-CM | POA: Diagnosis not present

## 2014-05-19 DIAGNOSIS — Z9861 Coronary angioplasty status: Secondary | ICD-10-CM | POA: Diagnosis not present

## 2014-05-19 DIAGNOSIS — I498 Other specified cardiac arrhythmias: Secondary | ICD-10-CM | POA: Diagnosis not present

## 2014-05-19 DIAGNOSIS — Z5189 Encounter for other specified aftercare: Secondary | ICD-10-CM | POA: Diagnosis not present

## 2014-05-19 DIAGNOSIS — I2510 Atherosclerotic heart disease of native coronary artery without angina pectoris: Secondary | ICD-10-CM | POA: Diagnosis not present

## 2014-05-21 ENCOUNTER — Encounter (HOSPITAL_COMMUNITY)
Admission: RE | Admit: 2014-05-21 | Discharge: 2014-05-21 | Disposition: A | Discharge status: Home / Self Care | Payer: Medicare Other | Source: Ambulatory Visit | Attending: Cardiology | Admitting: Cardiology

## 2014-05-21 DIAGNOSIS — I498 Other specified cardiac arrhythmias: Secondary | ICD-10-CM | POA: Diagnosis not present

## 2014-05-21 DIAGNOSIS — I2510 Atherosclerotic heart disease of native coronary artery without angina pectoris: Secondary | ICD-10-CM | POA: Diagnosis not present

## 2014-05-21 DIAGNOSIS — Z5189 Encounter for other specified aftercare: Secondary | ICD-10-CM | POA: Diagnosis not present

## 2014-05-21 DIAGNOSIS — R079 Chest pain, unspecified: Secondary | ICD-10-CM | POA: Diagnosis not present

## 2014-05-21 DIAGNOSIS — Z9861 Coronary angioplasty status: Secondary | ICD-10-CM | POA: Diagnosis not present

## 2014-05-21 DIAGNOSIS — I200 Unstable angina: Secondary | ICD-10-CM | POA: Diagnosis not present

## 2014-05-21 NOTE — Progress Notes (Signed)
Pt brought Medicaid card in.  Unclear if his Sister called medicaid on his behalf.  Medicaid office called to determine coverage.  Spoke to [facility] at the call center who confirmed that pt has ongoing coverage with no expiration.  Requested written confirmation of coverage.  Reported that she was unable to do so from the the call center.Cherre Huger, BSN

## 2014-05-24 ENCOUNTER — Other Ambulatory Visit: Payer: Medicare Other

## 2014-05-24 ENCOUNTER — Encounter (HOSPITAL_COMMUNITY)
Admission: RE | Admit: 2014-05-24 | Discharge: 2014-05-24 | Disposition: A | Discharge status: Home / Self Care | Payer: Medicare Other | Source: Ambulatory Visit | Attending: Cardiology | Admitting: Cardiology

## 2014-05-24 DIAGNOSIS — Z9861 Coronary angioplasty status: Secondary | ICD-10-CM | POA: Diagnosis not present

## 2014-05-24 DIAGNOSIS — Z5189 Encounter for other specified aftercare: Secondary | ICD-10-CM | POA: Diagnosis not present

## 2014-05-24 DIAGNOSIS — R079 Chest pain, unspecified: Secondary | ICD-10-CM | POA: Diagnosis not present

## 2014-05-24 DIAGNOSIS — I200 Unstable angina: Secondary | ICD-10-CM | POA: Diagnosis not present

## 2014-05-24 DIAGNOSIS — I498 Other specified cardiac arrhythmias: Secondary | ICD-10-CM | POA: Diagnosis not present

## 2014-05-24 DIAGNOSIS — I2510 Atherosclerotic heart disease of native coronary artery without angina pectoris: Secondary | ICD-10-CM | POA: Diagnosis not present

## 2014-05-26 ENCOUNTER — Encounter (HOSPITAL_COMMUNITY)
Admission: RE | Admit: 2014-05-26 | Discharge: 2014-05-26 | Disposition: A | Discharge status: Home / Self Care | Payer: Medicare Other | Source: Ambulatory Visit | Attending: Cardiology | Admitting: Cardiology

## 2014-05-26 DIAGNOSIS — I200 Unstable angina: Secondary | ICD-10-CM | POA: Diagnosis not present

## 2014-05-26 DIAGNOSIS — I2510 Atherosclerotic heart disease of native coronary artery without angina pectoris: Secondary | ICD-10-CM | POA: Insufficient documentation

## 2014-05-26 DIAGNOSIS — E785 Hyperlipidemia, unspecified: Secondary | ICD-10-CM | POA: Insufficient documentation

## 2014-05-26 DIAGNOSIS — I498 Other specified cardiac arrhythmias: Secondary | ICD-10-CM | POA: Insufficient documentation

## 2014-05-26 DIAGNOSIS — Z5189 Encounter for other specified aftercare: Secondary | ICD-10-CM | POA: Insufficient documentation

## 2014-05-26 DIAGNOSIS — Z9861 Coronary angioplasty status: Secondary | ICD-10-CM | POA: Diagnosis not present

## 2014-05-26 DIAGNOSIS — R079 Chest pain, unspecified: Secondary | ICD-10-CM | POA: Diagnosis not present

## 2014-05-31 ENCOUNTER — Encounter (HOSPITAL_COMMUNITY): Payer: Medicare Other

## 2014-05-31 ENCOUNTER — Telehealth (HOSPITAL_COMMUNITY): Payer: Self-pay | Admitting: Internal Medicine

## 2014-06-02 ENCOUNTER — Encounter (HOSPITAL_COMMUNITY)
Admission: RE | Admit: 2014-06-02 | Discharge: 2014-06-02 | Disposition: A | Discharge status: Home / Self Care | Payer: Medicare Other | Source: Ambulatory Visit | Attending: Cardiology | Admitting: Cardiology

## 2014-06-02 DIAGNOSIS — I200 Unstable angina: Secondary | ICD-10-CM | POA: Diagnosis not present

## 2014-06-02 DIAGNOSIS — Z5189 Encounter for other specified aftercare: Secondary | ICD-10-CM | POA: Diagnosis not present

## 2014-06-02 DIAGNOSIS — R079 Chest pain, unspecified: Secondary | ICD-10-CM | POA: Diagnosis not present

## 2014-06-02 DIAGNOSIS — I2510 Atherosclerotic heart disease of native coronary artery without angina pectoris: Secondary | ICD-10-CM | POA: Diagnosis not present

## 2014-06-02 DIAGNOSIS — Z9861 Coronary angioplasty status: Secondary | ICD-10-CM | POA: Diagnosis not present

## 2014-06-02 DIAGNOSIS — I498 Other specified cardiac arrhythmias: Secondary | ICD-10-CM | POA: Diagnosis not present

## 2014-06-04 ENCOUNTER — Encounter (HOSPITAL_COMMUNITY)
Admission: RE | Admit: 2014-06-04 | Discharge: 2014-06-04 | Disposition: A | Discharge status: Home / Self Care | Payer: Medicare Other | Source: Ambulatory Visit | Attending: Cardiology | Admitting: Cardiology

## 2014-06-04 DIAGNOSIS — Z9861 Coronary angioplasty status: Secondary | ICD-10-CM | POA: Diagnosis not present

## 2014-06-04 DIAGNOSIS — I2510 Atherosclerotic heart disease of native coronary artery without angina pectoris: Secondary | ICD-10-CM | POA: Diagnosis not present

## 2014-06-04 DIAGNOSIS — R079 Chest pain, unspecified: Secondary | ICD-10-CM | POA: Diagnosis not present

## 2014-06-04 DIAGNOSIS — Z5189 Encounter for other specified aftercare: Secondary | ICD-10-CM | POA: Diagnosis not present

## 2014-06-04 DIAGNOSIS — I498 Other specified cardiac arrhythmias: Secondary | ICD-10-CM | POA: Diagnosis not present

## 2014-06-04 DIAGNOSIS — I200 Unstable angina: Secondary | ICD-10-CM | POA: Diagnosis not present

## 2014-06-07 ENCOUNTER — Encounter (HOSPITAL_COMMUNITY)
Admission: RE | Admit: 2014-06-07 | Discharge: 2014-06-07 | Disposition: A | Discharge status: Home / Self Care | Payer: Medicare Other | Source: Ambulatory Visit | Attending: Cardiology | Admitting: Cardiology

## 2014-06-07 DIAGNOSIS — I2510 Atherosclerotic heart disease of native coronary artery without angina pectoris: Secondary | ICD-10-CM | POA: Diagnosis not present

## 2014-06-07 DIAGNOSIS — Z5189 Encounter for other specified aftercare: Secondary | ICD-10-CM | POA: Diagnosis not present

## 2014-06-07 DIAGNOSIS — Z9861 Coronary angioplasty status: Secondary | ICD-10-CM | POA: Diagnosis not present

## 2014-06-07 DIAGNOSIS — R079 Chest pain, unspecified: Secondary | ICD-10-CM | POA: Diagnosis not present

## 2014-06-07 DIAGNOSIS — I200 Unstable angina: Secondary | ICD-10-CM | POA: Diagnosis not present

## 2014-06-07 DIAGNOSIS — I498 Other specified cardiac arrhythmias: Secondary | ICD-10-CM | POA: Diagnosis not present

## 2014-06-08 ENCOUNTER — Ambulatory Visit: Payer: Medicare Other | Attending: Internal Medicine

## 2014-06-08 DIAGNOSIS — E039 Hypothyroidism, unspecified: Secondary | ICD-10-CM

## 2014-06-08 DIAGNOSIS — E785 Hyperlipidemia, unspecified: Secondary | ICD-10-CM

## 2014-06-08 DIAGNOSIS — D649 Anemia, unspecified: Secondary | ICD-10-CM

## 2014-06-08 LAB — COMPLETE METABOLIC PANEL WITH GFR
ALT: 12 U/L (ref 0–53)
AST: 11 U/L (ref 0–37)
Albumin: 4.2 g/dL (ref 3.5–5.2)
Alkaline Phosphatase: 77 U/L (ref 39–117)
BILIRUBIN TOTAL: 0.4 mg/dL (ref 0.2–1.2)
BUN: 14 mg/dL (ref 6–23)
CALCIUM: 9.8 mg/dL (ref 8.4–10.5)
CO2: 28 meq/L (ref 19–32)
CREATININE: 1.05 mg/dL (ref 0.50–1.35)
Chloride: 104 mEq/L (ref 96–112)
GFR, Est African American: 88 mL/min
GFR, Est Non African American: 76 mL/min
GLUCOSE: 91 mg/dL (ref 70–99)
Potassium: 4.4 mEq/L (ref 3.5–5.3)
Sodium: 139 mEq/L (ref 135–145)
Total Protein: 6.8 g/dL (ref 6.0–8.3)

## 2014-06-08 LAB — TSH: TSH: 8.513 u[IU]/mL — AB (ref 0.350–4.500)

## 2014-06-08 LAB — LIPID PANEL
CHOL/HDL RATIO: 5 ratio
Cholesterol: 229 mg/dL — ABNORMAL HIGH (ref 0–200)
HDL: 46 mg/dL (ref >39)
LDL CALC: 155 mg/dL — AB (ref 0–99)
Triglycerides: 139 mg/dL (ref <150)
VLDL: 28 mg/dL (ref 0–40)

## 2014-06-08 LAB — IRON AND TIBC
%SAT: 20 % (ref 20–55)
Iron: 47 ug/dL (ref 42–165)
TIBC: 234 ug/dL (ref 215–435)
UIBC: 187 ug/dL (ref 125–400)

## 2014-06-09 ENCOUNTER — Ambulatory Visit (INDEPENDENT_AMBULATORY_CARE_PROVIDER_SITE_OTHER): Payer: Medicare Other | Admitting: Emergency Medicine

## 2014-06-09 ENCOUNTER — Encounter: Payer: Self-pay | Admitting: Emergency Medicine

## 2014-06-09 ENCOUNTER — Encounter (HOSPITAL_COMMUNITY): Payer: Medicare Other

## 2014-06-09 VITALS — BP 128/80 | HR 77 | Ht 63.5 in | Wt 161.0 lb

## 2014-06-09 DIAGNOSIS — R059 Cough, unspecified: Secondary | ICD-10-CM

## 2014-06-09 DIAGNOSIS — R05 Cough: Secondary | ICD-10-CM

## 2014-06-09 DIAGNOSIS — J449 Chronic obstructive pulmonary disease, unspecified: Secondary | ICD-10-CM

## 2014-06-09 DIAGNOSIS — I200 Unstable angina: Secondary | ICD-10-CM

## 2014-06-09 DIAGNOSIS — R053 Chronic cough: Secondary | ICD-10-CM

## 2014-06-09 MED ORDER — TIOTROPIUM BROMIDE MONOHYDRATE 18 MCG IN CAPS: 18.0000 ug | ORAL_CAPSULE | Freq: Every day | RESPIRATORY_TRACT | Status: DC

## 2014-06-09 NOTE — Progress Notes (Signed)
PFT done today. 

## 2014-06-09 NOTE — Assessment & Plan Note (Signed)
Supported by PFT 7/15 that show mixed disease. Has benefited from spiriva, will continue  - spiriva qd - flu shot this fall - rov6

## 2014-06-09 NOTE — Patient Instructions (Signed)
Please continue your fluticasone nasal spray, protonix, loratadine (Claritin) Continue Spiriva on inhalation daily.  Work on [employer] and exercise to lose some weight Follow with Dr Lamonte Sakai in 6 months or sooner if you have any problems

## 2014-06-09 NOTE — Assessment & Plan Note (Signed)
continue nasal steroid, PPI and loratadine
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
## 2014-06-10 ENCOUNTER — Telehealth: Payer: Self-pay | Admitting: Emergency Medicine

## 2014-06-10 MED ORDER — LEVOTHYROXINE SODIUM 125 MCG PO TABS: 125.0000 ug | ORAL_TABLET | Freq: Every day | ORAL | Status: DC

## 2014-06-10 NOTE — Telephone Encounter (Signed)
Left message for pt to call clinic for lab results with medication instructions Scripts ordered and e-scribed to [facility]

## 2014-06-10 NOTE — Telephone Encounter (Signed)
Message copied by Ricci Barker on Thu Jun 10, 2014  4:12 PM ------      Message from: Chari Manning A      Created: Wed Jun 09, 2014  6:47 PM       Let patient know his cholesterol is still high and find out if he has been taking his simvastatin daily. If he states that he has switch him to atorvastatin 40 mg to be taken daily, and discontinue simvastatin. Also please inform him that his synthroid needs to be increased. Please send synthroid 125 mcg to be taken daily----(Please pay attention to dose when you order) and place order for TSH recheck in 8 weeks. Thanks. ------

## 2014-06-11 ENCOUNTER — Encounter (HOSPITAL_COMMUNITY)
Admission: RE | Admit: 2014-06-11 | Discharge: 2014-06-11 | Disposition: A | Discharge status: Home / Self Care | Payer: Medicare Other | Source: Ambulatory Visit | Attending: Cardiology | Admitting: Cardiology

## 2014-06-11 DIAGNOSIS — Z9861 Coronary angioplasty status: Secondary | ICD-10-CM | POA: Diagnosis not present

## 2014-06-11 DIAGNOSIS — Z5189 Encounter for other specified aftercare: Secondary | ICD-10-CM | POA: Diagnosis not present

## 2014-06-11 DIAGNOSIS — R079 Chest pain, unspecified: Secondary | ICD-10-CM | POA: Diagnosis not present

## 2014-06-11 DIAGNOSIS — I2510 Atherosclerotic heart disease of native coronary artery without angina pectoris: Secondary | ICD-10-CM | POA: Diagnosis not present

## 2014-06-11 DIAGNOSIS — I200 Unstable angina: Secondary | ICD-10-CM | POA: Diagnosis not present

## 2014-06-11 DIAGNOSIS — I498 Other specified cardiac arrhythmias: Secondary | ICD-10-CM | POA: Diagnosis not present

## 2014-06-14 ENCOUNTER — Encounter (HOSPITAL_COMMUNITY)
Admission: RE | Admit: 2014-06-14 | Discharge: 2014-06-14 | Disposition: A | Discharge status: Home / Self Care | Payer: Medicare Other | Source: Ambulatory Visit | Attending: Cardiology | Admitting: Cardiology

## 2014-06-14 DIAGNOSIS — Z9861 Coronary angioplasty status: Secondary | ICD-10-CM | POA: Diagnosis not present

## 2014-06-14 DIAGNOSIS — I2510 Atherosclerotic heart disease of native coronary artery without angina pectoris: Secondary | ICD-10-CM | POA: Diagnosis not present

## 2014-06-14 DIAGNOSIS — Z5189 Encounter for other specified aftercare: Secondary | ICD-10-CM | POA: Diagnosis not present

## 2014-06-14 DIAGNOSIS — I200 Unstable angina: Secondary | ICD-10-CM | POA: Diagnosis not present

## 2014-06-14 DIAGNOSIS — R079 Chest pain, unspecified: Secondary | ICD-10-CM | POA: Diagnosis not present

## 2014-06-14 DIAGNOSIS — I498 Other specified cardiac arrhythmias: Secondary | ICD-10-CM | POA: Diagnosis not present

## 2014-06-16 ENCOUNTER — Encounter (HOSPITAL_COMMUNITY)
Admission: RE | Admit: 2014-06-16 | Discharge: 2014-06-16 | Disposition: A | Discharge status: Home / Self Care | Payer: Medicare Other | Source: Ambulatory Visit | Attending: Cardiology | Admitting: Cardiology

## 2014-06-16 DIAGNOSIS — I200 Unstable angina: Secondary | ICD-10-CM | POA: Diagnosis not present

## 2014-06-16 DIAGNOSIS — Z9861 Coronary angioplasty status: Secondary | ICD-10-CM | POA: Diagnosis not present

## 2014-06-16 DIAGNOSIS — R079 Chest pain, unspecified: Secondary | ICD-10-CM | POA: Diagnosis not present

## 2014-06-16 DIAGNOSIS — I2510 Atherosclerotic heart disease of native coronary artery without angina pectoris: Secondary | ICD-10-CM | POA: Diagnosis not present

## 2014-06-16 DIAGNOSIS — Z5189 Encounter for other specified aftercare: Secondary | ICD-10-CM | POA: Diagnosis not present

## 2014-06-16 DIAGNOSIS — I498 Other specified cardiac arrhythmias: Secondary | ICD-10-CM | POA: Diagnosis not present

## 2014-06-16 LAB — PULMONARY FUNCTION TEST
DL/VA % PRED: 84 %
DL/VA: 3.5 ml/min/mmHg/L
DLCO unc % pred: 62 %
DLCO unc: 14.78 ml/min/mmHg
FEF 25-75 Post: 1.26 L/sec
FEF 25-75 Pre: 1.27 L/sec
FEF2575-%Change-Post: 0 %
FEF2575-%Pred-Post: 55 %
FEF2575-%Pred-Pre: 55 %
FEV1-%CHANGE-POST: -1 %
FEV1-%PRED-POST: 62 %
FEV1-%PRED-PRE: 63 %
FEV1-PRE: 1.49 L
FEV1-Post: 1.47 L
FEV1FVC-%Change-Post: 5 %
FEV1FVC-%PRED-PRE: 103 %
FEV6-%Change-Post: -6 %
FEV6-%Pred-Post: 59 %
FEV6-%Pred-Pre: 63 %
FEV6-Post: 1.73 L
FEV6-Pre: 1.85 L
FEV6FVC-%Change-Post: 0 %
FEV6FVC-%PRED-PRE: 105 %
FEV6FVC-%Pred-Post: 105 %
FVC-%Change-Post: -6 %
FVC-%Pred-Post: 56 %
FVC-%Pred-Pre: 60 %
FVC-Post: 1.73 L
FVC-Pre: 1.85 L
PRE FEV6/FVC RATIO: 100 %
Post FEV1/FVC ratio: 85 %
Post FEV6/FVC ratio: 100 %
Pre FEV1/FVC ratio: 81 %
RV % pred: 124 %
RV: 2.38 L
TLC % PRED: 75 %
TLC: 4.28 L

## 2014-06-18 ENCOUNTER — Encounter (HOSPITAL_COMMUNITY)
Admission: RE | Admit: 2014-06-18 | Discharge: 2014-06-18 | Disposition: A | Discharge status: Home / Self Care | Payer: Medicare Other | Source: Ambulatory Visit | Attending: Cardiology | Admitting: Cardiology

## 2014-06-18 DIAGNOSIS — R079 Chest pain, unspecified: Secondary | ICD-10-CM | POA: Diagnosis not present

## 2014-06-18 DIAGNOSIS — Z5189 Encounter for other specified aftercare: Secondary | ICD-10-CM | POA: Diagnosis not present

## 2014-06-18 DIAGNOSIS — Z9861 Coronary angioplasty status: Secondary | ICD-10-CM | POA: Diagnosis not present

## 2014-06-18 DIAGNOSIS — I200 Unstable angina: Secondary | ICD-10-CM | POA: Diagnosis not present

## 2014-06-18 DIAGNOSIS — I498 Other specified cardiac arrhythmias: Secondary | ICD-10-CM | POA: Diagnosis not present

## 2014-06-18 DIAGNOSIS — I2510 Atherosclerotic heart disease of native coronary artery without angina pectoris: Secondary | ICD-10-CM | POA: Diagnosis not present

## 2014-06-21 ENCOUNTER — Encounter (HOSPITAL_COMMUNITY): Payer: Medicare Other

## 2014-06-23 ENCOUNTER — Encounter (HOSPITAL_COMMUNITY)
Admission: RE | Admit: 2014-06-23 | Discharge: 2014-06-23 | Disposition: A | Discharge status: Home / Self Care | Payer: Medicare Other | Source: Ambulatory Visit | Attending: Cardiology | Admitting: Cardiology

## 2014-06-23 DIAGNOSIS — I200 Unstable angina: Secondary | ICD-10-CM | POA: Diagnosis not present

## 2014-06-23 DIAGNOSIS — I498 Other specified cardiac arrhythmias: Secondary | ICD-10-CM | POA: Diagnosis not present

## 2014-06-23 DIAGNOSIS — Z9861 Coronary angioplasty status: Secondary | ICD-10-CM | POA: Diagnosis not present

## 2014-06-23 DIAGNOSIS — R079 Chest pain, unspecified: Secondary | ICD-10-CM | POA: Diagnosis not present

## 2014-06-23 DIAGNOSIS — Z5189 Encounter for other specified aftercare: Secondary | ICD-10-CM | POA: Diagnosis not present

## 2014-06-23 DIAGNOSIS — I2510 Atherosclerotic heart disease of native coronary artery without angina pectoris: Secondary | ICD-10-CM | POA: Diagnosis not present

## 2014-06-25 ENCOUNTER — Encounter (HOSPITAL_COMMUNITY)
Admission: RE | Admit: 2014-06-25 | Discharge: 2014-06-25 | Disposition: A | Discharge status: Home / Self Care | Payer: Medicare Other | Source: Ambulatory Visit | Attending: Cardiology | Admitting: Cardiology

## 2014-06-25 DIAGNOSIS — Z5189 Encounter for other specified aftercare: Secondary | ICD-10-CM | POA: Diagnosis not present

## 2014-06-25 DIAGNOSIS — R079 Chest pain, unspecified: Secondary | ICD-10-CM | POA: Diagnosis not present

## 2014-06-25 DIAGNOSIS — I2510 Atherosclerotic heart disease of native coronary artery without angina pectoris: Secondary | ICD-10-CM | POA: Diagnosis not present

## 2014-06-25 DIAGNOSIS — Z9861 Coronary angioplasty status: Secondary | ICD-10-CM | POA: Diagnosis not present

## 2014-06-25 DIAGNOSIS — I200 Unstable angina: Secondary | ICD-10-CM | POA: Diagnosis not present

## 2014-06-25 DIAGNOSIS — I498 Other specified cardiac arrhythmias: Secondary | ICD-10-CM | POA: Diagnosis not present

## 2014-06-28 ENCOUNTER — Encounter (HOSPITAL_COMMUNITY): Payer: Medicare Other

## 2014-06-30 ENCOUNTER — Encounter (HOSPITAL_COMMUNITY)
Admission: RE | Admit: 2014-06-30 | Discharge: 2014-06-30 | Disposition: A | Discharge status: Home / Self Care | Payer: Medicare Other | Source: Ambulatory Visit | Attending: Cardiology | Admitting: Cardiology

## 2014-06-30 DIAGNOSIS — Z9861 Coronary angioplasty status: Secondary | ICD-10-CM | POA: Insufficient documentation

## 2014-06-30 DIAGNOSIS — I2510 Atherosclerotic heart disease of native coronary artery without angina pectoris: Secondary | ICD-10-CM | POA: Insufficient documentation

## 2014-06-30 DIAGNOSIS — E785 Hyperlipidemia, unspecified: Secondary | ICD-10-CM | POA: Diagnosis not present

## 2014-06-30 DIAGNOSIS — Z5189 Encounter for other specified aftercare: Secondary | ICD-10-CM | POA: Insufficient documentation

## 2014-06-30 DIAGNOSIS — R079 Chest pain, unspecified: Secondary | ICD-10-CM | POA: Diagnosis not present

## 2014-06-30 DIAGNOSIS — I200 Unstable angina: Secondary | ICD-10-CM | POA: Insufficient documentation

## 2014-06-30 DIAGNOSIS — I498 Other specified cardiac arrhythmias: Secondary | ICD-10-CM | POA: Diagnosis not present

## 2014-07-02 ENCOUNTER — Encounter (HOSPITAL_COMMUNITY)
Admission: RE | Admit: 2014-07-02 | Discharge: 2014-07-02 | Disposition: A | Discharge status: Home / Self Care | Payer: Medicare Other | Source: Ambulatory Visit | Attending: Cardiology | Admitting: Cardiology

## 2014-07-02 DIAGNOSIS — Z5189 Encounter for other specified aftercare: Secondary | ICD-10-CM | POA: Diagnosis not present

## 2014-07-02 DIAGNOSIS — I498 Other specified cardiac arrhythmias: Secondary | ICD-10-CM | POA: Diagnosis not present

## 2014-07-02 DIAGNOSIS — I200 Unstable angina: Secondary | ICD-10-CM | POA: Diagnosis not present

## 2014-07-02 DIAGNOSIS — Z9861 Coronary angioplasty status: Secondary | ICD-10-CM | POA: Diagnosis not present

## 2014-07-02 DIAGNOSIS — I2510 Atherosclerotic heart disease of native coronary artery without angina pectoris: Secondary | ICD-10-CM | POA: Diagnosis not present

## 2014-07-02 DIAGNOSIS — R079 Chest pain, unspecified: Secondary | ICD-10-CM | POA: Diagnosis not present

## 2014-07-05 ENCOUNTER — Encounter (HOSPITAL_COMMUNITY)
Admission: RE | Admit: 2014-07-05 | Discharge: 2014-07-05 | Disposition: A | Discharge status: Home / Self Care | Payer: Medicare Other | Source: Ambulatory Visit | Attending: Cardiology | Admitting: Cardiology

## 2014-07-05 DIAGNOSIS — I2510 Atherosclerotic heart disease of native coronary artery without angina pectoris: Secondary | ICD-10-CM | POA: Diagnosis not present

## 2014-07-05 DIAGNOSIS — Z5189 Encounter for other specified aftercare: Secondary | ICD-10-CM | POA: Diagnosis not present

## 2014-07-05 DIAGNOSIS — R079 Chest pain, unspecified: Secondary | ICD-10-CM | POA: Diagnosis not present

## 2014-07-05 DIAGNOSIS — I498 Other specified cardiac arrhythmias: Secondary | ICD-10-CM | POA: Diagnosis not present

## 2014-07-05 DIAGNOSIS — Z9861 Coronary angioplasty status: Secondary | ICD-10-CM | POA: Diagnosis not present

## 2014-07-05 DIAGNOSIS — I200 Unstable angina: Secondary | ICD-10-CM | POA: Diagnosis not present

## 2014-07-07 ENCOUNTER — Encounter (HOSPITAL_COMMUNITY)
Admission: RE | Admit: 2014-07-07 | Discharge: 2014-07-07 | Disposition: A | Discharge status: Home / Self Care | Payer: Medicare Other | Source: Ambulatory Visit | Attending: Cardiology | Admitting: Cardiology

## 2014-07-07 DIAGNOSIS — I2510 Atherosclerotic heart disease of native coronary artery without angina pectoris: Secondary | ICD-10-CM | POA: Diagnosis not present

## 2014-07-07 DIAGNOSIS — R079 Chest pain, unspecified: Secondary | ICD-10-CM | POA: Diagnosis not present

## 2014-07-07 DIAGNOSIS — I200 Unstable angina: Secondary | ICD-10-CM | POA: Diagnosis not present

## 2014-07-07 DIAGNOSIS — I498 Other specified cardiac arrhythmias: Secondary | ICD-10-CM | POA: Diagnosis not present

## 2014-07-07 DIAGNOSIS — Z9861 Coronary angioplasty status: Secondary | ICD-10-CM | POA: Diagnosis not present

## 2014-07-07 DIAGNOSIS — Z5189 Encounter for other specified aftercare: Secondary | ICD-10-CM | POA: Diagnosis not present

## 2014-07-08 ENCOUNTER — Encounter: Payer: Self-pay | Admitting: Adult Health

## 2014-07-08 ENCOUNTER — Ambulatory Visit (INDEPENDENT_AMBULATORY_CARE_PROVIDER_SITE_OTHER): Payer: Medicare Other | Admitting: Adult Health

## 2014-07-08 VITALS — BP 106/66 | HR 68 | Temp 97.6°F | Ht 63.5 in | Wt 163.4 lb

## 2014-07-08 DIAGNOSIS — I200 Unstable angina: Secondary | ICD-10-CM

## 2014-07-08 DIAGNOSIS — J4489 Other specified chronic obstructive pulmonary disease: Secondary | ICD-10-CM | POA: Diagnosis not present

## 2014-07-08 DIAGNOSIS — J449 Chronic obstructive pulmonary disease, unspecified: Secondary | ICD-10-CM

## 2014-07-08 MED ORDER — LEVALBUTEROL HCL 0.63 MG/3ML IN NEBU
0.6300 mg | INHALATION_SOLUTION | Freq: Once | RESPIRATORY_TRACT | Status: AC
  Administered 2014-07-08: 0.63 mg via RESPIRATORY_TRACT

## 2014-07-08 MED ORDER — AMOXICILLIN-POT CLAVULANATE 875-125 MG PO TABS: 1.0000 | ORAL_TABLET | Freq: Two times a day (BID) | ORAL | Status: AC

## 2014-07-08 MED ORDER — PREDNISONE 10 MG PO TABS: ORAL_TABLET | ORAL | Status: DC

## 2014-07-08 MED ORDER — HYDROCODONE-HOMATROPINE 5-1.5 MG/5ML PO SYRP: 5.0000 mL | ORAL_SOLUTION | Freq: Four times a day (QID) | ORAL | Status: DC | PRN

## 2014-07-08 NOTE — Addendum Note (Signed)
Addended by: Parke Poisson E on: 07/08/2014 12:02 PM   Modules accepted: Orders

## 2014-07-08 NOTE — Assessment & Plan Note (Signed)
Flare w/ bronchitis  >xopenex neb in office   Plan  Augmentin 875mg  Twice daily  For 7 days -take with food.  Mucinex DM Twice daily As needed  Cough/congestion  Prednisone taper over next week.  Hydromet 1 tsp every 6hr As needed  Cough/congestion.  Fluids and rest  Please contact office for sooner follow up if symptoms do not improve or worsen or seek emergency care  Follow up Dr. Lamonte Sakai  As planned and As needed

## 2014-07-08 NOTE — Progress Notes (Signed)
   Subjective:    Patient ID: George Robbins, male    DOB: Feb 21, 1951, 63 y.o.   MRN: [id number]  HPI  63 yo man, former smoker (30 pk-yrs), history of CAD s/p PTCI, GERD, hypertension, hypothyroidism, chronic kidney disease, prior traumatic pneumothorax. He is referred today for chronic cough for past 6-7 months. Has been productive in the past, white; not productive now. Seems to be worst in the am. He is having a lot of sinus drainage, clear mucous. He was placed on QVAR and nasal steroid recently > seems to have helped his breatihng and possibly his cough. He has breakthrough GERD sx. He has benfited from albuterol prn.   ROV 06/09/14 -- follows up for dyspnea and cough. Last time we added loratadine, increased fluticasone and pantoprazole. We also empirically changed his QVAR to spiriva. PFT today  > mixed disease w no BD response, FEV1 63%, decreased DLCO that corrects for Va. His cough may be a bit less but still has it sometimes. The spiriva seems to help him, more relief than QVAR.   07/08/2014 Acute OV -Hx of COPD  Complains of PND, prod cough with white mucus, some wheezing and increased SOB, tightness in chest x 4 days.  Denies f/c/s, n/v/d, hemoptysis,  Not taking any otc meds.  Remains on Spiriva.  Has been coughing so hard at times.  Cough is keeping him up at night.     Review of Systems Constitutional:   No  weight loss, night sweats,  Fevers, chills,  +fatigue, or  lassitude.  HEENT:   No headaches,  Difficulty swallowing,  Tooth/dental problems, or  Sore throat,                No sneezing, itching, ear ache,  +nasal congestion, post nasal drip,   CV:  No chest pain,  Orthopnea, PND, swelling in lower extremities, anasarca, dizziness, palpitations, syncope.   GI  No heartburn, indigestion, abdominal pain, nausea, vomiting, diarrhea, change in bowel habits, loss of appetite, bloody stools.   Resp:  .  No chest wall deformity  Skin: no rash or lesions.  GU: no  dysuria, change in color of urine, no urgency or frequency.  No flank pain, no hematuria   MS:  No joint pain or swelling.  No decreased range of motion.  No back pain.  Psych:  No change in mood or affect. No depression or anxiety.  No memory loss.         Objective:   Physical Exam GEN: A/Ox3; pleasant , NAD, elderly   HEENT:  [place]/AT,  EACs-clear, TMs-wnl, NOSE-clear, THROAT-clear, no lesions, no postnasal drip or exudate noted.   NECK:  Supple w/ fair ROM; no JVD; normal carotid impulses w/o bruits; no thyromegaly or nodules palpated; no lymphadenopathy.  RESP  Trace exp wheezeno accessory muscle use, no dullness to percussion  CARD:  RRR, no m/r/g  , no peripheral edema, pulses intact, no cyanosis or clubbing.  GI:   Soft & nt; nml bowel sounds; no organomegaly or masses detected.  Musco: Warm bil, no deformities or joint swelling noted.   Neuro: alert, no focal deficits noted.    Skin: Warm, no lesions or rashes         Assessment & Plan:

## 2014-07-08 NOTE — Patient Instructions (Signed)
Augmentin 875mg  Twice daily  For 7 days -take with food.  Mucinex DM Twice daily As needed  Cough/congestion  Prednisone taper over next week.  Hydromet 1 tsp every 6hr As needed  Cough/congestion.  Fluids and rest  Please contact office for sooner follow up if symptoms do not improve or worsen or seek emergency care  Follow up Dr. Lamonte Sakai  As planned and As needed

## 2014-07-09 ENCOUNTER — Encounter (HOSPITAL_COMMUNITY): Payer: Medicare Other

## 2014-07-12 ENCOUNTER — Encounter (HOSPITAL_COMMUNITY): Payer: Medicare Other

## 2014-07-14 ENCOUNTER — Encounter (HOSPITAL_COMMUNITY)
Admission: RE | Admit: 2014-07-14 | Discharge: 2014-07-14 | Disposition: A | Discharge status: Home / Self Care | Payer: Medicare Other | Source: Ambulatory Visit | Attending: Cardiology | Admitting: Cardiology

## 2014-07-14 DIAGNOSIS — R079 Chest pain, unspecified: Secondary | ICD-10-CM | POA: Diagnosis not present

## 2014-07-14 DIAGNOSIS — Z9861 Coronary angioplasty status: Secondary | ICD-10-CM | POA: Diagnosis not present

## 2014-07-14 DIAGNOSIS — I2510 Atherosclerotic heart disease of native coronary artery without angina pectoris: Secondary | ICD-10-CM | POA: Diagnosis not present

## 2014-07-14 DIAGNOSIS — I200 Unstable angina: Secondary | ICD-10-CM | POA: Diagnosis not present

## 2014-07-14 DIAGNOSIS — Z5189 Encounter for other specified aftercare: Secondary | ICD-10-CM | POA: Diagnosis not present

## 2014-07-14 DIAGNOSIS — I498 Other specified cardiac arrhythmias: Secondary | ICD-10-CM | POA: Diagnosis not present

## 2014-07-16 ENCOUNTER — Encounter (HOSPITAL_COMMUNITY)
Admission: RE | Admit: 2014-07-16 | Discharge: 2014-07-16 | Disposition: A | Discharge status: Home / Self Care | Payer: Medicare Other | Source: Ambulatory Visit | Attending: Cardiology | Admitting: Cardiology

## 2014-07-16 DIAGNOSIS — I2510 Atherosclerotic heart disease of native coronary artery without angina pectoris: Secondary | ICD-10-CM | POA: Diagnosis not present

## 2014-07-16 DIAGNOSIS — R079 Chest pain, unspecified: Secondary | ICD-10-CM | POA: Diagnosis not present

## 2014-07-16 DIAGNOSIS — I200 Unstable angina: Secondary | ICD-10-CM | POA: Diagnosis not present

## 2014-07-16 DIAGNOSIS — I498 Other specified cardiac arrhythmias: Secondary | ICD-10-CM | POA: Diagnosis not present

## 2014-07-16 DIAGNOSIS — Z9861 Coronary angioplasty status: Secondary | ICD-10-CM | POA: Diagnosis not present

## 2014-07-16 DIAGNOSIS — Z5189 Encounter for other specified aftercare: Secondary | ICD-10-CM | POA: Diagnosis not present

## 2014-07-19 ENCOUNTER — Encounter (HOSPITAL_COMMUNITY)
Admission: RE | Admit: 2014-07-19 | Discharge: 2014-07-19 | Disposition: A | Discharge status: Home / Self Care | Payer: Medicare Other | Source: Ambulatory Visit | Attending: Cardiology | Admitting: Cardiology

## 2014-07-19 DIAGNOSIS — I200 Unstable angina: Secondary | ICD-10-CM | POA: Diagnosis not present

## 2014-07-19 DIAGNOSIS — Z9861 Coronary angioplasty status: Secondary | ICD-10-CM | POA: Diagnosis not present

## 2014-07-19 DIAGNOSIS — R079 Chest pain, unspecified: Secondary | ICD-10-CM | POA: Diagnosis not present

## 2014-07-19 DIAGNOSIS — I498 Other specified cardiac arrhythmias: Secondary | ICD-10-CM | POA: Diagnosis not present

## 2014-07-19 DIAGNOSIS — I2510 Atherosclerotic heart disease of native coronary artery without angina pectoris: Secondary | ICD-10-CM | POA: Diagnosis not present

## 2014-07-19 DIAGNOSIS — Z5189 Encounter for other specified aftercare: Secondary | ICD-10-CM | POA: Diagnosis not present

## 2014-07-21 ENCOUNTER — Encounter (HOSPITAL_COMMUNITY)
Admission: RE | Admit: 2014-07-21 | Discharge: 2014-07-21 | Disposition: A | Discharge status: Home / Self Care | Payer: Medicare Other | Source: Ambulatory Visit | Attending: Cardiology | Admitting: Cardiology

## 2014-07-21 DIAGNOSIS — Z5189 Encounter for other specified aftercare: Secondary | ICD-10-CM | POA: Diagnosis not present

## 2014-07-21 DIAGNOSIS — I2510 Atherosclerotic heart disease of native coronary artery without angina pectoris: Secondary | ICD-10-CM | POA: Diagnosis not present

## 2014-07-21 DIAGNOSIS — Z9861 Coronary angioplasty status: Secondary | ICD-10-CM | POA: Diagnosis not present

## 2014-07-21 DIAGNOSIS — I498 Other specified cardiac arrhythmias: Secondary | ICD-10-CM | POA: Diagnosis not present

## 2014-07-21 DIAGNOSIS — R079 Chest pain, unspecified: Secondary | ICD-10-CM | POA: Diagnosis not present

## 2014-07-21 DIAGNOSIS — I200 Unstable angina: Secondary | ICD-10-CM | POA: Diagnosis not present

## 2014-07-22 ENCOUNTER — Ambulatory Visit: Payer: Medicare Other | Admitting: Internal Medicine

## 2014-07-23 ENCOUNTER — Encounter (HOSPITAL_COMMUNITY)
Admission: RE | Admit: 2014-07-23 | Discharge: 2014-07-23 | Disposition: A | Discharge status: Home / Self Care | Payer: Medicare Other | Source: Ambulatory Visit | Attending: Cardiology | Admitting: Cardiology

## 2014-07-23 DIAGNOSIS — Z9861 Coronary angioplasty status: Secondary | ICD-10-CM | POA: Diagnosis not present

## 2014-07-23 DIAGNOSIS — I2510 Atherosclerotic heart disease of native coronary artery without angina pectoris: Secondary | ICD-10-CM | POA: Diagnosis not present

## 2014-07-23 DIAGNOSIS — R079 Chest pain, unspecified: Secondary | ICD-10-CM | POA: Diagnosis not present

## 2014-07-23 DIAGNOSIS — I200 Unstable angina: Secondary | ICD-10-CM | POA: Diagnosis not present

## 2014-07-23 DIAGNOSIS — Z5189 Encounter for other specified aftercare: Secondary | ICD-10-CM | POA: Diagnosis not present

## 2014-07-23 DIAGNOSIS — I498 Other specified cardiac arrhythmias: Secondary | ICD-10-CM | POA: Diagnosis not present

## 2014-07-23 NOTE — Progress Notes (Signed)
Verlon graduates and plans to continue exercise via walking.

## 2014-07-29 ENCOUNTER — Ambulatory Visit: Payer: Medicare Other | Attending: Internal Medicine | Admitting: Internal Medicine

## 2014-07-29 ENCOUNTER — Encounter: Payer: Self-pay | Admitting: Internal Medicine

## 2014-07-29 VITALS — BP 117/76 | HR 69 | Temp 97.9°F | Resp 16 | Ht 63.0 in | Wt 164.0 lb

## 2014-07-29 DIAGNOSIS — Z833 Family history of diabetes mellitus: Secondary | ICD-10-CM | POA: Diagnosis not present

## 2014-07-29 DIAGNOSIS — E039 Hypothyroidism, unspecified: Secondary | ICD-10-CM | POA: Diagnosis not present

## 2014-07-29 DIAGNOSIS — I10 Essential (primary) hypertension: Secondary | ICD-10-CM | POA: Diagnosis not present

## 2014-07-29 DIAGNOSIS — E785 Hyperlipidemia, unspecified: Secondary | ICD-10-CM | POA: Insufficient documentation

## 2014-07-29 DIAGNOSIS — I129 Hypertensive chronic kidney disease with stage 1 through stage 4 chronic kidney disease, or unspecified chronic kidney disease: Secondary | ICD-10-CM | POA: Diagnosis not present

## 2014-07-29 DIAGNOSIS — I200 Unstable angina: Secondary | ICD-10-CM

## 2014-07-29 DIAGNOSIS — Z87891 Personal history of nicotine dependence: Secondary | ICD-10-CM | POA: Diagnosis not present

## 2014-07-29 DIAGNOSIS — I2510 Atherosclerotic heart disease of native coronary artery without angina pectoris: Secondary | ICD-10-CM | POA: Insufficient documentation

## 2014-07-29 DIAGNOSIS — Z79899 Other long term (current) drug therapy: Secondary | ICD-10-CM | POA: Insufficient documentation

## 2014-07-29 DIAGNOSIS — N183 Chronic kidney disease, stage 3 unspecified: Secondary | ICD-10-CM | POA: Insufficient documentation

## 2014-07-29 DIAGNOSIS — Z800 Family history of malignant neoplasm of digestive organs: Secondary | ICD-10-CM | POA: Insufficient documentation

## 2014-07-29 DIAGNOSIS — Z8249 Family history of ischemic heart disease and other diseases of the circulatory system: Secondary | ICD-10-CM | POA: Insufficient documentation

## 2014-07-29 MED ORDER — FENOFIBRATE 145 MG PO TABS: 145.0000 mg | ORAL_TABLET | Freq: Every day | ORAL | Status: DC

## 2014-07-29 NOTE — Patient Instructions (Signed)
Fenofibrate capsules What is this medicine? FENOFIBRATE (fen oh FYE brate) capsules can help lower blood fats and cholesterol for people who are at risk of getting inflammation of the pancreas (pancreatitis) from having very high amounts of fats in their blood. This medicine is only for patients whose blood fats are not controlled by diet. This medicine may be used for other purposes; ask your health care provider or pharmacist if you have questions. COMMON BRAND NAME(S): Lipofen What should I tell my health care provider before I take this medicine? They need to know if you have any of these conditions: -gallbladder disease -heart disease -kidney disease -liver disease -an unusual or allergic reaction to fenofibrate, gemfibrozil, other medicines, foods, dyes, or preservatives -pregnant or trying to get pregnant -breast-feeding How should I use this medicine? Take this medicine by mouth with a glass of water. Follow the directions on the prescription label. Take with food. Take your medicine at regular intervals. Do not take it more often than directed. Do not stop taking except on your doctor's advice. Talk to your pediatrician regarding the use of this medicine in children. Special care may be needed. Overdosage: If you think you have taken too much of this medicine contact a poison control center or emergency room at once. NOTE: This medicine is only for you. Do not share this medicine with others. What if I miss a dose? If you miss a dose, take it as soon as you can. If it is almost time for your next dose, take only that dose. Do not take double or extra doses. What may interact with this medicine? Do not take this medicine with any of the following medications: -ezetimibe -statin-type cholesterol lowering drugs like atorvastatin, cerivastatin, fluvastatin, lovastatin, pravastatin, or simvastatin -red yeast rice This medicine may also interact with the following  medications: -cholestyramine or colestipol -cyclosporine -warfarin This list may not describe all possible interactions. Give your health care provider a list of all the medicines, herbs, non-prescription drugs, or dietary supplements you use. Also tell them if you smoke, drink alcohol, or use illegal drugs. Some items may interact with your medicine. What should I watch for while using this medicine? Visit your doctor or health care professional for regular checks on your progress. Your blood fat levels and other tests will be measured from time to time. Do not stop taking this medicine except on the advice of your doctor or health care professional. This medicine is only part of a total cholesterol-lowering program. Your health care professional or dietician can suggest a low-cholesterol and low-fat diet that will reduce your risk of getting heart and blood vessel disease. Avoid alcohol and smoking, and keep a proper exercise schedule. If you are diabetic, close regulation and monitoring of your blood sugars can help your blood fat levels. This medicine may change the way your diabetic medicine works, and sometimes will require that your dosages be adjusted. Check with your doctor or health care professional. This medicine can make you more sensitive to the sun. Keep out of the sun. If you cannot avoid being in the sun, wear protective clothing and use sunscreen. Do not use sun lamps or tanning beds/booths. What side effects may I notice from receiving this medicine? Side effects that you should report to your doctor or health care professional as soon as possible: -allergic reactions like skin rash, itching or hives, swelling of the face, lips, or tongue -dark urine -lower back or side pain -muscle pain, tenderness, or weakness -  skin-bruising -stomach pain -trouble passing urine or change in the amount of urine -unusually weak or tired -yellowing of the eyes or skin Side effects that usually  do not require medical attention (report to your doctor or health care professional if they continue or are bothersome): -constipation -headache -nausea This list may not describe all possible side effects. Call your doctor for medical advice about side effects. You may report side effects to FDA at 1-800-FDA-1088. Where should I keep my medicine? Keep out of the reach of children. Store at room temperature between 15 and 30 degrees C (59 and 86 degrees F). Keep container tightly closed. Throw away any unused medicine after the expiration date. NOTE: This sheet is a summary. It may not cover all possible information. If you have questions about this medicine, talk to your doctor, pharmacist, or health care provider.  2015, [employer]/Gold Standard. (2010-09-06 13:57:22)

## 2014-07-29 NOTE — Progress Notes (Signed)
Pt is here following up on his HTN, hypothyroidism, CHF, Cancer and his kidney disease. Pt states that his eyes have been itching and watering a lot.
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
## 2014-07-30 ENCOUNTER — Other Ambulatory Visit: Payer: Self-pay | Admitting: Internal Medicine

## 2014-07-30 LAB — TSH: TSH: 10.147 u[IU]/mL — ABNORMAL HIGH (ref 0.350–4.500)

## 2014-08-05 ENCOUNTER — Ambulatory Visit: Payer: Medicare Other | Attending: Internal Medicine

## 2014-08-05 DIAGNOSIS — E039 Hypothyroidism, unspecified: Secondary | ICD-10-CM

## 2014-08-05 DIAGNOSIS — Z23 Encounter for immunization: Secondary | ICD-10-CM

## 2014-08-05 DIAGNOSIS — Z0000 Encounter for general adult medical examination without abnormal findings: Secondary | ICD-10-CM

## 2014-08-05 MED ORDER — CARVEDILOL 6.25 MG PO TABS
6.2500 mg | ORAL_TABLET | Freq: Two times a day (BID) | ORAL | Status: DC
Start: 2014-08-05 — End: 2014-11-22

## 2014-08-05 MED ORDER — CLOPIDOGREL BISULFATE 75 MG PO TABS: 75.0000 mg | ORAL_TABLET | Freq: Every day | ORAL | Status: DC

## 2014-08-06 LAB — TSH: TSH: 9.508 u[IU]/mL — ABNORMAL HIGH (ref 0.350–4.500)

## 2014-08-10 ENCOUNTER — Observation Stay (HOSPITAL_COMMUNITY): Payer: Medicare Other

## 2014-08-10 ENCOUNTER — Emergency Department (HOSPITAL_COMMUNITY): Payer: Medicare Other

## 2014-08-10 ENCOUNTER — Observation Stay (HOSPITAL_COMMUNITY)
Admission: EM | Admit: 2014-08-10 | Discharge: 2014-08-11 | Disposition: A | Discharge status: Home / Self Care | Payer: Medicare Other | Attending: Internal Medicine | Admitting: Internal Medicine

## 2014-08-10 ENCOUNTER — Encounter (HOSPITAL_COMMUNITY): Payer: Self-pay | Admitting: Emergency Medicine

## 2014-08-10 DIAGNOSIS — J9819 Other pulmonary collapse: Secondary | ICD-10-CM | POA: Diagnosis not present

## 2014-08-10 DIAGNOSIS — I2510 Atherosclerotic heart disease of native coronary artery without angina pectoris: Secondary | ICD-10-CM | POA: Diagnosis not present

## 2014-08-10 DIAGNOSIS — I10 Essential (primary) hypertension: Secondary | ICD-10-CM | POA: Diagnosis not present

## 2014-08-10 DIAGNOSIS — R072 Precordial pain: Secondary | ICD-10-CM | POA: Diagnosis not present

## 2014-08-10 DIAGNOSIS — K219 Gastro-esophageal reflux disease without esophagitis: Secondary | ICD-10-CM

## 2014-08-10 DIAGNOSIS — E039 Hypothyroidism, unspecified: Secondary | ICD-10-CM | POA: Diagnosis not present

## 2014-08-10 DIAGNOSIS — R079 Chest pain, unspecified: Secondary | ICD-10-CM | POA: Diagnosis not present

## 2014-08-10 DIAGNOSIS — Z7982 Long term (current) use of aspirin: Secondary | ICD-10-CM | POA: Diagnosis not present

## 2014-08-10 DIAGNOSIS — Z79899 Other long term (current) drug therapy: Secondary | ICD-10-CM | POA: Insufficient documentation

## 2014-08-10 DIAGNOSIS — R1110 Vomiting, unspecified: Secondary | ICD-10-CM | POA: Diagnosis not present

## 2014-08-10 DIAGNOSIS — N182 Chronic kidney disease, stage 2 (mild): Secondary | ICD-10-CM | POA: Diagnosis not present

## 2014-08-10 DIAGNOSIS — Z87891 Personal history of nicotine dependence: Secondary | ICD-10-CM | POA: Diagnosis not present

## 2014-08-10 DIAGNOSIS — N189 Chronic kidney disease, unspecified: Secondary | ICD-10-CM | POA: Diagnosis not present

## 2014-08-10 DIAGNOSIS — I2584 Coronary atherosclerosis due to calcified coronary lesion: Secondary | ICD-10-CM

## 2014-08-10 DIAGNOSIS — R05 Cough: Secondary | ICD-10-CM | POA: Insufficient documentation

## 2014-08-10 DIAGNOSIS — I129 Hypertensive chronic kidney disease with stage 1 through stage 4 chronic kidney disease, or unspecified chronic kidney disease: Secondary | ICD-10-CM | POA: Insufficient documentation

## 2014-08-10 DIAGNOSIS — IMO0002 Reserved for concepts with insufficient information to code with codable children: Secondary | ICD-10-CM | POA: Insufficient documentation

## 2014-08-10 DIAGNOSIS — E785 Hyperlipidemia, unspecified: Secondary | ICD-10-CM

## 2014-08-10 DIAGNOSIS — R059 Cough, unspecified: Secondary | ICD-10-CM | POA: Insufficient documentation

## 2014-08-10 DIAGNOSIS — R109 Unspecified abdominal pain: Secondary | ICD-10-CM | POA: Diagnosis not present

## 2014-08-10 LAB — BASIC METABOLIC PANEL
Anion gap: 12 (ref 5–15)
BUN: 15 mg/dL (ref 6–23)
CHLORIDE: 103 meq/L (ref 96–112)
CO2: 26 mEq/L (ref 19–32)
CREATININE: 1.28 mg/dL (ref 0.50–1.35)
Calcium: 9.5 mg/dL (ref 8.4–10.5)
GFR, EST AFRICAN AMERICAN: 68 mL/min — AB (ref >90)
GFR, EST NON AFRICAN AMERICAN: 59 mL/min — AB (ref >90)
Glucose, Bld: 93 mg/dL (ref 70–99)
POTASSIUM: 4.3 meq/L (ref 3.7–5.3)
Sodium: 141 mEq/L (ref 137–147)

## 2014-08-10 LAB — CBC
HCT: 35.9 % — ABNORMAL LOW (ref 39.0–52.0)
Hemoglobin: 12.3 g/dL — ABNORMAL LOW (ref 13.0–17.0)
MCH: 27.6 pg (ref 26.0–34.0)
MCHC: 34.3 g/dL (ref 30.0–36.0)
MCV: 80.7 fL (ref 78.0–100.0)
PLATELETS: 158 10*3/uL (ref 150–400)
RBC: 4.45 MIL/uL (ref 4.22–5.81)
RDW: 16 % — AB (ref 11.5–15.5)
WBC: 6.5 10*3/uL (ref 4.0–10.5)

## 2014-08-10 LAB — I-STAT TROPONIN, ED: Troponin i, poc: 0.01 ng/mL (ref 0.00–0.08)

## 2014-08-10 LAB — TROPONIN I: Troponin I: <0.3 ng/mL (ref <0.30)

## 2014-08-10 MED ORDER — FLUTICASONE PROPIONATE 50 MCG/ACT NA SUSP
2.0000 | Freq: Every day | NASAL | Status: DC
  Administered 2014-08-11: 2 via NASAL
  Filled 2014-08-10: qty 16

## 2014-08-10 MED ORDER — ACETAMINOPHEN 325 MG PO TABS: 650.0000 mg | ORAL_TABLET | ORAL | Status: DC | PRN

## 2014-08-10 MED ORDER — TIOTROPIUM BROMIDE MONOHYDRATE 18 MCG IN CAPS
18.0000 ug | ORAL_CAPSULE | Freq: Every day | RESPIRATORY_TRACT | Status: DC
  Administered 2014-08-11: 18 ug via RESPIRATORY_TRACT
  Filled 2014-08-10: qty 5

## 2014-08-10 MED ORDER — ONDANSETRON HCL 4 MG/2ML IJ SOLN: 4.0000 mg | Freq: Four times a day (QID) | INTRAMUSCULAR | Status: DC | PRN

## 2014-08-10 MED ORDER — ALPRAZOLAM 0.25 MG PO TABS: 0.2500 mg | ORAL_TABLET | Freq: Two times a day (BID) | ORAL | Status: DC | PRN

## 2014-08-10 MED ORDER — CARVEDILOL 6.25 MG PO TABS
6.2500 mg | ORAL_TABLET | Freq: Two times a day (BID) | ORAL | Status: DC
  Administered 2014-08-10 – 2014-08-11 (×2): 6.25 mg via ORAL
  Filled 2014-08-10 (×4): qty 1

## 2014-08-10 MED ORDER — CLOPIDOGREL BISULFATE 75 MG PO TABS
75.0000 mg | ORAL_TABLET | Freq: Every day | ORAL | Status: DC
  Administered 2014-08-11: 75 mg via ORAL
  Filled 2014-08-10: qty 1

## 2014-08-10 MED ORDER — ASPIRIN EC 81 MG PO TBEC
81.0000 mg | DELAYED_RELEASE_TABLET | Freq: Every day | ORAL | Status: DC
  Administered 2014-08-11: 81 mg via ORAL
  Filled 2014-08-10 (×2): qty 1

## 2014-08-10 MED ORDER — PANTOPRAZOLE SODIUM 40 MG PO TBEC
40.0000 mg | DELAYED_RELEASE_TABLET | Freq: Every day | ORAL | Status: DC
  Administered 2014-08-11: 40 mg via ORAL
  Filled 2014-08-10: qty 1

## 2014-08-10 MED ORDER — ZOLPIDEM TARTRATE 5 MG PO TABS: 5.0000 mg | ORAL_TABLET | Freq: Every evening | ORAL | Status: DC | PRN

## 2014-08-10 MED ORDER — ACETAMINOPHEN 325 MG PO TABS: 650.0000 mg | ORAL_TABLET | Freq: Four times a day (QID) | ORAL | Status: DC | PRN

## 2014-08-10 MED ORDER — FENOFIBRATE 54 MG PO TABS
54.0000 mg | ORAL_TABLET | Freq: Every day | ORAL | Status: DC
  Administered 2014-08-11: 54 mg via ORAL
  Filled 2014-08-10 (×2): qty 1

## 2014-08-10 MED ORDER — ALBUTEROL SULFATE HFA 108 (90 BASE) MCG/ACT IN AERS: 2.0000 | INHALATION_SPRAY | Freq: Four times a day (QID) | RESPIRATORY_TRACT | Status: DC | PRN

## 2014-08-10 MED ORDER — ENOXAPARIN SODIUM 40 MG/0.4ML ~~LOC~~ SOLN
40.0000 mg | SUBCUTANEOUS | Status: DC
  Administered 2014-08-10: 40 mg via SUBCUTANEOUS
  Filled 2014-08-10 (×2): qty 0.4

## 2014-08-10 MED ORDER — LEVOTHYROXINE SODIUM 100 MCG PO TABS
100.0000 ug | ORAL_TABLET | Freq: Every day | ORAL | Status: DC
  Administered 2014-08-11: 100 ug via ORAL
  Filled 2014-08-10 (×2): qty 1

## 2014-08-10 MED ORDER — LORATADINE 10 MG PO TABS
10.0000 mg | ORAL_TABLET | Freq: Every day | ORAL | Status: DC
  Administered 2014-08-11: 10 mg via ORAL
  Filled 2014-08-10: qty 1
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
## 2014-08-10 NOTE — ED Notes (Signed)
Woke up with nausea pt developed sudden onset sharp pain to center chest intermittent after walking in store  For about 1 hour denies any pain at present
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
## 2014-08-11 DIAGNOSIS — I2584 Coronary atherosclerosis due to calcified coronary lesion: Secondary | ICD-10-CM | POA: Diagnosis not present

## 2014-08-11 DIAGNOSIS — I2510 Atherosclerotic heart disease of native coronary artery without angina pectoris: Secondary | ICD-10-CM | POA: Diagnosis not present

## 2014-08-11 DIAGNOSIS — I10 Essential (primary) hypertension: Secondary | ICD-10-CM | POA: Diagnosis not present

## 2014-08-11 DIAGNOSIS — E785 Hyperlipidemia, unspecified: Secondary | ICD-10-CM | POA: Diagnosis not present

## 2014-08-11 DIAGNOSIS — R079 Chest pain, unspecified: Secondary | ICD-10-CM | POA: Diagnosis not present

## 2014-08-11 DIAGNOSIS — K219 Gastro-esophageal reflux disease without esophagitis: Secondary | ICD-10-CM

## 2014-08-11 LAB — TROPONIN I
Troponin I: <0.3 ng/mL (ref <0.30)
Troponin I: <0.3 ng/mL (ref <0.30)

## 2014-08-11 NOTE — Progress Notes (Signed)
Patient Name: George Robbins Date of Encounter: 08/11/2014     Principal Problem:   Chest pain with moderate risk for cardiac etiology Active Problems:   HTN (hypertension)   GERD (gastroesophageal reflux disease)   Hyperlipidemia    SUBJECTIVE  Reiterated that this episode of cp felt different from the episode in May. Lasted 1 hr yesterday before resolving, no recurrence overnight. No SOB  CURRENT MEDS . aspirin EC  81 mg Oral Daily  . carvedilol  6.25 mg Oral BID WC  . clopidogrel  75 mg Oral Q breakfast  . enoxaparin (LOVENOX) injection  40 mg Subcutaneous Q24H  . fenofibrate  54 mg Oral Daily  . fluticasone  2 spray Each Nare Daily  . levothyroxine  100 mcg Oral QAC breakfast  . loratadine  10 mg Oral Daily  . pantoprazole  40 mg Oral Daily  . tiotropium  18 mcg Inhalation Daily    OBJECTIVE  Filed Vitals:   08/10/14 1815 08/10/14 1902 08/10/14 2140 08/11/14 0426  BP: 103/63 114/63 123/85 107/57  Pulse: 86 70 94 77  Temp:  97.9 F (36.6 C) 98.3 F (36.8 C) 97.6 F (36.4 C)  TempSrc:  Oral Oral Oral  Resp: 21  20 20   Height:      Weight:    155 lb 12.8 oz (70.67 kg)  SpO2: 97%  100% 100%    Intake/Output Summary (Last 24 hours) at 08/11/14 0815 Last data filed at 08/11/14 0425  Gross per 24 hour  Intake      0 ml  Output    750 ml  Net   -750 ml   Filed Weights   08/10/14 1420 08/11/14 0426  Weight: 170 lb (77.111 kg) 155 lb 12.8 oz (70.67 kg)    PHYSICAL EXAM  General: Pleasant, NAD. Neuro: Alert and oriented X 3. Moves all extremities spontaneously. Psych: Normal affect. HEENT:  Normal  Neck: Supple without bruits or JVD. Lungs:  Resp regular and unlabored, CTA. Heart: RRR no s3, s4, or murmurs. Abdomen: Soft, non-tender, non-distended, BS + x 4.  Extremities: No clubbing, cyanosis or edema. DP/PT/Radials 2+ and equal bilaterally.  Accessory Clinical Findings  CBC  Recent Labs  08/10/14 1500  WBC 6.5  HGB 12.3*  HCT 35.9*    MCV 80.7  PLT 259   Basic Metabolic Panel  Recent Labs  08/10/14 1500  NA 141  K 4.3  CL 103  CO2 26  GLUCOSE 93  BUN 15  CREATININE 1.28  CALCIUM 9.5   Cardiac Enzymes  Recent Labs  08/10/14 08/10/14 1755 08/11/14 0641  TROPONINI <0.30 <0.30 <0.30     TELE NSR with HR 70-90s, no significant ventricular ectopy    ECG  NSR with HR 70s, TWI in lead V4-V6  Echocardiogram  03/30/2014 LV EF: 45% - 50%  ------------------------------------------------------------ Indications: Chest pain 786.51. Dyspnea 786.09.  ------------------------------------------------------------ History: Risk factors: Hypertension.  ------------------------------------------------------------ Study Conclusions  Left ventricle: The cavity size was normal. Wall thickness was normal. Systolic function was mildly reduced. The estimated ejection fraction was in the range of 45% to 50%. Diffuse hypokinesis. Doppler parameters are consistent with abnormal left ventricular relaxation (grade 1 diastolic dysfunction).      Radiology/Studies  US Abdomen Complete  08/10/2014   CLINICAL DATA:  Upper abdominal pain.  Chronic renal disease.  EXAM: ULTRASOUND ABDOMEN COMPLETE  COMPARISON:  CT of the abdomen and pelvis from 01/24/2012  FINDINGS: Gallbladder:  No gallstones or wall thickening visualized. No  sonographic Murphy sign noted.  Common bile duct:  Diameter: 0.3 cm, within normal limits in caliber.  Liver:  No focal lesion identified. Within normal limits in parenchymal echogenicity.  IVC:  No abnormality visualized.  Pancreas:  Visualized portion unremarkable.  Spleen:  Size and appearance within normal limits.  Right Kidney:  Length: 9.5 cm. Echogenicity within normal limits. No mass or hydronephrosis visualized.  Left Kidney:  Length: 9.4 cm. Echogenicity within normal limits. No mass or hydronephrosis visualized.  Abdominal aorta:  No aneurysm visualized. Incompletely assessed due to  overlying bowel gas.  Other findings:  None.  IMPRESSION: Unremarkable abdominal ultrasound. Evaluation mildly suboptimal due to overlying bowel gas.   Electronically Signed   By: Garald Balding M.D.   On: 08/10/2014 21:29   Dg Chest Port 1 View  08/10/2014   CLINICAL DATA:  Chest pain  EXAM: PORTABLE CHEST - 1 VIEW  COMPARISON:  Portable chest x-ray dated Mar 29, 2014  FINDINGS: The lungs are slightly less well inflated today but the film was obtained in a lordotic position. The cardiopericardial silhouette remains top normal in size. The pulmonary vascularity is mildly prominent centrally. There is no pleural effusion. The observed bony thorax exhibits no acute abnormality.  IMPRESSION: There is no definite evidence of pneumonia nor pulmonary edema. Low-grade compensated CHF cannot be excluded.   Electronically Signed   By: David  Martinique   On: 08/10/2014 16:23    ASSESSMENT AND PLAN  1. Atypical chest pain  - serial enzyme negative overnight, no EKG changes, abdominal u/s negative  - likely ok to discharge without further workup (per Dr. Clayborne Dana consult) if can ambulate this morning without chest pain. Pending seen by Dr. Marlou Porch, remain NPO for now  2. CAD  - DES to LAD and OM in May 2015  3. HTN 4. Hypothyroidism 5. GERD   Signed, Almyra Deforest PA-C Pager: [phone]  Personally seen and examined. Agree with above. OK for DC Non cardiac CP Ambulated 5 times hall way this am and did great. No CP.  Candee Furbish, MD

## 2014-08-11 NOTE — Discharge Summary (Signed)
Discharge Summary   Patient ID: George Robbins,  MRN: [id number], DOB/AGE: 1951/04/22 63 y.o.  Admit date: 08/10/2014 Discharge date: 08/11/2014  Primary Care Provider: Angelica Chessman Primary Cardiologist: Dr. Marlou Porch  Discharge Diagnoses Principal Problem:   Chest pain with moderate risk for cardiac etiology Active Problems:   HTN (hypertension)   GERD (gastroesophageal reflux disease)   Hyperlipidemia   Allergies Allergies  Allergen Reactions  . Simvastatin     Rash     Procedures  RUQ Ultrasound Gallbladder:  No gallstones or wall thickening visualized. No sonographic Murphy  sign noted.  Common bile duct:  Diameter: 0.3 cm, within normal limits in caliber.  Liver:  No focal lesion identified. Within normal limits in parenchymal  echogenicity.  IVC:  No abnormality visualized.  Pancreas:  Visualized portion unremarkable.  Spleen:  Size and appearance within normal limits.  Right Kidney:  Length: 9.5 cm. Echogenicity within normal limits. No mass or  hydronephrosis visualized.  Left Kidney:  Length: 9.4 cm. Echogenicity within normal limits. No mass or  hydronephrosis visualized.  Abdominal aorta:  No aneurysm visualized. Incompletely assessed due to overlying bowel  gas.  Other findings:  None.  IMPRESSION:  Unremarkable abdominal ultrasound. Evaluation mildly suboptimal due  to overlying bowel gas.      Hospital Course  The patient is a 63 year old male with past medical history significant for hypertension, hypothyroidism, GERD, and a history of coronary artery disease status post drug-eluting stent to LAD and OM in May 2015. Patient presented to [facility] on 08/10/2014 after presented with one hour chest pain shortly after eating greasy food. According to the patient, the characteristic of the chest pain was distinctly different from the previous angina. However given his history, he was admitted and kept overnight for observation.  Serial troponins overnight were negative. Telemetry showed no significant ectopy. A right upper quadrant ultrasound was also obtained which did not reveal any significant abdominal etiology for the chest pain.   Patient allowed to ambulate in the morning of 08/11/2014 without significant chest discomfort. He is deemed stable for discharge from cardiology perspective. I have scheduled a followup for the patient with Dr. Acie Fredrickson in November. Patient will be discharged to continue previous home medication.   Discharge Vitals Blood pressure 107/57, pulse 77, temperature 97.6 F (36.4 C), temperature source Oral, resp. rate 20, height 5\' 8"  (1.727 m), weight 155 lb 12.8 oz (70.67 kg), SpO2 100.00%.  Filed Weights   08/10/14 1420 08/11/14 0426  Weight: 170 lb (77.111 kg) 155 lb 12.8 oz (70.67 kg)    Labs  CBC  Recent Labs  08/10/14 1500  WBC 6.5  HGB 12.3*  HCT 35.9*  MCV 80.7  PLT 631   Basic Metabolic Panel  Recent Labs  08/10/14 1500  NA 141  K 4.3  CL 103  CO2 26  GLUCOSE 93  BUN 15  CREATININE 1.28  CALCIUM 9.5   Cardiac Enzymes  Recent Labs  08/10/14 08/10/14 1755 08/11/14 0641  TROPONINI <0.30 <0.30 <0.30   Disposition  Pt is being discharged home today in good condition.  Follow-up Plans & Appointments  Follow-up Information   Follow up with Darden Amber., MD On 10/12/2014. (4:15pm)    Specialty:  Cardiology   Contact information:   [address] [place] [address] [phone]       Discharge Medications    Medication List         acetaminophen 325 MG tablet  Commonly known as:  TYLENOL  Take 650 mg by mouth every 6 (six) hours as needed for moderate pain.     albuterol 108 (90 BASE) MCG/ACT inhaler  Commonly known as:  PROVENTIL HFA;VENTOLIN HFA  Inhale 2 puffs into the lungs every 6 (six) hours as needed for wheezing or shortness of breath. For cough     aspirin EC 81 MG tablet  Take 81 mg by mouth daily.      carvedilol 6.25 MG tablet  Commonly known as:  COREG  Take 1 tablet (6.25 mg total) by mouth 2 (two) times daily with a meal.     clopidogrel 75 MG tablet  Commonly known as:  PLAVIX  Take 1 tablet (75 mg total) by mouth daily with breakfast.     fenofibrate 145 MG tablet  Commonly known as:  TRICOR  Take 1 tablet (145 mg total) by mouth daily.     fluticasone 50 MCG/ACT nasal spray  Commonly known as:  FLONASE  Place 2 sprays into both nostrils daily. For allergies     levothyroxine 100 MCG tablet  Commonly known as:  SYNTHROID, LEVOTHROID  Take 100 mcg by mouth daily before breakfast.     loratadine 10 MG tablet  Commonly known as:  CLARITIN  Take 1 tablet (10 mg total) by mouth daily.     nitroGLYCERIN 0.4 MG SL tablet  Commonly known as:  NITROSTAT  Place 1 tablet (0.4 mg total) under the tongue every 5 (five) minutes as needed for chest pain.     pantoprazole 40 MG tablet  Commonly known as:  PROTONIX  Take 40 mg by mouth daily.     tiotropium 18 MCG inhalation capsule  Commonly known as:  SPIRIVA  Place 1 capsule (18 mcg total) into inhaler and inhale daily.        Duration of Discharge Encounter   Greater than 30 minutes including physician time.  Hilbert Corrigan PA-C Pager: [phone] 08/11/2014, 9:19 AM  Personally seen and examined. Agree with above. Agree with discharge without further cardiac workup (per Dr. Clayborne Dana H&P) since can ambulate this morning without chest pain.  DES to LAD and OM in May 2015 Non cardiac CP  Ambulated 5 times hall way this am and did great. No CP  SKAINS, MARK, MD

## 2014-08-11 NOTE — Discharge Instructions (Signed)

## 2014-08-11 NOTE — Progress Notes (Signed)
Utilization Review Completed.George Robbins T9/16/2015  

## 2014-09-07 ENCOUNTER — Ambulatory Visit (HOSPITAL_COMMUNITY): Payer: Medicare Other | Attending: Family Medicine

## 2014-09-07 ENCOUNTER — Emergency Department (INDEPENDENT_AMBULATORY_CARE_PROVIDER_SITE_OTHER)
Admission: EM | Admit: 2014-09-07 | Discharge: 2014-09-07 | Disposition: A | Discharge status: Home / Self Care | Payer: Medicare Other | Source: Home / Self Care | Attending: Family Medicine | Admitting: Family Medicine

## 2014-09-07 ENCOUNTER — Encounter (HOSPITAL_COMMUNITY): Payer: Self-pay | Admitting: Emergency Medicine

## 2014-09-07 DIAGNOSIS — R079 Chest pain, unspecified: Secondary | ICD-10-CM | POA: Insufficient documentation

## 2014-09-07 DIAGNOSIS — R05 Cough: Secondary | ICD-10-CM | POA: Insufficient documentation

## 2014-09-07 DIAGNOSIS — R1110 Vomiting, unspecified: Secondary | ICD-10-CM | POA: Insufficient documentation

## 2014-09-07 DIAGNOSIS — J449 Chronic obstructive pulmonary disease, unspecified: Secondary | ICD-10-CM | POA: Diagnosis not present

## 2014-09-07 DIAGNOSIS — R918 Other nonspecific abnormal finding of lung field: Secondary | ICD-10-CM | POA: Diagnosis not present

## 2014-09-07 MED ORDER — AZITHROMYCIN 250 MG PO TABS: 250.0000 mg | ORAL_TABLET | Freq: Every day | ORAL | Status: DC

## 2014-09-07 MED ORDER — ALBUTEROL SULFATE HFA 108 (90 BASE) MCG/ACT IN AERS: 2.0000 | INHALATION_SPRAY | Freq: Four times a day (QID) | RESPIRATORY_TRACT | Status: DC | PRN

## 2014-09-07 MED ORDER — IPRATROPIUM-ALBUTEROL 0.5-2.5 (3) MG/3ML IN SOLN
3.0000 mL | Freq: Once | RESPIRATORY_TRACT | Status: AC
  Administered 2014-09-07: 3 mL via RESPIRATORY_TRACT

## 2014-09-07 MED ORDER — PREDNISONE 50 MG PO TABS: 50.0000 mg | ORAL_TABLET | Freq: Every day | ORAL | Status: DC

## 2014-09-07 MED ORDER — IPRATROPIUM-ALBUTEROL 0.5-2.5 (3) MG/3ML IN SOLN
RESPIRATORY_TRACT | Status: AC
  Filled 2014-09-07: qty 3

## 2014-09-07 MED ORDER — HYDROCODONE-ACETAMINOPHEN 5-325 MG PO TABS: 0.5000 | ORAL_TABLET | Freq: Every evening | ORAL | Status: DC | PRN

## 2014-09-07 NOTE — ED Notes (Signed)
Patient transported to X-ray.  Notified George Robbins in [facility] radiology that patient coming
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
## 2014-09-07 NOTE — ED Notes (Signed)
Patient has coughing episodes for a year, associated with vomiting when coughing very hard.  Sister with patient and reports this cough/vomiting is getting worse significantly over the past week.

## 2014-09-07 NOTE — Discharge Instructions (Signed)
Thank you for coming in today. Use albuterol as needed.  Take prednisone and azithromycin daily for 5 days.  Use the hydrocodone cough medicine as needed.  Follow up with your doctor.  Call or go to the emergency room if you get worse, have trouble breathing, have chest pains, or palpitations.   Chronic Obstructive Pulmonary Disease Exacerbation  Chronic obstructive pulmonary disease (COPD) is a common lung problem. In COPD, the flow of air from the lungs is limited. COPD exacerbations are times that breathing gets worse and you need extra treatment. Without treatment they can be life threatening. If they happen often, your lungs can become more damaged. HOME CARE  Do not smoke.  Avoid tobacco smoke and other things that bother your lungs.  If given, take your antibiotic medicine as told. Finish the medicine even if you start to feel better.  Only take medicines as told by your doctor.  Drink enough fluids to keep your pee (urine) clear or pale yellow (unless your doctor has told you not to).  Use a cool mist machine (vaporizer).  If you use oxygen or a machine that turns liquid medicine into a mist (nebulizer), continue to use them as told.  Keep up with shots (vaccinations) as told by your doctor.  Exercise regularly.  Eat healthy foods.  Keep all doctor visits as told. GET HELP RIGHT AWAY IF:  You are very short of breath and it gets worse.  You have trouble talking.  You have bad chest pain.  You have blood in your spit (sputum).  You have a fever.  You keep throwing up (vomiting).  You feel weak, or you pass out (faint).  You feel confused.  You keep getting worse. MAKE SURE YOU:   Understand these instructions.  Will watch your condition.  Will get help right away if you are not doing well or get worse. Document Released: 11/01/2011 Document Revised: 09/02/2013 Document Reviewed: 07/17/2013 [facility] Patient Information 2015 [place], [place]. This  information is not intended to replace advice given to you by your health care provider. Make sure you discuss any questions you have with your health care provider.

## 2014-09-08 ENCOUNTER — Telehealth (HOSPITAL_COMMUNITY): Payer: Self-pay

## 2014-09-08 NOTE — ED Notes (Addendum)
Pt. called back and said he was seen yesterday and still has cough and vomiting. He said Medicaid will not pay for cough medicine but he would pay out of pocket. I verified that he is taking the Zithromax, Prednisone and Hydrocodone 1/2 @ HS for cough. I told him he is taking 2 things that will help with the cough. He can't expect symptoms to resolve in 24 hrs. I told him to keep taking his medication and he should be better in 4-5 days.

## 2014-10-05 ENCOUNTER — Other Ambulatory Visit: Payer: Self-pay | Admitting: Internal Medicine

## 2014-10-12 ENCOUNTER — Encounter: Payer: Self-pay | Admitting: Cardiovascular Disease

## 2014-10-12 ENCOUNTER — Telehealth: Payer: Self-pay | Admitting: Internal Medicine

## 2014-10-12 ENCOUNTER — Ambulatory Visit (INDEPENDENT_AMBULATORY_CARE_PROVIDER_SITE_OTHER): Payer: Medicare Other | Admitting: Cardiovascular Disease

## 2014-10-12 ENCOUNTER — Other Ambulatory Visit: Payer: Self-pay | Admitting: Emergency Medicine

## 2014-10-12 ENCOUNTER — Encounter: Payer: Medicare Other | Admitting: Cardiovascular Disease

## 2014-10-12 VITALS — BP 128/82 | HR 74 | Ht 64.0 in | Wt 169.8 lb

## 2014-10-12 DIAGNOSIS — I2510 Atherosclerotic heart disease of native coronary artery without angina pectoris: Secondary | ICD-10-CM | POA: Diagnosis not present

## 2014-10-12 DIAGNOSIS — I200 Unstable angina: Secondary | ICD-10-CM

## 2014-10-12 DIAGNOSIS — E785 Hyperlipidemia, unspecified: Secondary | ICD-10-CM | POA: Diagnosis not present

## 2014-10-12 MED ORDER — LEVOTHYROXINE SODIUM 100 MCG PO TABS: 100.0000 ug | ORAL_TABLET | Freq: Every day | ORAL | Status: DC

## 2014-10-12 MED ORDER — ATORVASTATIN CALCIUM 40 MG PO TABS: 40.0000 mg | ORAL_TABLET | Freq: Every day | ORAL | Status: DC

## 2014-10-12 NOTE — Telephone Encounter (Signed)
Medication e-scribed to [facility] pharmacy

## 2014-10-12 NOTE — Telephone Encounter (Signed)
Pt's son called to request a refill for levothyroxine (SYNTHROID, LEVOTHROID) 100 MCG tablet. Please f/u with pt.

## 2014-10-12 NOTE — Assessment & Plan Note (Signed)
His LDL cholesterol is 155. We will start him on atorvastatin 40 mg a day. We'll check repeat labs in 3 months. I have encouraged him to try eating low-fat foods.

## 2014-10-12 NOTE — Assessment & Plan Note (Signed)
He seems to be doing well. Is not having any further episodes of chest discomfort.

## 2014-10-12 NOTE — Patient Instructions (Signed)
Please try to eat foods that are baked , broiled, or grilled, Avoid fried foods.  We have started you on the atorvastatin 40 mg by mouth please take 1 each night.  You will need  to return in 3 months for blood work.  WE will see you in 6 months for follow up visit.
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
## 2014-11-04 ENCOUNTER — Encounter (HOSPITAL_COMMUNITY): Payer: Self-pay | Admitting: Cardiovascular Disease

## 2014-11-05 ENCOUNTER — Other Ambulatory Visit: Payer: Self-pay | Admitting: Internal Medicine

## 2014-11-22 ENCOUNTER — Other Ambulatory Visit: Payer: Self-pay | Admitting: Internal Medicine

## 2015-01-06 ENCOUNTER — Other Ambulatory Visit: Payer: Self-pay | Admitting: Internal Medicine

## 2015-01-31 ENCOUNTER — Other Ambulatory Visit: Payer: Self-pay | Admitting: Emergency Medicine

## 2015-02-21 ENCOUNTER — Other Ambulatory Visit: Payer: Self-pay | Admitting: Internal Medicine

## 2015-03-03 ENCOUNTER — Ambulatory Visit: Payer: Medicare Other | Attending: Internal Medicine | Admitting: Internal Medicine

## 2015-03-03 ENCOUNTER — Encounter: Payer: Self-pay | Admitting: Internal Medicine

## 2015-03-03 VITALS — BP 124/80 | HR 79 | Temp 98.1°F | Resp 16 | Ht 64.0 in | Wt 178.0 lb

## 2015-03-03 DIAGNOSIS — Z7951 Long term (current) use of inhaled steroids: Secondary | ICD-10-CM | POA: Diagnosis not present

## 2015-03-03 DIAGNOSIS — Z23 Encounter for immunization: Secondary | ICD-10-CM

## 2015-03-03 DIAGNOSIS — K219 Gastro-esophageal reflux disease without esophagitis: Secondary | ICD-10-CM | POA: Diagnosis not present

## 2015-03-03 DIAGNOSIS — I2510 Atherosclerotic heart disease of native coronary artery without angina pectoris: Secondary | ICD-10-CM | POA: Insufficient documentation

## 2015-03-03 DIAGNOSIS — I10 Essential (primary) hypertension: Secondary | ICD-10-CM

## 2015-03-03 DIAGNOSIS — I129 Hypertensive chronic kidney disease with stage 1 through stage 4 chronic kidney disease, or unspecified chronic kidney disease: Secondary | ICD-10-CM | POA: Insufficient documentation

## 2015-03-03 DIAGNOSIS — N189 Chronic kidney disease, unspecified: Secondary | ICD-10-CM | POA: Diagnosis not present

## 2015-03-03 DIAGNOSIS — E039 Hypothyroidism, unspecified: Secondary | ICD-10-CM | POA: Insufficient documentation

## 2015-03-03 DIAGNOSIS — Z79899 Other long term (current) drug therapy: Secondary | ICD-10-CM | POA: Diagnosis not present

## 2015-03-03 DIAGNOSIS — Z7982 Long term (current) use of aspirin: Secondary | ICD-10-CM | POA: Insufficient documentation

## 2015-03-03 DIAGNOSIS — Z7902 Long term (current) use of antithrombotics/antiplatelets: Secondary | ICD-10-CM | POA: Insufficient documentation

## 2015-03-03 DIAGNOSIS — Z87891 Personal history of nicotine dependence: Secondary | ICD-10-CM | POA: Diagnosis not present

## 2015-03-03 LAB — CBC
HCT: 42.3 % (ref 39.0–52.0)
Hemoglobin: 14.2 g/dL (ref 13.0–17.0)
MCH: 28.8 pg (ref 26.0–34.0)
MCHC: 33.6 g/dL (ref 30.0–36.0)
MCV: 85.8 fL (ref 78.0–100.0)
MPV: 10.7 fL (ref 8.6–12.4)
PLATELETS: 193 10*3/uL (ref 150–400)
RBC: 4.93 MIL/uL (ref 4.22–5.81)
RDW: 15.5 % (ref 11.5–15.5)
WBC: 9.2 10*3/uL (ref 4.0–10.5)

## 2015-03-03 LAB — LIPID PANEL
Cholesterol: 175 mg/dL (ref 0–200)
HDL: 38 mg/dL — ABNORMAL LOW (ref >=40)
LDL CALC: 105 mg/dL — AB (ref 0–99)
Total CHOL/HDL Ratio: 4.6 Ratio
Triglycerides: 160 mg/dL — ABNORMAL HIGH (ref <150)
VLDL: 32 mg/dL (ref 0–40)

## 2015-03-03 LAB — COMPLETE METABOLIC PANEL WITH GFR
ALBUMIN: 4.4 g/dL (ref 3.5–5.2)
ALT: 25 U/L (ref 0–53)
AST: 24 U/L (ref 0–37)
Alkaline Phosphatase: 93 U/L (ref 39–117)
BUN: 11 mg/dL (ref 6–23)
CHLORIDE: 101 meq/L (ref 96–112)
CO2: 24 mEq/L (ref 19–32)
Calcium: 9.9 mg/dL (ref 8.4–10.5)
Creat: 1.04 mg/dL (ref 0.50–1.35)
GFR, EST NON AFRICAN AMERICAN: 77 mL/min
GFR, Est African American: 89 mL/min
Glucose, Bld: 86 mg/dL (ref 70–99)
Potassium: 4.6 mEq/L (ref 3.5–5.3)
Sodium: 139 mEq/L (ref 135–145)
Total Bilirubin: 0.5 mg/dL (ref 0.2–1.2)
Total Protein: 7.3 g/dL (ref 6.0–8.3)

## 2015-03-03 LAB — T4, FREE: Free T4: 1.24 ng/dL (ref 0.80–1.80)

## 2015-03-03 LAB — TSH: TSH: 4.208 u[IU]/mL (ref 0.350–4.500)

## 2015-03-03 MED ORDER — ATORVASTATIN CALCIUM 40 MG PO TABS: 40.0000 mg | ORAL_TABLET | Freq: Every day | ORAL | Status: DC

## 2015-03-03 MED ORDER — CARVEDILOL 6.25 MG PO TABS: 6.2500 mg | ORAL_TABLET | Freq: Two times a day (BID) | ORAL | Status: DC

## 2015-03-03 MED ORDER — PANTOPRAZOLE SODIUM 40 MG PO TBEC: 40.0000 mg | DELAYED_RELEASE_TABLET | Freq: Every day | ORAL | Status: DC

## 2015-03-03 MED ORDER — CLOPIDOGREL BISULFATE 75 MG PO TABS: ORAL_TABLET | ORAL | Status: DC

## 2015-03-03 MED ORDER — ASPIRIN EC 81 MG PO TBEC: 81.0000 mg | DELAYED_RELEASE_TABLET | Freq: Every day | ORAL | Status: DC
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
## 2015-03-03 NOTE — Progress Notes (Signed)
Pt is here following up on his GERD, HTN and his kidney disease. Pt states that his allergy's are kicking up.

## 2015-03-07 ENCOUNTER — Telehealth: Payer: Self-pay | Admitting: *Deleted

## 2015-03-07 NOTE — Telephone Encounter (Signed)
Pt is aware pf his lab results.

## 2015-03-07 NOTE — Telephone Encounter (Signed)
-----   Message from Lance Bosch, NP sent at 03/04/2015 10:05 PM EDT ----- Cholesterol is improving quite a bit. Please continue medications. May refill his fenofibrate if I did not on last visit

## 2015-03-21 ENCOUNTER — Telehealth: Payer: Self-pay | Admitting: *Deleted

## 2015-03-21 NOTE — Telephone Encounter (Signed)
Patient called in complaining of a terrible cough that is so strong that he is vomiting.  I looked back in his chart and it seems patient saw the pulmonologist in 06/2014 who addressed his cough.  i recommended patient make a follow up appointment with Dr. Lamonte Sakai, his "lung doctor".  I gave patient his phone number and asked if he had been taking his allergy medication.  Patient unable to tell me.  He says he does not read very well and he asked if it was a little white pill.  Patient states his son helps him with everything and that he is not home at the moment.  I told him to have his son call me when he got home.  I then encouraged the patient to make his "lung doctor' appointment.  Patient said he would.

## 2015-03-22 ENCOUNTER — Ambulatory Visit (INDEPENDENT_AMBULATORY_CARE_PROVIDER_SITE_OTHER): Payer: Medicare Other | Admitting: Adult Health

## 2015-03-22 ENCOUNTER — Encounter: Payer: Self-pay | Admitting: Adult Health

## 2015-03-22 VITALS — BP 104/68 | HR 78 | Temp 98.1°F | Ht 64.0 in | Wt 175.8 lb

## 2015-03-22 DIAGNOSIS — J441 Chronic obstructive pulmonary disease with (acute) exacerbation: Secondary | ICD-10-CM

## 2015-03-22 DIAGNOSIS — J449 Chronic obstructive pulmonary disease, unspecified: Secondary | ICD-10-CM | POA: Diagnosis not present

## 2015-03-22 DIAGNOSIS — I2510 Atherosclerotic heart disease of native coronary artery without angina pectoris: Secondary | ICD-10-CM

## 2015-03-22 MED ORDER — LEVALBUTEROL HCL 0.63 MG/3ML IN NEBU
0.6300 mg | INHALATION_SOLUTION | Freq: Once | RESPIRATORY_TRACT | Status: AC
  Administered 2015-03-22: 0.63 mg via RESPIRATORY_TRACT

## 2015-03-22 MED ORDER — PREDNISONE 10 MG PO TABS: ORAL_TABLET | ORAL | Status: DC

## 2015-03-22 MED ORDER — HYDROCODONE-HOMATROPINE 5-1.5 MG/5ML PO SYRP: 5.0000 mL | ORAL_SOLUTION | Freq: Four times a day (QID) | ORAL | Status: DC | PRN

## 2015-03-22 MED ORDER — DOXYCYCLINE HYCLATE 100 MG PO TABS: 100.0000 mg | ORAL_TABLET | Freq: Two times a day (BID) | ORAL | Status: DC

## 2015-03-22 NOTE — Assessment & Plan Note (Signed)
Exacerbation  xopenex neb x 1   Plan  Hold fish oil.  Add pepcid 20mg  At bedtime   Doxycycline 100mg  Twice daily  For 7 days  Prednisone taper over next week.  Mucinex DM Twice daily  As needed  Cough .  Hydromet 1/2 tsp every 6hr as needed for cough , may make sleepy Please contact office for sooner follow up if symptoms do not improve or worsen or seek emergency care  follow up Dr. Lamonte Sakai  In 6 weeks and As needed

## 2015-03-22 NOTE — Progress Notes (Signed)
Subjective:    Patient ID: George Robbins, male    DOB: [birth year], 64 y.o.   MRN: [id number]  HPI  64 yo man, former smoker (30 pk-yrs), history of CAD s/p PTCI, GERD, hypertension, hypothyroidism, chronic kidney disease, prior traumatic pneumothorax. He is referred today for chronic cough for past 6-7 months. Has been productive in the past, white; not productive now. Seems to be worst in the am. He is having a lot of sinus drainage, clear mucous. He was placed on QVAR and nasal steroid recently > seems to have helped his breatihng and possibly his cough. He has breakthrough GERD sx. He has benfited from albuterol prn.   ROV 06/09/14 -- follows up for dyspnea and cough. Last time we added loratadine, increased fluticasone and pantoprazole. We also empirically changed his QVAR to spiriva. PFT today  > mixed disease w no BD response, FEV1 63%, decreased DLCO that corrects for Va. His cough may be a bit less but still has it sometimes. The spiriva seems to help him, more relief than QVAR.   06/2014 Acute OV -Hx of COPD  Complains of PND, prod cough with white mucus, some wheezing and increased SOB, tightness in chest x 4 days.  Denies f/c/s, n/v/d, hemoptysis, Not taking any otc meds.  Remains on Spiriva. Has been coughing so hard at times. Cough is keeping him up at night.  >augmentin rx   03/22/2015 Acute OV : COPD  Patient presents for an acute office visit Patient complains of 1 week of cough, congestion, drainage, with thick white mucus.  Has severe cough to point of vomitting mucus.  Taking claritin and mucinex without any help.  He remains on Spiriva daily. Previously on QVAR without perceived benefit.  He denies any chest pain, fever, orthopnea, PND, leg swelling, hemoptysis or unintentional weight loss. Last chest x-ray October 2015 with no acute changes.   Review of Systems Constitutional:   No  weight loss, night sweats,  Fevers, chills,  +fatigue, or  lassitude.  HEENT:    No headaches,  Difficulty swallowing,  Tooth/dental problems, or  Sore throat,                No sneezing, itching, ear ache,  +nasal congestion, post nasal drip,   CV:  No chest pain,  Orthopnea, PND, swelling in lower extremities, anasarca, dizziness, palpitations, syncope.   GI  No heartburn, indigestion, abdominal pain, nausea, vomiting, diarrhea, change in bowel habits, loss of appetite, bloody stools.   Resp:  .  No chest wall deformity  Skin: no rash or lesions.  GU: no dysuria, change in color of urine, no urgency or frequency.  No flank pain, no hematuria   MS:  No joint pain or swelling.  No decreased range of motion.  No back pain.  Psych:  No change in mood or affect. No depression or anxiety.  No memory loss.         Objective:   Physical Exam GEN: A/Ox3; pleasant , NAD, elderly   HEENT:  [place]/AT,  EACs-clear, TMs-wnl, NOSE-clear, THROAT-clear, no lesions, no postnasal drip or exudate noted.   NECK:  Supple w/ fair ROM; no JVD; normal carotid impulses w/o bruits; no thyromegaly or nodules palpated; no lymphadenopathy.  RESP  Trace exp wheezeno accessory muscle use, no dullness to percussion  CARD:  RRR, no m/r/g  , no peripheral edema, pulses intact, no cyanosis or clubbing.  GI:   Soft & nt; nml bowel sounds; no organomegaly  or masses detected.  Musco: Warm bil, no deformities or joint swelling noted.   Neuro: alert, no focal deficits noted.    Skin: Warm, no lesions or rashes         Assessment & Plan:

## 2015-03-22 NOTE — Patient Instructions (Addendum)
Hold fish oil.  Add pepcid 20mg  At bedtime   Doxycycline 100mg  Twice daily  For 7 days  Prednisone taper over next week.  Mucinex DM Twice daily  As needed  Cough .  Hydromet 1/2 tsp every 6hr as needed for cough , may make sleepy Please contact office for sooner follow up if symptoms do not improve or worsen or seek emergency care  follow up Dr. Lamonte Sakai  In 6 weeks and As needed

## 2015-04-11 ENCOUNTER — Other Ambulatory Visit: Payer: Self-pay | Admitting: Internal Medicine

## 2015-04-14 ENCOUNTER — Other Ambulatory Visit: Payer: Self-pay | Admitting: *Deleted

## 2015-04-14 ENCOUNTER — Telehealth: Payer: Self-pay | Admitting: Internal Medicine

## 2015-04-14 MED ORDER — LEVOTHYROXINE SODIUM 100 MCG PO TABS: ORAL_TABLET | ORAL | Status: DC

## 2015-04-14 NOTE — Telephone Encounter (Signed)
Patient called to request a med refill for levothyroxine (SYNTHROID, LEVOTHROID) 100 MCG tablet, please f/u with pt.

## 2015-05-03 ENCOUNTER — Ambulatory Visit (INDEPENDENT_AMBULATORY_CARE_PROVIDER_SITE_OTHER): Payer: Medicare Other | Admitting: Emergency Medicine

## 2015-05-03 ENCOUNTER — Other Ambulatory Visit (HOSPITAL_COMMUNITY): Payer: Self-pay | Admitting: Emergency Medicine

## 2015-05-03 ENCOUNTER — Encounter: Payer: Self-pay | Admitting: Emergency Medicine

## 2015-05-03 VITALS — BP 140/82 | HR 67 | Ht 64.0 in | Wt 180.0 lb

## 2015-05-03 DIAGNOSIS — R1310 Dysphagia, unspecified: Secondary | ICD-10-CM

## 2015-05-03 DIAGNOSIS — I2510 Atherosclerotic heart disease of native coronary artery without angina pectoris: Secondary | ICD-10-CM

## 2015-05-03 DIAGNOSIS — R053 Chronic cough: Secondary | ICD-10-CM

## 2015-05-03 DIAGNOSIS — J449 Chronic obstructive pulmonary disease, unspecified: Secondary | ICD-10-CM | POA: Diagnosis not present

## 2015-05-03 DIAGNOSIS — R05 Cough: Secondary | ICD-10-CM

## 2015-05-03 DIAGNOSIS — R059 Cough, unspecified: Secondary | ICD-10-CM

## 2015-05-03 MED ORDER — HYDROCODONE-HOMATROPINE 5-1.5 MG/5ML PO SYRP: 5.0000 mL | ORAL_SOLUTION | Freq: Four times a day (QID) | ORAL | Status: DC | PRN

## 2015-05-03 NOTE — Progress Notes (Signed)
Subjective:    Patient ID: George Robbins, male    DOB: 07/21/1951, 64 y.o.   MRN: [id number]  HPI  64 yo man, former smoker (30 pk-yrs), history of CAD s/p PTCI, GERD, hypertension, hypothyroidism, chronic kidney disease, prior traumatic pneumothorax. He is referred today for chronic cough for past 6-7 months. Has been productive in the past, white; not productive now. Seems to be worst in the am. He is having a lot of sinus drainage, clear mucous. He was placed on QVAR and nasal steroid recently > seems to have helped his breatihng and possibly his cough. He has breakthrough GERD sx. He has benfited from albuterol prn.   ROV 06/09/14 -- follows up for dyspnea and cough. Last time we added loratadine, increased fluticasone and pantoprazole. We also empirically changed his QVAR to spiriva. PFT today  > mixed disease w no BD response, FEV1 63%, decreased DLCO that corrects for Va. His cough may be a bit less but still has it sometimes. The spiriva seems to help him, more relief than QVAR.   06/2014 Acute OV -Hx of COPD  Complains of PND, prod cough with white mucus, some wheezing and increased SOB, tightness in chest x 4 days.  Denies f/c/s, n/v/d, hemoptysis, Not taking any otc meds.  Remains on Spiriva. Has been coughing so hard at times. Cough is keeping him up at night.  >augmentin rx   Acute OV : COPD 03/22/15 --  Patient presents for an acute office visit Patient complains of 1 week of cough, congestion, drainage, with thick white mucus.  Has severe cough to point of vomitting mucus.  Taking claritin and mucinex without any help.  He remains on Spiriva daily. Previously on QVAR without perceived benefit.  He denies any chest pain, fever, orthopnea, PND, leg swelling, hemoptysis or unintentional weight loss. Last chest x-ray October 2015 with no acute changes.  ROV 05/03/15 -- follows for mixed obstruction and restriction, cough. Former tobacco.  History is taken from the patient and  his son. He is here for regular f/u, having exertional SOB, especially with walking uphill. Can also be associated with chest "soreness". He has some cough that happens w eating.  Has had more cough w white phlegm He is on Spiriva. He has ventolin available, uses it most days. He is taking loratadine and fluticasone. Was treated with pred and doxy in 4/'16 by TP. Having some dysphagia, tickle in throat, occasionally getting stuck.    Review of Systems Constitutional:   No  weight loss, night sweats,  Fevers, chills,  +fatigue, or  lassitude.  HEENT:   No headaches,  Difficulty swallowing,  Tooth/dental problems, or  Sore throat,                No sneezing, itching, ear ache,  +nasal congestion, post nasal drip,   CV:  No chest pain,  Orthopnea, PND, swelling in lower extremities, anasarca, dizziness, palpitations, syncope.   GI  No heartburn, indigestion, abdominal pain, nausea, vomiting, diarrhea, change in bowel habits, loss of appetite, bloody stools.   Resp:  .  No chest wall deformity  Skin: no rash or lesions.  GU: no dysuria, change in color of urine, no urgency or frequency.  No flank pain, no hematuria   MS:  No joint pain or swelling.  No decreased range of motion.  No back pain.  Psych:  No change in mood or affect. No depression or anxiety.  No memory loss.  Objective:   Physical Exam  Filed Vitals:   05/03/15 0917 05/03/15 0918  BP:  140/82  Pulse:  67  Height: 5\' 4"  (1.626 m)   Weight: 180 lb (81.647 kg)   SpO2:  94%    GEN: A/Ox3; pleasant , NAD, elderly   HEENT:  [place]/AT,   NOSE-clear, THROAT-clear, no lesions, no postnasal drip or exudate noted.   RESP  Clear bilaterally.   CARD:  RRR, no m/r/g  , no peripheral edema, pulses intact, no cyanosis or clubbing.  Musco: Warm bil, no deformities or joint swelling noted.   Neuro: alert, no focal deficits noted.    Skin: Warm, no lesions or rashes    Assessment & Plan:  COPD (chronic obstructive  pulmonary disease) Appears to be fairly well controlled. There is no evidence of an acute exacerbation although he is having continued cough. I consider changing him to Spiriva Respimat but his insurance does not cover.    Chronic cough We will continue his nasal allergy regimen as ordered. I am concerned that he may be having retained secretions or food as he is complaining of dysphagia. We will order a modified barium swallow and then follow-up after it is completed to review. In the meantime I'll give him a refill of his cough syrup

## 2015-05-03 NOTE — Assessment & Plan Note (Signed)
Appears to be fairly well controlled. There is no evidence of an acute exacerbation although he is having continued cough. I consider changing him to Spiriva Respimat but his insurance does not cover.

## 2015-05-03 NOTE — Assessment & Plan Note (Signed)
We will continue his nasal allergy regimen as ordered. I am concerned that he may be having retained secretions or food as he is complaining of dysphagia. We will order a modified barium swallow and then follow-up after it is completed to review. In the meantime I'll give him a refill of his cough syrup

## 2015-05-03 NOTE — Patient Instructions (Signed)
Please continue your Spiriva once a day Please continue albuterol as needed for shortness of breath and coughing We will order a modified barium swallow test We will refill your cough syrup. Please use this as needed to suppress your cough Follow with Dr Lamonte Sakai in 1 month to review your swallowing test.

## 2015-05-16 ENCOUNTER — Inpatient Hospital Stay (HOSPITAL_COMMUNITY): Admission: RE | Admit: 2015-05-16 | Payer: Medicare Other | Source: Ambulatory Visit

## 2015-05-16 ENCOUNTER — Ambulatory Visit (HOSPITAL_COMMUNITY)
Admission: RE | Admit: 2015-05-16 | Discharge: 2015-05-16 | Disposition: A | Discharge status: Home / Self Care | Payer: Medicare Other | Source: Ambulatory Visit | Attending: Emergency Medicine | Admitting: Emergency Medicine

## 2015-05-16 ENCOUNTER — Ambulatory Visit (HOSPITAL_COMMUNITY): Payer: Medicare Other

## 2015-05-16 DIAGNOSIS — R1313 Dysphagia, pharyngeal phase: Secondary | ICD-10-CM | POA: Diagnosis not present

## 2015-05-16 DIAGNOSIS — R05 Cough: Secondary | ICD-10-CM

## 2015-05-16 DIAGNOSIS — R1310 Dysphagia, unspecified: Secondary | ICD-10-CM | POA: Diagnosis not present

## 2015-05-16 DIAGNOSIS — R059 Cough, unspecified: Secondary | ICD-10-CM
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
## 2015-06-10 ENCOUNTER — Encounter: Payer: Self-pay | Admitting: Emergency Medicine

## 2015-06-10 ENCOUNTER — Ambulatory Visit (INDEPENDENT_AMBULATORY_CARE_PROVIDER_SITE_OTHER): Payer: Medicare Other | Admitting: Emergency Medicine

## 2015-06-10 VITALS — BP 124/80 | HR 67 | Ht 63.0 in | Wt 181.0 lb

## 2015-06-10 DIAGNOSIS — R05 Cough: Secondary | ICD-10-CM | POA: Diagnosis not present

## 2015-06-10 DIAGNOSIS — I2510 Atherosclerotic heart disease of native coronary artery without angina pectoris: Secondary | ICD-10-CM

## 2015-06-10 DIAGNOSIS — R053 Chronic cough: Secondary | ICD-10-CM

## 2015-06-10 DIAGNOSIS — J438 Other emphysema: Secondary | ICD-10-CM

## 2015-06-10 MED ORDER — FLUTICASONE PROPIONATE 50 MCG/ACT NA SUSP: 2.0000 | Freq: Every day | NASAL | Status: DC

## 2015-06-10 NOTE — Progress Notes (Signed)
Subjective:    Patient ID: George Robbins, male    DOB: [birth year], 64 y.o.   MRN: [id number]  Cough   64 yo man, former smoker (30 pk-yrs), history of CAD s/p PTCI, GERD, hypertension, hypothyroidism, chronic kidney disease, prior traumatic pneumothorax. He is referred today for chronic cough for past 6-7 months. Has been productive in the past, white; not productive now. Seems to be worst in the am. He is having a lot of sinus drainage, clear mucous. He was placed on QVAR and nasal steroid recently > seems to have helped his breatihng and possibly his cough. He has breakthrough GERD sx. He has benfited from albuterol prn.   ROV 06/09/14 -- follows up for dyspnea and cough. Last time we added loratadine, increased fluticasone and pantoprazole. We also empirically changed his QVAR to spiriva. PFT today  > mixed disease w no BD response, FEV1 63%, decreased DLCO that corrects for Va. His cough may be a bit less but still has it sometimes. The spiriva seems to help him, more relief than QVAR.   06/2014 Acute OV -Hx of COPD  Complains of PND, prod cough with white mucus, some wheezing and increased SOB, tightness in chest x 4 days.  Denies f/c/s, n/v/d, hemoptysis, Not taking any otc meds.  Remains on Spiriva. Has been coughing so hard at times. Cough is keeping him up at night.  >augmentin rx   Acute OV : COPD 03/22/15 --  Patient presents for an acute office visit Patient complains of 1 week of cough, congestion, drainage, with thick white mucus.  Has severe cough to point of vomitting mucus.  Taking claritin and mucinex without any help.  He remains on Spiriva daily. Previously on QVAR without perceived benefit.  He denies any chest pain, fever, orthopnea, PND, leg swelling, hemoptysis or unintentional weight loss. Last chest x-ray October 2015 with no acute changes.  ROV 05/03/15 -- follows for mixed obstruction and restriction, cough. Former tobacco.  History is taken from the patient  and his son. He is here for regular f/u, having exertional SOB, especially with walking uphill. Can also be associated with chest "soreness". He has some cough that happens w eating.  Has had more cough w white phlegm He is on Spiriva. He has ventolin available, uses it most days. He is taking loratadine and fluticasone. Was treated with pred and doxy in 4/'16 by TP. Having some dysphagia, tickle in throat, occasionally getting stuck.   ROV 06/10/15 -- follow-up visit for mixed obstruction and restriction, chronic cough. He has been maintained on Spiriva and albuterol as needed. Since our last visit he underwent a modified barium swallow and I personally reviewed the results today. He had minimal to mild pharyngeal dysphagia and a delayed swallow initiation without any obvious laryngeal penetration or aspiration. He was started on swallowing and reflux precautions and a normal diet. His cough is a bit better but still happens w meals. He has been reliable with his Spiriva.  Needs refill on albuterol and nasal spray    Review of Systems  Respiratory: Positive for cough.    Constitutional:   No  weight loss, night sweats,  Fevers, chills,  +fatigue, or  lassitude.  HEENT:   No headaches,  Difficulty swallowing,  Tooth/dental problems, or  Sore throat,                No sneezing, itching, ear ache,  +nasal congestion, post nasal drip,   CV:  No chest  pain,  Orthopnea, PND, swelling in lower extremities, anasarca, dizziness, palpitations, syncope.   GI  No heartburn, indigestion, abdominal pain, nausea, vomiting, diarrhea, change in bowel habits, loss of appetite, bloody stools.   Resp:  .  No chest wall deformity  Skin: no rash or lesions.  GU: no dysuria, change in color of urine, no urgency or frequency.  No flank pain, no hematuria   MS:  No joint pain or swelling.  No decreased range of motion.  No back pain.  Psych:  No change in mood or affect. No depression or anxiety.  No memory  loss.         Objective:   Physical Exam  Filed Vitals:   06/10/15 1346  BP: 124/80  Pulse: 67  Height: 5\' 3"  (1.6 m)  Weight: 82.101 kg (181 lb)  SpO2: 92%    GEN: A/Ox3; pleasant , NAD, elderly   HEENT:  [place]/AT,   NOSE-clear, THROAT-clear, no lesions, no postnasal drip or exudate noted.   RESP  Clear bilaterally.   CARD:  RRR, no m/r/g  , no peripheral edema, pulses intact, no cyanosis or clubbing.  Musco: Warm bil, no deformities or joint swelling noted.   Neuro: alert, no focal deficits noted.    Skin: Warm, no lesions or rashes    Assessment & Plan:  COPD (chronic obstructive pulmonary disease) (HCC) Stable on current BD regimen.   Chronic cough Suspect contributions of chronic LPR, GERD. Modified barium study supports this as well as possible mild dysphagia. Swallowing precautions reviewed with him.

## 2015-06-10 NOTE — Patient Instructions (Addendum)
Please continue your current medications as you have been taking them Continue your swallowing precautions as instructed Follow with Dr Lamonte Sakai in 6 months or sooner if you have any problems

## 2015-07-09 ENCOUNTER — Other Ambulatory Visit: Payer: Self-pay | Admitting: Emergency Medicine

## 2015-07-09 ENCOUNTER — Other Ambulatory Visit: Payer: Self-pay | Admitting: Internal Medicine

## 2015-07-26 ENCOUNTER — Other Ambulatory Visit: Payer: Self-pay | Admitting: Internal Medicine

## 2015-07-29 NOTE — Telephone Encounter (Signed)
Patient is requesting a refill for levothyroxine (SYNTHROID, LEVOTHROID) 100 MCG tablet. Please follow up with pt.

## 2015-08-03 ENCOUNTER — Telehealth: Payer: Self-pay | Admitting: Emergency Medicine

## 2015-08-03 MED ORDER — AMOXICILLIN-POT CLAVULANATE 875-125 MG PO TABS: 1.0000 | ORAL_TABLET | Freq: Two times a day (BID) | ORAL | Status: DC

## 2015-08-03 MED ORDER — PREDNISONE 10 MG PO TABS: ORAL_TABLET | ORAL | Status: DC

## 2015-08-03 NOTE — Telephone Encounter (Signed)
Pt aware of recs. meds called in to preferred pharmacy.  appt made with MW on Friday at pt's request.  Nothing further needed.

## 2015-08-03 NOTE — Telephone Encounter (Signed)
He is at high risk for aspiration and aspiration pneumonia. He needs to be evaluated with a chest x-ray. We can send in prescriptions for him but I strongly recommend that if we don't have an opening in our office that he needs to go be seen in the ED or at urgent care in order to have a chest x-ray  augmentin 875mg  bid x 7 days Prednisone taper: Take 40mg  daily for 3 days, then 30mg  daily for 3 days, then 20mg  daily for 3 days, then 10mg  daily for 3 days, then stop

## 2015-08-03 NOTE — Telephone Encounter (Signed)
Spoke with the pt's sister Reginold Agent  She states that pt has been c/o increased cough x 3 days  He is coughing up large amounts of clear sputum, and coughs so violently at times he almost vomits  He has not noticed any change in his breathing, and denies CP, f/c/s, wheezing or other co's  We have no openings this wk  Please advise recs thanks! Allergies  Allergen Reactions  . Simvastatin     Rash

## 2015-08-05 ENCOUNTER — Ambulatory Visit (INDEPENDENT_AMBULATORY_CARE_PROVIDER_SITE_OTHER)
Admission: RE | Admit: 2015-08-05 | Discharge: 2015-08-05 | Disposition: A | Discharge status: Home / Self Care | Payer: Medicare Other | Source: Ambulatory Visit | Attending: Internal Medicine | Admitting: Internal Medicine

## 2015-08-05 ENCOUNTER — Ambulatory Visit (INDEPENDENT_AMBULATORY_CARE_PROVIDER_SITE_OTHER): Payer: Medicare Other | Admitting: Internal Medicine

## 2015-08-05 ENCOUNTER — Encounter: Payer: Self-pay | Admitting: Internal Medicine

## 2015-08-05 VITALS — BP 102/70 | HR 77 | Temp 97.9°F | Ht 63.0 in | Wt 174.0 lb

## 2015-08-05 DIAGNOSIS — R053 Chronic cough: Secondary | ICD-10-CM

## 2015-08-05 DIAGNOSIS — K219 Gastro-esophageal reflux disease without esophagitis: Secondary | ICD-10-CM | POA: Diagnosis not present

## 2015-08-05 DIAGNOSIS — R0602 Shortness of breath: Secondary | ICD-10-CM | POA: Diagnosis not present

## 2015-08-05 DIAGNOSIS — E669 Obesity, unspecified: Secondary | ICD-10-CM | POA: Diagnosis not present

## 2015-08-05 DIAGNOSIS — I2510 Atherosclerotic heart disease of native coronary artery without angina pectoris: Secondary | ICD-10-CM

## 2015-08-05 DIAGNOSIS — R05 Cough: Secondary | ICD-10-CM

## 2015-08-05 MED ORDER — TRAMADOL HCL 50 MG PO TABS: ORAL_TABLET | ORAL | Status: DC

## 2015-08-05 MED ORDER — PANTOPRAZOLE SODIUM 40 MG PO TBEC: DELAYED_RELEASE_TABLET | ORAL | Status: DC

## 2015-08-05 NOTE — Progress Notes (Signed)
Subjective:    Patient ID: ARTICE BERGERSON, male    DOB: 1951-05-26, 64 y.o.   MRN: [id number]  Cough   64 yo man, former smoker (30 pk-yrs), history of CAD s/p PTCI, GERD, hypertension, hypothyroidism, chronic kidney disease, prior traumatic pneumothorax. He is referred today for chronic cough for past 6-7 months. Has been productive in the past, white; not productive now. Seems to be worst in the am. He is having a lot of sinus drainage, clear mucous. He was placed on QVAR and nasal steroid recently > seems to have helped his breatihng and possibly his cough. He has breakthrough GERD sx. He has benfited from albuterol prn.   ROV 06/09/14 -- follows up for dyspnea and cough. Last time we added loratadine, increased fluticasone and pantoprazole. We also empirically changed his QVAR to spiriva. PFT today  > mixed disease w no BD response, FEV1 63%, decreased DLCO that corrects for Va. His cough may be a bit less but still has it sometimes. The spiriva seems to help him, more relief than QVAR.   06/2014 Acute OV -Hx of COPD  Complains of PND, prod cough with white mucus, some wheezing and increased SOB, tightness in chest x 4 days.  Denies f/c/s, n/v/d, hemoptysis, Not taking any otc meds.  Remains on Spiriva. Has been coughing so hard at times. Cough is keeping him up at night.  >augmentin rx   Acute OV : COPD 03/22/15 --  Patient presents for an acute office visit Patient complains of 1 week of cough, congestion, drainage, with thick white mucus.  Has severe cough to point of vomitting mucus.  Taking claritin and mucinex without any help.  He remains on Spiriva daily. Previously on QVAR without perceived benefit.  He denies any chest pain, fever, orthopnea, PND, leg swelling, hemoptysis or unintentional weight loss. Last chest x-ray October 2015 with no acute changes.  ROV 05/03/15 -- follows for mixed obstruction and restriction, cough. Former tobacco.  History is taken from the patient  and his son. He is here for regular f/u, having exertional SOB, especially with walking uphill. Can also be associated with chest "soreness". He has some cough that happens w eating.  Has had more cough w white phlegm He is on Spiriva. He has ventolin available, uses it most days. He is taking loratadine and fluticasone. Was treated with pred and doxy in 4/'16 by TP. Having some dysphagia, tickle in throat, occasionally getting stuck.   ROV 06/10/15 -- follow-up visit for mixed obstruction and restriction, chronic cough. He has been maintained on Spiriva and albuterol as needed. Since our last visit he underwent a modified barium swallow and I personally reviewed the results today. He had minimal to mild pharyngeal dysphagia and a delayed swallow initiation without any obvious laryngeal penetration or aspiration. He was started on swallowing and reflux precautions and a normal diet. His cough is a bit better but still happens w meals. He has been reliable with his Spiriva.  Needs refill on albuterol and nasal spray rec  No change rx/ asp precautions   08/05/2015 acute extended ov/Wert re: severe cough flare Chief Complaint  Patient presents with  . Acute Visit    Pt c/o increased cough for the past wk- cough is prod with white sputum. Cough is worse after he eats and also at night. He sometimes coughs until he vomits.     Chronic cough Bad to worse cough 07/30/15 esp p supper and hs / maint rx  with spiriva but has very little evidence of copd by previous pfts  Already started on pred s benefit, has not started augmentin called in.  No obvious day to day or daytime variability or assoc limiting sob  or cp or chest tightness, subjective wheeze or overt sinus or hb symptoms. No unusual exp hx or h/o childhood pna/ asthma or knowledge of premature birth.  Sleeping ok without nocturnal  or early am exacerbation  of respiratory  c/o's or need for noct saba. Also denies any obvious fluctuation of symptoms with  weather or environmental changes or other aggravating or alleviating factors except as outlined above   Current Medications, Allergies, Complete Past Medical History, Past Surgical History, Family History, and Social History were reviewed in [employer] record.  ROS  The following are not active complaints unless bolded sore throat, dysphagia, dental problems, itching, sneezing,  nasal congestion or excess/ purulent secretions, ear ache,   fever, chills, sweats, unintended wt loss, classically pleuritic or exertional cp, hemoptysis,  orthopnea pnd or leg swelling, presyncope, palpitations, abdominal pain, anorexia, nausea, vomiting, diarrhea  or change in bowel or bladder habits, change in stools or urine, dysuria,hematuria,  rash, arthralgias, visual complaints, headache, numbness, weakness or ataxia or problems with walking or coordination,  change in mood/affect or memory.           Objective:   Physical Exam     Wt Readings from Last 3 Encounters:  08/05/15 174 lb (78.926 kg)  06/10/15 181 lb (82.101 kg)  05/03/15 180 lb (81.647 kg)    Vital signs reviewed    GEN: A/Ox3; pleasant , NAD, elderly very hoarse/raspy/ upper airway pattern cough   HEENT:  [place]/AT,   NOSE-clear, THROAT-clear, no lesions, no postnasal drip or exudate noted.  Dentition poor   RESP  Clear bilaterally to A and P with good air movement   CARD:  RRR, no m/r/g  , no peripheral edema, pulses intact, no cyanosis or clubbing.  Musco: Warm bil, no deformities or joint swelling noted.   Neuro: alert, no focal deficits noted.    Skin: Warm, no lesions or rashes   CXR PA and Lateral:   08/05/2015 :     I personally reviewed images and agree with radiology impression as follows: Elevation of the left hemidiaphragm with scarring in the left base region, stable. No edema or consolidation. No new opacity. No change in cardiac silhouette.        Assessment & Plan:

## 2015-08-05 NOTE — Patient Instructions (Addendum)
Pantoprazole 40 Take 30- 60 min before your first and last meals of the day and stop the fish oil (eat more fish!)  Stop spiriva as you have little evidence of copd and it may be making your cough  GERD (REFLUX)  is an extremely common cause of respiratory symptoms just like yours , many times with no obvious heartburn at all.    It can be treated with medication, but also with lifestyle changes including elevation of the head of your bed (ideally with 6 inch  bed blocks),  Smoking cessation, avoidance of late meals, excessive alcohol, and avoid fatty foods, chocolate, peppermint, colas, red wine, and acidic juices such as orange juice.  NO MINT OR MENTHOL PRODUCTS SO NO COUGH DROPS  USE SUGARLESS CANDY INSTEAD (Jolley ranchers or Stover's or Life Savers) or even ice chips will also do - the key is to swallow to prevent all throat clearing. NO OIL BASED VITAMINS - use powdered substitutes.    Finish the prednisone but hold augmentin for now  For drainage take chlortrimeton (chlorpheniramine) 4 mg every 4 hours available over the counter (may cause drowsiness)   Take delsym two tsp every 12 hours and supplement if needed with  tramadol 50 mg up to 1 every 4 hours to suppress the urge to cough. Swallowing water or using ice chips/non mint and menthol containing candies (such as lifesavers or sugarless jolly ranchers) are also effective.  You should rest your voice and avoid activities that you know make you cough.  Once you have eliminated the cough for 3 straight days try reducing the tramadol first,  then the delsym as tolerated.    Please schedule a follow up office visit in 2 weeks, sooner if needed to see Tammy NP

## 2015-08-05 NOTE — Progress Notes (Signed)
Quick Note:  Spoke with pt and notified of results per Dr. Wert. Pt verbalized understanding and denied any questions.  ______ 

## 2015-08-06 ENCOUNTER — Encounter: Payer: Self-pay | Admitting: Internal Medicine

## 2015-08-06 DIAGNOSIS — E66812 Obesity, class 2: Secondary | ICD-10-CM | POA: Insufficient documentation

## 2015-08-06 DIAGNOSIS — Z6835 Body mass index (BMI) 35.0-35.9, adult: Secondary | ICD-10-CM

## 2015-08-06 NOTE — Assessment & Plan Note (Signed)
Body mass index is 30.83 kg/(m^2).  Lab Results  Component Value Date   TSH 4.208 03/03/2015     Contributing to gerd tendency/ doe/reviewed need  achieve and maintain neg calorie balance > defer f/u primary care including intermittently monitoring thyroid status

## 2015-08-06 NOTE — Assessment & Plan Note (Addendum)
-   Esophogram 05/16/15 > Essentially normal exam. Trace residual within the piriform sinus.  Reviewed max rx for gerd > clearly he's refluxing as he coughs so hard he vomits and had mild gastroparesis by NM study 2009

## 2015-08-06 NOTE — Assessment & Plan Note (Addendum)
PFT's  06/09/14  FEV1 1.47 (62 % ) ratio 85  p no % improvement from saba with DLCO  67 % corrects to 84  % for alv volume  - Esophogram 05/16/15 > Essentially normal exam. Trace residual within the piriform sinus.  The most common causes of chronic cough in immunocompetent adults include the following: upper airway cough syndrome (UACS), previously referred to as postnasal drip syndrome (PNDS), which is caused by variety of rhinosinus conditions; (2) asthma; (3) GERD; (4) chronic bronchitis from cigarette smoking or other inhaled environmental irritants; (5) nonasthmatic eosinophilic bronchitis; and (6) bronchiectasis.   These conditions, singly or in combination, have accounted for up to 94% of the causes of chronic cough in prospective studies.   Other conditions have constituted no >6% of the causes in prospective studies These have included bronchogenic carcinoma, chronic interstitial pneumonia, sarcoidosis, left ventricular failure, ACEI-induced cough, and aspiration from a condition associated with pharyngeal dysfunction.    Chronic cough is often simultaneously caused by more than one condition. A single cause has been found from 38 to 82% of the time, multiple causes from 18 to 62%. Multiply caused cough has been the result of three diseases up to 42% of the time.       Based on hx and exam, this is most likely:  Classic Upper airway cough syndrome, so named because it's frequently impossible to sort out how much is  CR/sinusitis with freq throat clearing (which can be related to primary GERD)   vs  causing  secondary (" extra esophageal")  GERD from wide swings in gastric pressure that occur with throat clearing, often  promoting self use of mint and menthol lozenges that reduce the lower esophageal sphincter tone and exacerbate the problem further in a cyclical fashion.   These are the same pts (now being labeled as having "irritable larynx syndrome" by some cough centers) who not  infrequently have a history of having failed to tolerate ace inhibitors,  dry powder inhalers like spiriva  or biphosphonates or report having atypical reflux symptoms that don't respond to standard doses of PPI , and are easily confused as having aecopd or asthma flares by even experienced allergists/ pulmonologists.   The first step is to maximize acid suppression and eliminate cyclical coughing then regroup if the cough persists.  I had an extended discussion with the patient reviewing all relevant studies completed to date and  lasting 69 m of 40 min acute ov  1) Explained: The standardized cough guidelines published in Chest by Lissa Morales in 2006 are still the best available and consist of a multiple step process (up to 12!) , not a single office visit,  and are intended  to address this problem logically,  with an alogrithm dependent on response to empiric treatment at  each progressive step  to determine a specific diagnosis with  minimal addtional testing needed. Therefore if adherence is an issue or can't be accurately verified,  it's very unlikely the standard evaluation and treatment will be successful here.    Furthermore, response to therapy (other than acute cough suppression, which should only be used short term with avoidance of narcotic containing cough syrups if possible), can be a gradual process for which the patient may perceive immediate benefit.  Unlike going to an eye doctor where the best perscription is almost always the first one and is immediately effective, this is almost never the case in the management of chronic cough syndromes. Therefore the patient needs  to commit up front to consistently adhere to recommendations  for up to 6 weeks of therapy directed at the likely underlying problem(s) before the response can be reasonably evaluated.     2) Each maintenance medication was reviewed in detail including most importantly the difference between maintenance and prns and  under what circumstances the prns are to be triggered using an action plan format that is not reflected in the computer generated alphabetically organized AVS.    Please see instructions for details which were reviewed in writing and the patient given a copy highlighting the part that I personally wrote and discussed at today's ov.   See instructions for specific recommendations which were reviewed directly with the patient who was given a copy with highlighter outlining the key components.

## 2015-08-19 ENCOUNTER — Ambulatory Visit (INDEPENDENT_AMBULATORY_CARE_PROVIDER_SITE_OTHER): Payer: Medicare Other | Admitting: Adult Health

## 2015-08-19 ENCOUNTER — Encounter: Payer: Self-pay | Admitting: Adult Health

## 2015-08-19 VITALS — BP 112/84 | HR 92 | Temp 98.2°F | Ht <= 58 in | Wt 181.0 lb

## 2015-08-19 DIAGNOSIS — Z23 Encounter for immunization: Secondary | ICD-10-CM

## 2015-08-19 DIAGNOSIS — R05 Cough: Secondary | ICD-10-CM | POA: Diagnosis not present

## 2015-08-19 DIAGNOSIS — R053 Chronic cough: Secondary | ICD-10-CM

## 2015-08-19 DIAGNOSIS — J449 Chronic obstructive pulmonary disease, unspecified: Secondary | ICD-10-CM | POA: Diagnosis not present

## 2015-08-19 DIAGNOSIS — I2510 Atherosclerotic heart disease of native coronary artery without angina pectoris: Secondary | ICD-10-CM

## 2015-08-19 NOTE — Assessment & Plan Note (Signed)
Recent flare now imrpoving  Control for triggers  Plan  Continue on Claritin 10mg  daily in am .  Add Chlortrimeton 4mg  2 At bedtime  For drainage .  Saline nasal spray As needed   May use Delsym 2 tsp Twice daily  For cough .  Need to see dentist .  Follow up Dr. Lamonte Sakai  In 2 months and As needed

## 2015-08-19 NOTE — Assessment & Plan Note (Signed)
Combination of restriction/obstructive disease -recent flare improved with steroids  Off Spiriva right now. ? Aggravating cough  Will need to decide if worse off or if need additional inhaler if recurrent flare   Plan Continue on Claritin 10mg  daily in am .  Add Chlortrimeton 4mg  2 At bedtime  For drainage .  Saline nasal spray As needed   May use Delsym 2 tsp Twice daily  For cough .  Need to see dentist .  Follow up Dr. Lamonte Sakai  In 2 months and As needed

## 2015-08-19 NOTE — Progress Notes (Signed)
Subjective:    Patient ID: JAMARII BANKS, male    DOB: 1951/02/13, 64 y.o.   MRN: [id number]  Cough   64 yo man, former smoker (30 pk-yrs), history of CAD s/p PTCI, GERD, hypertension, hypothyroidism, chronic kidney disease, prior traumatic pneumothorax. He is referred today for chronic cough for past 6-7 months. Has been productive in the past, white; not productive now. Seems to be worst in the am. He is having a lot of sinus drainage, clear mucous. He was placed on QVAR and nasal steroid recently > seems to have helped his breatihng and possibly his cough. He has breakthrough GERD sx. He has benfited from albuterol prn.   ROV 06/09/14 -- follows up for dyspnea and cough. Last time we added loratadine, increased fluticasone and pantoprazole. We also empirically changed his QVAR to spiriva. PFT today  > mixed disease w no BD response, FEV1 63%, decreased DLCO that corrects for Va. His cough may be a bit less but still has it sometimes. The spiriva seems to help him, more relief than QVAR.   06/2014 Acute OV -Hx of COPD  Complains of PND, prod cough with white mucus, some wheezing and increased SOB, tightness in chest x 4 days.  Denies f/c/s, n/v/d, hemoptysis, Not taking any otc meds.  Remains on Spiriva. Has been coughing so hard at times. Cough is keeping him up at night.  >augmentin rx   Acute OV : COPD 03/22/15 --  Patient presents for an acute office visit Patient complains of 1 week of cough, congestion, drainage, with thick white mucus.  Has severe cough to point of vomitting mucus.  Taking claritin and mucinex without any help.  He remains on Spiriva daily. Previously on QVAR without perceived benefit.  He denies any chest pain, fever, orthopnea, PND, leg swelling, hemoptysis or unintentional weight loss. Last chest x-ray October 2015 with no acute changes.  ROV 05/03/15 -- follows for mixed obstruction and restriction, cough. Former tobacco.  History is taken from the patient  and his son. He is here for regular f/u, having exertional SOB, especially with walking uphill. Can also be associated with chest "soreness". He has some cough that happens w eating.  Has had more cough w white phlegm He is on Spiriva. He has ventolin available, uses it most days. He is taking loratadine and fluticasone. Was treated with pred and doxy in 4/'16 by TP. Having some dysphagia, tickle in throat, occasionally getting stuck.   ROV 06/10/15 -- follow-up visit for mixed obstruction and restriction, chronic cough. He has been maintained on Spiriva and albuterol as needed. Since our last visit he underwent a modified barium swallow and I personally reviewed the results today. He had minimal to mild pharyngeal dysphagia and a delayed swallow initiation without any obvious laryngeal penetration or aspiration. He was started on swallowing and reflux precautions and a normal diet. His cough is a bit better but still happens w meals. He has been reliable with his Spiriva.  Needs refill on albuterol and nasal spray rec  No change rx/ asp precautions   08/05/2015 acute extended ov/Wert re: severe cough flare Chief Complaint  Patient presents with  . Acute Visit    Pt c/o increased cough for the past wk- cough is prod with white sputum. Cough is worse after he eats and also at night. He sometimes coughs until he vomits.     Chronic cough Bad to worse cough 07/30/15 esp p supper and hs / maint rx  with spiriva but has very little evidence of copd by previous pfts  Already started on pred s benefit, has not started augmentin called in. >pred taper , hold spiriva  08/19/2015 Follow up : Chronic cough /COPD (mixed obstruction/restriction)  Pt presents for 2 week follow up . Seen last ov with cough flare . Given prednisone taper.  He is feeling better but still has a lot of drainage in throat that aggravates his cough.  His spiriva was held , he says he feels better off of it.  Denies fever, chest pain,  orthopnea, or edema. No discolored mucus .  Wants flu shot today .   Current Medications, Allergies, Complete Past Medical History, Past Surgical History, Family History, and Social History were reviewed in [employer] record.  ROS  The following are not active complaints unless bolded sore throat, d , itching, sneezing,  nasal congestion or excess/ purulent secretions, ear ache,   fever, chills, sweats, unintended wt loss, classically pleuritic or exertional cp, hemoptysis,  orthopnea pnd or leg swelling, presyncope, palpitations, abdominal pain, anorexia, nausea,   diarrhea  or change in bowel or bladder habits, change in stools or urine, dysuria,hematuria,  rash, arthralgias, visual complaints, headache, numbness, weakness or ataxia or problems with walking or coordination,  change in mood/affect or memory.           Objective:   Physical Exam     Vital signs reviewed    GEN: A/Ox3; pleasant , NAD, elderly ,   HEENT:  [place]/AT,   NOSE-clear, THROAT-clear, no lesions, no postnasal drip or exudate noted.  Dentition poor   RESP  Clear bilaterally to A and P with good air movement   CARD:  RRR, no m/r/g  , no peripheral edema, pulses intact, no cyanosis or clubbing.  Musco: Warm bil, no deformities or joint swelling noted.   Neuro: alert, no focal deficits noted.    Skin: Warm, no lesions or rashes   CXR PA and Lateral:   08/05/2015 :     Elevation of the left hemidiaphragm with scarring in the left base region, stable. No edema or consolidation. No new opacity. No change in cardiac silhouette.        Assessment & Plan:

## 2015-08-19 NOTE — Patient Instructions (Addendum)
Continue on Claritin 10mg  daily in am .  Add Chlortrimeton 4mg  2 At bedtime  For drainage .  Saline nasal spray As needed   May use Delsym 2 tsp Twice daily  For cough .  Need to see dentist .  Follow up Dr. Lamonte Sakai  In 2 months and As needed   Please contact office for sooner follow up if symptoms do not improve or worsen or seek emergency care  Flu shot today

## 2015-08-26 ENCOUNTER — Other Ambulatory Visit: Payer: Self-pay | Admitting: Internal Medicine

## 2015-09-08 ENCOUNTER — Other Ambulatory Visit: Payer: Self-pay | Admitting: Internal Medicine

## 2015-09-14 ENCOUNTER — Telehealth: Payer: Self-pay

## 2015-09-14 ENCOUNTER — Telehealth: Payer: Self-pay | Admitting: Internal Medicine

## 2015-09-14 DIAGNOSIS — I10 Essential (primary) hypertension: Secondary | ICD-10-CM

## 2015-09-14 DIAGNOSIS — I2510 Atherosclerotic heart disease of native coronary artery without angina pectoris: Secondary | ICD-10-CM

## 2015-09-14 MED ORDER — CLOPIDOGREL BISULFATE 75 MG PO TABS: ORAL_TABLET | ORAL | Status: DC

## 2015-09-14 MED ORDER — CARVEDILOL 6.25 MG PO TABS
6.2500 mg | ORAL_TABLET | Freq: Two times a day (BID) | ORAL | Status: DC
Start: 2015-09-14 — End: 2015-10-18

## 2015-09-14 NOTE — Telephone Encounter (Signed)
Returned patient phone call Patient not available Left message on voice mail to return our call One month supply sent on requested refills Future refills will need an office visit

## 2015-09-14 NOTE — Telephone Encounter (Signed)
Patient called requesting a medication refill for clopidogrel (PLAVIX) and carvedilol (COREG)  Please follow up with patient

## 2015-10-14 ENCOUNTER — Other Ambulatory Visit: Payer: Self-pay | Admitting: Internal Medicine

## 2015-10-18 ENCOUNTER — Ambulatory Visit: Payer: Medicare Other | Attending: Internal Medicine | Admitting: Internal Medicine

## 2015-10-18 ENCOUNTER — Encounter: Payer: Self-pay | Admitting: Internal Medicine

## 2015-10-18 ENCOUNTER — Other Ambulatory Visit: Payer: Self-pay

## 2015-10-18 VITALS — BP 128/79 | HR 89 | Temp 98.0°F | Resp 16 | Ht 64.0 in | Wt 188.0 lb

## 2015-10-18 DIAGNOSIS — E079 Disorder of thyroid, unspecified: Secondary | ICD-10-CM | POA: Diagnosis not present

## 2015-10-18 DIAGNOSIS — N183 Chronic kidney disease, stage 3 (moderate): Secondary | ICD-10-CM | POA: Insufficient documentation

## 2015-10-18 DIAGNOSIS — K219 Gastro-esophageal reflux disease without esophagitis: Secondary | ICD-10-CM

## 2015-10-18 DIAGNOSIS — Z79899 Other long term (current) drug therapy: Secondary | ICD-10-CM | POA: Insufficient documentation

## 2015-10-18 DIAGNOSIS — I10 Essential (primary) hypertension: Secondary | ICD-10-CM

## 2015-10-18 DIAGNOSIS — Z87891 Personal history of nicotine dependence: Secondary | ICD-10-CM | POA: Insufficient documentation

## 2015-10-18 DIAGNOSIS — E039 Hypothyroidism, unspecified: Secondary | ICD-10-CM | POA: Diagnosis not present

## 2015-10-18 DIAGNOSIS — I129 Hypertensive chronic kidney disease with stage 1 through stage 4 chronic kidney disease, or unspecified chronic kidney disease: Secondary | ICD-10-CM | POA: Insufficient documentation

## 2015-10-18 DIAGNOSIS — I2510 Atherosclerotic heart disease of native coronary artery without angina pectoris: Secondary | ICD-10-CM

## 2015-10-18 DIAGNOSIS — Z7951 Long term (current) use of inhaled steroids: Secondary | ICD-10-CM | POA: Diagnosis not present

## 2015-10-18 DIAGNOSIS — Z7902 Long term (current) use of antithrombotics/antiplatelets: Secondary | ICD-10-CM | POA: Insufficient documentation

## 2015-10-18 DIAGNOSIS — Z7982 Long term (current) use of aspirin: Secondary | ICD-10-CM | POA: Diagnosis not present

## 2015-10-18 LAB — BASIC METABOLIC PANEL
BUN: 14 mg/dL (ref 7–25)
CO2: 28 mmol/L (ref 20–31)
Calcium: 9.5 mg/dL (ref 8.6–10.3)
Chloride: 103 mmol/L (ref 98–110)
Creat: 1.2 mg/dL (ref 0.70–1.25)
Glucose, Bld: 85 mg/dL (ref 65–99)
POTASSIUM: 4.5 mmol/L (ref 3.5–5.3)
SODIUM: 140 mmol/L (ref 135–146)

## 2015-10-18 MED ORDER — PANTOPRAZOLE SODIUM 40 MG PO TBEC: DELAYED_RELEASE_TABLET | ORAL | Status: DC

## 2015-10-18 MED ORDER — CARVEDILOL 6.25 MG PO TABS: 6.2500 mg | ORAL_TABLET | Freq: Two times a day (BID) | ORAL | Status: DC

## 2015-10-18 MED ORDER — CLOPIDOGREL BISULFATE 75 MG PO TABS: ORAL_TABLET | ORAL | Status: DC

## 2015-10-18 MED ORDER — ATORVASTATIN CALCIUM 40 MG PO TABS: 40.0000 mg | ORAL_TABLET | Freq: Every day | ORAL | Status: DC
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
## 2015-10-18 NOTE — Patient Instructions (Signed)
[facility]: Ramond Dial MD ? Internist Address: [address] [place], [place], [place] [address] Phone: [phone]

## 2015-10-18 NOTE — Progress Notes (Signed)
Patient here for follow up on his HTN and gerd Patient states he got a call and told to come see his doctor

## 2015-10-18 NOTE — Telephone Encounter (Signed)
Nurse called patient, patient verified date of birth. Nurse received refill request for plavix and carvedilol. Nurse will send refill for Plavix, with 2 refills.  Nurse will send refill for 1 month for carvedilol due to patient needing appointment to be seen for follow up HTN. Patient agrees to schedule appointment to be seen.  Nurse transferred patient to front office staff to schedule appointment to be seen.

## 2015-10-19 LAB — TSH: TSH: 16.327 u[IU]/mL — ABNORMAL HIGH (ref 0.350–4.500)

## 2015-10-25 ENCOUNTER — Ambulatory Visit (INDEPENDENT_AMBULATORY_CARE_PROVIDER_SITE_OTHER): Payer: Medicare Other | Admitting: Emergency Medicine

## 2015-10-25 ENCOUNTER — Encounter: Payer: Self-pay | Admitting: Emergency Medicine

## 2015-10-25 ENCOUNTER — Telehealth: Payer: Self-pay

## 2015-10-25 VITALS — BP 120/84 | HR 75 | Wt 185.0 lb

## 2015-10-25 DIAGNOSIS — K219 Gastro-esophageal reflux disease without esophagitis: Secondary | ICD-10-CM

## 2015-10-25 DIAGNOSIS — I2510 Atherosclerotic heart disease of native coronary artery without angina pectoris: Secondary | ICD-10-CM | POA: Diagnosis not present

## 2015-10-25 DIAGNOSIS — J449 Chronic obstructive pulmonary disease, unspecified: Secondary | ICD-10-CM

## 2015-10-25 DIAGNOSIS — R05 Cough: Secondary | ICD-10-CM | POA: Diagnosis not present

## 2015-10-25 DIAGNOSIS — E079 Disorder of thyroid, unspecified: Secondary | ICD-10-CM

## 2015-10-25 DIAGNOSIS — R0982 Postnasal drip: Secondary | ICD-10-CM | POA: Diagnosis not present

## 2015-10-25 DIAGNOSIS — R053 Chronic cough: Secondary | ICD-10-CM

## 2015-10-25 MED ORDER — TIOTROPIUM BROMIDE MONOHYDRATE 1.25 MCG/ACT IN AERS: 2.0000 | INHALATION_SPRAY | Freq: Every day | RESPIRATORY_TRACT | Status: DC

## 2015-10-25 NOTE — Patient Instructions (Signed)
We will try starting a different delivery system for Spiriva to see if this helps her breathing and is easier on your cough Please continue fluticasone nasal spray and loratadine every day Use albuterol as needed for shortness of breath Continue pantoprazole 40 mg daily Follow with Dr Lamonte Sakai in 2 months To assess your status on the new medicine or sooner if you have any problems.

## 2015-10-25 NOTE — Telephone Encounter (Signed)
Patient not available  Message left with family member for him to return our call

## 2015-10-25 NOTE — Progress Notes (Signed)
Subjective:    Patient ID: George Robbins, male    DOB: 08/12/1951, 64 y.o.   MRN: [id number]  Cough Associated symptoms include postnasal drip, rhinorrhea and shortness of breath.   64 yo man, former smoker (30 pk-yrs), history of CAD s/p PTCI, GERD, hypertension, hypothyroidism, chronic kidney disease, prior traumatic pneumothorax. He is referred today for chronic cough for past 6-7 months. Has been productive in the past, white; not productive now. Seems to be worst in the am. He is having a lot of sinus drainage, clear mucous. He was placed on QVAR and nasal steroid recently > seems to have helped his breatihng and possibly his cough. He has breakthrough GERD sx. He has benfited from albuterol prn.   ROV 06/09/14 -- follows up for dyspnea and cough. Last time we added loratadine, increased fluticasone and pantoprazole. We also empirically changed his QVAR to spiriva. PFT today  > mixed disease w no BD response, FEV1 63%, decreased DLCO that corrects for Va. His cough may be a bit less but still has it sometimes. The spiriva seems to help him, more relief than QVAR.   06/2014 Acute OV -Hx of COPD  Complains of PND, prod cough with white mucus, some wheezing and increased SOB, tightness in chest x 4 days.  Denies f/c/s, n/v/d, hemoptysis, Not taking any otc meds.  Remains on Spiriva. Has been coughing so hard at times. Cough is keeping him up at night.  >augmentin rx   Acute OV : COPD 03/22/15 --  Patient presents for an acute office visit Patient complains of 1 week of cough, congestion, drainage, with thick white mucus.  Has severe cough to point of vomitting mucus.  Taking claritin and mucinex without any help.  He remains on Spiriva daily. Previously on QVAR without perceived benefit.  He denies any chest pain, fever, orthopnea, PND, leg swelling, hemoptysis or unintentional weight loss. Last chest x-ray October 2015 with no acute changes.  ROV 05/03/15 -- follows for mixed  obstruction and restriction, cough. Former tobacco.  History is taken from the patient and his son. He is here for regular f/u, having exertional SOB, especially with walking uphill. Can also be associated with chest "soreness". He has some cough that happens w eating.  Has had more cough w white phlegm He is on Spiriva. He has ventolin available, uses it most days. He is taking loratadine and fluticasone. Was treated with pred and doxy in 4/'16 by TP. Having some dysphagia, tickle in throat, occasionally getting stuck.   ROV 06/10/15 -- follow-up visit for mixed obstruction and restriction, chronic cough. He has been maintained on Spiriva and albuterol as needed. Since our last visit he underwent a modified barium swallow and I personally reviewed the results today. He had minimal to mild pharyngeal dysphagia and a delayed swallow initiation without any obvious laryngeal penetration or aspiration. He was started on swallowing and reflux precautions and a normal diet. His cough is a bit better but still happens w meals. He has been reliable with his Spiriva.  Needs refill on albuterol and nasal spray  ROV 10/25/15 -- follow-up visit for chronic cough, history of traumatic pneumothorax, GERD, CAD. He has mixed disease on spirometry from 06/09/14 that I personally reviewed today. There has been some question of delayed swallow in the past but no obvious aspiration on barium swallow. He has had flaring of his chronic cough in September and was seen by Dr Melvyn Novas and TP.  A trial off his  Spiriva was performed at that time. He is having some increased SOB, especially up hills. He states that he is taking his loratadine, fluticasone nasal spray. He is on pantoprazole once a day.  He uses albuterol many mornings. His cough is certainly less than last visit.   No flowsheet data found.     Review of Systems  HENT: Positive for postnasal drip and rhinorrhea.   Respiratory: Positive for cough and shortness of breath.          Objective:   Physical Exam  Filed Vitals:   10/25/15 0919  BP: 120/84  Pulse: 75  Weight: 185 lb (83.915 kg)  SpO2: 92%   Gen: Pleasant, well-nourished, in no distress,  normal affect  ENT: No lesions,  mouth clear,  oropharynx clear, no postnasal drip  Neck: No JVD, no TMG, no carotid bruits  Lungs: No use of accessory muscles, clear without rales or rhonchi  Cardiovascular: RRR, heart sounds normal, no murmur or gallops, no peripheral edema  Musculoskeletal: No deformities, no cyanosis or clubbing  Neuro: alert, non focal  Skin: Warm, no lesions or rashes       Assessment & Plan:  COPD (chronic obstructive pulmonary disease) We will try starting a different delivery system for Spiriva to see if this helps her breathing and is easier on your cough   Chronic cough Continue current therapy for allergic rhinitis- fluticasone and loratadine Continue pantoprazole Trial of Spiriva Respimat to see if better tolerated regarding his cough  Post-nasal drip Allergy regimen as above  GERD (gastroesophageal reflux disease) Pantoprazole 40 daily

## 2015-10-25 NOTE — Assessment & Plan Note (Signed)
Allergy regimen as above

## 2015-10-25 NOTE — Assessment & Plan Note (Signed)
Continue current therapy for allergic rhinitis- fluticasone and loratadine Continue pantoprazole Trial of Spiriva Respimat to see if better tolerated regarding his cough

## 2015-10-25 NOTE — Assessment & Plan Note (Signed)
We will try starting a different delivery system for Spiriva to see if this helps her breathing and is easier on your cough

## 2015-10-25 NOTE — Assessment & Plan Note (Signed)
Pantoprazole 40 daily 

## 2015-10-25 NOTE — Telephone Encounter (Signed)
-----   Message from Lance Bosch, NP sent at 10/19/2015  9:27 AM EST ----- Patient's TSH has really went up. Make sure he is taking the Synthroid early in the morning and not taking another medicine fore at least 1 hour. He needs to wait 2 hours after Synthroid to take Protonix. I want him to make these changes and then we will recheck TSH in 6-8 weeks to see if that made a difference. Please place future order and have him to make a lab appointment

## 2015-10-25 NOTE — Telephone Encounter (Signed)
Patient returned phone call please f/u at [phone]

## 2015-10-26 NOTE — Telephone Encounter (Signed)
Patient is calling to obtain his results. Please follow up with pt. Thank you.

## 2015-10-31 ENCOUNTER — Other Ambulatory Visit: Payer: Self-pay | Admitting: Internal Medicine

## 2015-10-31 ENCOUNTER — Telehealth: Payer: Self-pay

## 2015-10-31 NOTE — Telephone Encounter (Signed)
Returned patient phone call Patient is aware of his thyroid test results Patient also understands he has to wait in between taking his medications Call transferred to front desk to schedule follow up lab test

## 2015-12-06 ENCOUNTER — Emergency Department (HOSPITAL_COMMUNITY): Payer: Medicare Other

## 2015-12-06 ENCOUNTER — Encounter (HOSPITAL_COMMUNITY): Payer: Self-pay | Admitting: Family Medicine

## 2015-12-06 ENCOUNTER — Observation Stay (HOSPITAL_COMMUNITY)
Admission: EM | Admit: 2015-12-06 | Discharge: 2015-12-08 | Disposition: A | Discharge status: Home / Self Care | Payer: Medicare Other | Attending: Internal Medicine | Admitting: Internal Medicine

## 2015-12-06 DIAGNOSIS — N183 Chronic kidney disease, stage 3 (moderate): Secondary | ICD-10-CM | POA: Diagnosis not present

## 2015-12-06 DIAGNOSIS — J449 Chronic obstructive pulmonary disease, unspecified: Secondary | ICD-10-CM | POA: Diagnosis not present

## 2015-12-06 DIAGNOSIS — Z7951 Long term (current) use of inhaled steroids: Secondary | ICD-10-CM | POA: Diagnosis not present

## 2015-12-06 DIAGNOSIS — Z955 Presence of coronary angioplasty implant and graft: Secondary | ICD-10-CM | POA: Insufficient documentation

## 2015-12-06 DIAGNOSIS — R079 Chest pain, unspecified: Principal | ICD-10-CM | POA: Insufficient documentation

## 2015-12-06 DIAGNOSIS — Z87891 Personal history of nicotine dependence: Secondary | ICD-10-CM | POA: Diagnosis not present

## 2015-12-06 DIAGNOSIS — D72829 Elevated white blood cell count, unspecified: Secondary | ICD-10-CM | POA: Insufficient documentation

## 2015-12-06 DIAGNOSIS — Z79899 Other long term (current) drug therapy: Secondary | ICD-10-CM | POA: Diagnosis not present

## 2015-12-06 DIAGNOSIS — R0789 Other chest pain: Secondary | ICD-10-CM | POA: Diagnosis not present

## 2015-12-06 DIAGNOSIS — E039 Hypothyroidism, unspecified: Secondary | ICD-10-CM | POA: Insufficient documentation

## 2015-12-06 DIAGNOSIS — Z7902 Long term (current) use of antithrombotics/antiplatelets: Secondary | ICD-10-CM | POA: Diagnosis not present

## 2015-12-06 DIAGNOSIS — N182 Chronic kidney disease, stage 2 (mild): Secondary | ICD-10-CM | POA: Diagnosis present

## 2015-12-06 DIAGNOSIS — I2510 Atherosclerotic heart disease of native coronary artery without angina pectoris: Secondary | ICD-10-CM | POA: Insufficient documentation

## 2015-12-06 DIAGNOSIS — K219 Gastro-esophageal reflux disease without esophagitis: Secondary | ICD-10-CM | POA: Insufficient documentation

## 2015-12-06 DIAGNOSIS — Z7982 Long term (current) use of aspirin: Secondary | ICD-10-CM | POA: Diagnosis not present

## 2015-12-06 DIAGNOSIS — I25119 Atherosclerotic heart disease of native coronary artery with unspecified angina pectoris: Secondary | ICD-10-CM | POA: Diagnosis present

## 2015-12-06 DIAGNOSIS — I129 Hypertensive chronic kidney disease with stage 1 through stage 4 chronic kidney disease, or unspecified chronic kidney disease: Secondary | ICD-10-CM | POA: Insufficient documentation

## 2015-12-06 DIAGNOSIS — I10 Essential (primary) hypertension: Secondary | ICD-10-CM | POA: Diagnosis present

## 2015-12-06 DIAGNOSIS — R0602 Shortness of breath: Secondary | ICD-10-CM | POA: Diagnosis not present

## 2015-12-06 DIAGNOSIS — R069 Unspecified abnormalities of breathing: Secondary | ICD-10-CM | POA: Diagnosis not present

## 2015-12-06 LAB — COMPREHENSIVE METABOLIC PANEL
ALT: 26 U/L (ref 17–63)
ANION GAP: 11 (ref 5–15)
AST: 29 U/L (ref 15–41)
Albumin: 4.1 g/dL (ref 3.5–5.0)
Alkaline Phosphatase: 88 U/L (ref 38–126)
BUN: 16 mg/dL (ref 6–20)
CHLORIDE: 105 mmol/L (ref 101–111)
CO2: 26 mmol/L (ref 22–32)
CREATININE: 1.29 mg/dL — AB (ref 0.61–1.24)
Calcium: 9.5 mg/dL (ref 8.9–10.3)
GFR calc non Af Amer: 57 mL/min — ABNORMAL LOW (ref >60)
Glucose, Bld: 106 mg/dL — ABNORMAL HIGH (ref 65–99)
POTASSIUM: 3.9 mmol/L (ref 3.5–5.1)
Sodium: 142 mmol/L (ref 135–145)
Total Bilirubin: 0.4 mg/dL (ref 0.3–1.2)
Total Protein: 7.2 g/dL (ref 6.5–8.1)

## 2015-12-06 LAB — CBC
HCT: 42.1 % (ref 39.0–52.0)
Hemoglobin: 14 g/dL (ref 13.0–17.0)
MCH: 29.5 pg (ref 26.0–34.0)
MCHC: 33.3 g/dL (ref 30.0–36.0)
MCV: 88.6 fL (ref 78.0–100.0)
PLATELETS: 188 10*3/uL (ref 150–400)
RBC: 4.75 MIL/uL (ref 4.22–5.81)
RDW: 14.7 % (ref 11.5–15.5)
WBC: 12.6 10*3/uL — AB (ref 4.0–10.5)

## 2015-12-06 LAB — I-STAT TROPONIN, ED: TROPONIN I, POC: 0 ng/mL (ref 0.00–0.08)

## 2015-12-06 LAB — APTT: APTT: 28 s (ref 24–37)

## 2015-12-06 LAB — PROTIME-INR
INR: 1.11 (ref 0.00–1.49)
PROTHROMBIN TIME: 14.5 s (ref 11.6–15.2)

## 2015-12-06 MED ORDER — SODIUM CHLORIDE 0.9 % IV SOLN
1000.0000 mL | INTRAVENOUS | Status: DC
  Administered 2015-12-06: 1000 mL via INTRAVENOUS

## 2015-12-06 MED ORDER — NITROGLYCERIN 2 % TD OINT
1.0000 [in_us] | TOPICAL_OINTMENT | Freq: Once | TRANSDERMAL | Status: AC
  Administered 2015-12-06: 1 [in_us] via TOPICAL
  Filled 2015-12-06: qty 10

## 2015-12-06 NOTE — ED Notes (Signed)
Patient is from home and transported via [facility]. Pt took his evening Lipitor medication, started having sudden onset of shortness of breath with stinging chest pain. When EMS arrived, patient was in no acute distress. O2 sat was 98% on room air. Pt was in a NSR. IV was established and ZOFRAN 4mg  IV given. Pt was ambulatory to stretcher.

## 2015-12-06 NOTE — ED Notes (Signed)
Bed: [id number] Expected date:  Expected time:  Means of arrival:  Comments: 65 yr old respiratory distress
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
## 2015-12-07 DIAGNOSIS — J438 Other emphysema: Secondary | ICD-10-CM

## 2015-12-07 DIAGNOSIS — N182 Chronic kidney disease, stage 2 (mild): Secondary | ICD-10-CM | POA: Diagnosis not present

## 2015-12-07 DIAGNOSIS — I25119 Atherosclerotic heart disease of native coronary artery with unspecified angina pectoris: Secondary | ICD-10-CM

## 2015-12-07 DIAGNOSIS — I25110 Atherosclerotic heart disease of native coronary artery with unstable angina pectoris: Secondary | ICD-10-CM

## 2015-12-07 DIAGNOSIS — R079 Chest pain, unspecified: Secondary | ICD-10-CM | POA: Diagnosis not present

## 2015-12-07 DIAGNOSIS — I10 Essential (primary) hypertension: Secondary | ICD-10-CM

## 2015-12-07 LAB — TSH: TSH: 6.431 u[IU]/mL — AB (ref 0.350–4.500)

## 2015-12-07 LAB — TROPONIN I: Troponin I: <0.03 ng/mL (ref <0.031)

## 2015-12-07 MED ORDER — LEVOTHYROXINE SODIUM 100 MCG PO TABS
100.0000 ug | ORAL_TABLET | Freq: Every day | ORAL | Status: DC
  Administered 2015-12-07: 100 ug via ORAL
  Filled 2015-12-07 (×2): qty 1

## 2015-12-07 MED ORDER — PANTOPRAZOLE SODIUM 40 MG PO TBEC
40.0000 mg | DELAYED_RELEASE_TABLET | Freq: Every day | ORAL | Status: DC
  Administered 2015-12-07: 40 mg via ORAL
  Filled 2015-12-07 (×2): qty 1

## 2015-12-07 MED ORDER — ATORVASTATIN CALCIUM 40 MG PO TABS
40.0000 mg | ORAL_TABLET | Freq: Every day | ORAL | Status: DC
  Administered 2015-12-07: 40 mg via ORAL
  Filled 2015-12-07 (×2): qty 1

## 2015-12-07 MED ORDER — IPRATROPIUM-ALBUTEROL 0.5-2.5 (3) MG/3ML IN SOLN: 3.0000 mL | RESPIRATORY_TRACT | Status: DC | PRN

## 2015-12-07 MED ORDER — ONDANSETRON HCL 4 MG/2ML IJ SOLN: 4.0000 mg | Freq: Four times a day (QID) | INTRAMUSCULAR | Status: DC | PRN

## 2015-12-07 MED ORDER — TIOTROPIUM BROMIDE MONOHYDRATE 18 MCG IN CAPS
18.0000 ug | ORAL_CAPSULE | Freq: Every day | RESPIRATORY_TRACT | Status: DC
  Administered 2015-12-07: 18 ug via RESPIRATORY_TRACT
  Filled 2015-12-07: qty 5

## 2015-12-07 MED ORDER — ACETAMINOPHEN 325 MG PO TABS: 650.0000 mg | ORAL_TABLET | ORAL | Status: DC | PRN

## 2015-12-07 MED ORDER — NITROGLYCERIN 0.4 MG SL SUBL: 0.4000 mg | SUBLINGUAL_TABLET | SUBLINGUAL | Status: DC | PRN

## 2015-12-07 MED ORDER — LORATADINE 10 MG PO TABS
10.0000 mg | ORAL_TABLET | Freq: Every day | ORAL | Status: DC
  Administered 2015-12-07: 10 mg via ORAL
  Filled 2015-12-07 (×2): qty 1

## 2015-12-07 MED ORDER — VITAMIN E 180 MG (400 UNIT) PO CAPS
400.0000 [IU] | ORAL_CAPSULE | Freq: Every day | ORAL | Status: DC
  Administered 2015-12-07: 400 [IU] via ORAL
  Filled 2015-12-07 (×2): qty 1

## 2015-12-07 MED ORDER — MORPHINE SULFATE (PF) 2 MG/ML IV SOLN: 2.0000 mg | INTRAVENOUS | Status: DC | PRN

## 2015-12-07 MED ORDER — ALBUTEROL SULFATE HFA 108 (90 BASE) MCG/ACT IN AERS: 2.0000 | INHALATION_SPRAY | Freq: Four times a day (QID) | RESPIRATORY_TRACT | Status: DC | PRN

## 2015-12-07 MED ORDER — HEPARIN SODIUM (PORCINE) 5000 UNIT/ML IJ SOLN
5000.0000 [IU] | Freq: Three times a day (TID) | INTRAMUSCULAR | Status: DC
  Administered 2015-12-07 – 2015-12-08 (×6): 5000 [IU] via SUBCUTANEOUS
  Filled 2015-12-07 (×7): qty 1

## 2015-12-07 MED ORDER — ACETAMINOPHEN 325 MG PO TABS: 650.0000 mg | ORAL_TABLET | Freq: Four times a day (QID) | ORAL | Status: DC | PRN

## 2015-12-07 MED ORDER — CLOPIDOGREL BISULFATE 75 MG PO TABS
75.0000 mg | ORAL_TABLET | Freq: Every day | ORAL | Status: DC
  Administered 2015-12-07: 75 mg via ORAL
  Filled 2015-12-07 (×2): qty 1

## 2015-12-07 MED ORDER — FLUTICASONE PROPIONATE 50 MCG/ACT NA SUSP
2.0000 | Freq: Every day | NASAL | Status: DC
  Administered 2015-12-07: 2 via NASAL
  Filled 2015-12-07: qty 16

## 2015-12-07 MED ORDER — CARVEDILOL 6.25 MG PO TABS
6.2500 mg | ORAL_TABLET | Freq: Two times a day (BID) | ORAL | Status: DC
  Administered 2015-12-07 – 2015-12-08 (×3): 6.25 mg via ORAL
  Filled 2015-12-07 (×4): qty 1

## 2015-12-07 MED ORDER — ASPIRIN EC 81 MG PO TBEC
81.0000 mg | DELAYED_RELEASE_TABLET | Freq: Every day | ORAL | Status: DC
Start: 2015-12-07 — End: 2015-12-08
  Administered 2015-12-07: 81 mg via ORAL
  Filled 2015-12-07 (×2): qty 1

## 2015-12-07 NOTE — ED Notes (Signed)
He is eating lunch, so phlebotomy deferred at this time. (TSH).

## 2015-12-07 NOTE — ED Notes (Signed)
Provided patient a [place] sandwich and ginger ale with permission from Dr. Darene Lamer. Opyd. Pt can have food til 4:00am.

## 2015-12-07 NOTE — ED Notes (Signed)
George Robbins has just phoned from [employer]. To inform us that pt. Is scheduled to have N.M. Cardiac stress test at 0800 hours tomorrow 12-08-2015.  She further recommends we notify [facility] ahead of time to facilitate this.
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
## 2015-12-07 NOTE — ED Notes (Signed)
He remains in no distress and I take him to the floor at this time.
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
## 2015-12-07 NOTE — Progress Notes (Signed)
PROGRESS NOTE  George Robbins [id number] DOB: 01-23-1951 DOA: 12/06/2015 PCP: Lance Bosch, NP  HPI: 65 year old male who presented to the ED from home after an episode of resting chest tightness lasting a few minutes, followed by shortness of breath.   Subjective / 24 H Interval events - Currently [name] denies any chest pain, dyspnea, abdominal pain, or swelling in his extremities.   Assessment/Plan:  Chest pain and dyspnea in patient with known CAD (s/p PCI 03/2014) - chest pain resolved, lasted ~1h. Possibly could represent reflux symptoms but will r/o cardiac cause - Echo 03/2014 with LVEF 45-50% - Continue ASA, plavix, Lipitor and Coreg. NTG prn for chest pain - cardiology consulted, appreciate input, plan for Stress test tomorrow  COPD - Continue Spiriva, with prn nebs - Currently on 2L via [place] - no wheezing, no decompensation  HTN - BP well controlled, monitor  - Continue Coreg  Documented CKD stage III - Renal function stable, baseline Cr approximately 1.1-1.3 - Chart review suggests the patient is more consistent with CKD stage II - Monitor   Leukocytosis, suspect stress induced, CXR with no acute disease process. Follow. Afebrile.   GERD, continue PPI  Hypothyroidism, continue Synthroid - last TSH elevated, will repeat TSH today   Diet: NPO for stress test Fluids: None at this time,  DVT Prophylaxis: prophylactic Heparin   Code Status: Full Code Family Communication: None at bedside Disposition Plan: home when ready  Barriers to discharge: stress test   Consultants:  Cardiology  Procedures:  Stress Test planned for tomorrow   Antibiotics  Anti-infectives    None       Studies  Dg Chest Portable 1 View  12/06/2015  CLINICAL DATA:  65 year old male with shortness of breath EXAM: PORTABLE CHEST 1 VIEW COMPARISON:  Radiograph dated 08/05/2015 FINDINGS: Single-view of the chest demonstrates stable elevated appearance of the  left hemidiaphragm with subsegmental atelectasis/ scarring at the left lung base. There is no focal consolidation, pleural effusion, or pneumothorax. Stable cardiac silhouette. The osseous structures appear unremarkable. IMPRESSION: No active disease. Electronically Signed   By: Anner Crete M.D.   On: 12/06/2015 22:26    Objective  Filed Vitals:   12/07/15 0600 12/07/15 0700 12/07/15 0756 12/07/15 0956  BP: 93/68 99/67 100/68 106/68  Pulse: 69 66 76 66  Temp:   98 F (36.7 C)   TempSrc:   Oral   Resp: 21 20 19 21   SpO2: 98% 96% 97% 100%    Intake/Output Summary (Last 24 hours) at 12/07/15 1201 Last data filed at 12/07/15 0650  Gross per 24 hour  Intake      0 ml  Output    900 ml  Net   -900 ml   There were no vitals filed for this visit.  Exam:  GENERAL: NAD,   HEENT: no scleral icterus, PERRL  NECK: supple  LUNGS: CTA biL, no wheezing  HEART: RRR without MRG  ABDOMEN: soft, non tender, nondistended  EXTREMITIES: no clubbing / cyanosis, no peripheral edema  NEUROLOGIC: non focal  PSYCHIATRIC: normal mood and affect, cooperative  SKIN: no rashes  Data Reviewed: Basic Metabolic Panel:  Recent Labs Lab 12/06/15 2305  NA 142  K 3.9  CL 105  CO2 26  GLUCOSE 106*  BUN 16  CREATININE 1.29*  CALCIUM 9.5   Liver Function Tests:  Recent Labs Lab 12/06/15 2305  AST 29  ALT 26  ALKPHOS 88  BILITOT 0.4  PROT  7.2  ALBUMIN 4.1   CBC:  Recent Labs Lab 12/06/15 2235  WBC 12.6*  HGB 14.0  HCT 42.1  MCV 88.6  PLT 188   Cardiac Enzymes:  Recent Labs Lab 12/07/15 0053 12/07/15 0343 12/07/15 0627  TROPONINI <0.03 <0.03 <0.03   Scheduled Meds: . aspirin EC  81 mg Oral Daily  . atorvastatin  40 mg Oral q1800  . carvedilol  6.25 mg Oral BID WC  . clopidogrel  75 mg Oral Daily  . fluticasone  2 spray Each Nare Daily  . heparin  5,000 Units Subcutaneous 3 times per day  . levothyroxine  100 mcg Oral QAC breakfast  . loratadine  10 mg  Oral Daily  . pantoprazole  40 mg Oral Daily  . tiotropium  18 mcg Inhalation Daily  . vitamin E  400 Units Oral Daily   Continuous Infusions:   Marzetta Board, MD [facility] Pager [phone]. If 7 PM - 7 AM, please contact night-coverage at [email or web address], password [facility] 12/07/2015, 12:01 PM

## 2015-12-08 ENCOUNTER — Encounter (HOSPITAL_COMMUNITY)
Admission: RE | Admit: 2015-12-08 | Discharge: 2015-12-08 | Disposition: A | Discharge status: Home / Self Care | Payer: Medicare Other | Source: Ambulatory Visit | Attending: Family Medicine | Admitting: Family Medicine

## 2015-12-08 ENCOUNTER — Ambulatory Visit (HOSPITAL_COMMUNITY)
Admit: 2015-12-08 | Discharge: 2015-12-08 | Disposition: A | Discharge status: Home / Self Care | Payer: Medicare Other | Attending: Family Medicine | Admitting: Family Medicine

## 2015-12-08 DIAGNOSIS — I10 Essential (primary) hypertension: Secondary | ICD-10-CM | POA: Diagnosis not present

## 2015-12-08 DIAGNOSIS — N182 Chronic kidney disease, stage 2 (mild): Secondary | ICD-10-CM | POA: Diagnosis not present

## 2015-12-08 DIAGNOSIS — N183 Chronic kidney disease, stage 3 (moderate): Secondary | ICD-10-CM | POA: Diagnosis not present

## 2015-12-08 DIAGNOSIS — I2510 Atherosclerotic heart disease of native coronary artery without angina pectoris: Secondary | ICD-10-CM | POA: Diagnosis not present

## 2015-12-08 DIAGNOSIS — I129 Hypertensive chronic kidney disease with stage 1 through stage 4 chronic kidney disease, or unspecified chronic kidney disease: Secondary | ICD-10-CM | POA: Diagnosis not present

## 2015-12-08 DIAGNOSIS — I25119 Atherosclerotic heart disease of native coronary artery with unspecified angina pectoris: Secondary | ICD-10-CM | POA: Diagnosis not present

## 2015-12-08 DIAGNOSIS — J438 Other emphysema: Secondary | ICD-10-CM | POA: Diagnosis not present

## 2015-12-08 DIAGNOSIS — R079 Chest pain, unspecified: Secondary | ICD-10-CM

## 2015-12-08 DIAGNOSIS — E039 Hypothyroidism, unspecified: Secondary | ICD-10-CM

## 2015-12-08 LAB — NM MYOCAR MULTI W/SPECT W/WALL MOTION / EF
CHL CUP NUCLEAR SDS: 1
CHL CUP NUCLEAR SSS: 7
CHL CUP RESTING HR STRESS: 82 {beats}/min
CSEPEW: 1 METS
CSEPPHR: 118 {beats}/min
Exercise duration (min): 5 min
Exercise duration (sec): 16 s
LHR: 0.25
LV dias vol: 65 mL
LVSYSVOL: 27 mL
SRS: 6
TID: 1

## 2015-12-08 MED ORDER — TECHNETIUM TC 99M SESTAMIBI GENERIC - CARDIOLITE
30.0000 | Freq: Once | INTRAVENOUS | Status: AC | PRN
  Administered 2015-12-08: 30 via INTRAVENOUS

## 2015-12-08 MED ORDER — REGADENOSON 0.4 MG/5ML IV SOLN
0.4000 mg | Freq: Once | INTRAVENOUS | Status: AC
  Administered 2015-12-08: 0.4 mg via INTRAVENOUS

## 2015-12-08 MED ORDER — REGADENOSON 0.4 MG/5ML IV SOLN
INTRAVENOUS | Status: AC
Start: 2015-12-08 — End: 2015-12-08
  Filled 2015-12-08: qty 5

## 2015-12-08 MED ORDER — TECHNETIUM TC 99M SESTAMIBI GENERIC - CARDIOLITE
10.0000 | Freq: Once | INTRAVENOUS | Status: AC | PRN
  Administered 2015-12-08: 10 via INTRAVENOUS

## 2015-12-08 NOTE — Care Management Note (Signed)
Case Management Note  Patient Details  Name: George Robbins MRN: [id number] Date of Birth: June 02, 1951  Subjective/Objective:  66 y/o m admitted w/CKD. From home.                  Action/Plan:d/c plan home.   Expected Discharge Date:   (unknown)               Expected Discharge Plan:  Home/Self Care  In-House Referral:     Discharge planning Services  CM Consult  Post Acute Care Choice:    Choice offered to:     DME Arranged:    DME Agency:     HH Arranged:    HH Agency:     Status of Service:  In process, will continue to follow  Medicare Important Message Given:    Date Medicare IM Given:    Medicare IM give by:    Date Additional Medicare IM Given:    Additional Medicare Important Message give by:     If discussed at [place] of Stay Meetings, dates discussed:    Additional Comments:  Dessa Phi, RN 12/08/2015, 9:47 AM

## 2015-12-08 NOTE — Care Management Note (Signed)
Case Management Note  Patient Details  Name: HARPAL SUMMERSON MRN: [id number] Date of Birth: 1951-10-27  Subjective/Objective:                    Action/Plan:d/c home no needs or orders.   Expected Discharge Date:   (unknown)               Expected Discharge Plan:  Home/Self Care  In-House Referral:     Discharge planning Services  CM Consult  Post Acute Care Choice:    Choice offered to:     DME Arranged:    DME Agency:     HH Arranged:    [place] Agency:     Status of Service:  Completed, signed off  Medicare Important Message Given:    Date Medicare IM Given:    Medicare IM give by:    Date Additional Medicare IM Given:    Additional Medicare Important Message give by:     If discussed at [place] of Stay Meetings, dates discussed:    Additional Comments:  Dessa Phi, RN 12/08/2015, 3:58 PM

## 2015-12-08 NOTE — Progress Notes (Signed)
Received report from [place]. Pt vitals stable, reports no complaints at this time. Monitored on telemetry, [facility]

## 2015-12-08 NOTE — Discharge Summary (Signed)
Physician Discharge Summary  MYNOR HILFIKER [id number] DOB: 06/15/1951 DOA: 12/06/2015  PCP: Lance Bosch, NP  Admit date: 12/06/2015 Discharge date: 12/08/2015  Time spent: >30 minutes  Recommendations for Outpatient Follow-up:  1. Follow up with PCP as needed  Discharge Diagnoses:  Principal Problem:   CKD (chronic kidney disease) stage 2, GFR 60-89 ml/min Active Problems:   HTN (hypertension)   CAD (coronary artery disease)   COPD (chronic obstructive pulmonary disease) (HCC)   Chest pain with moderate risk for cardiac etiology   Chest pain  Discharge Condition: stable  Diet recommendation: heart healthy  Filed Weights   12/07/15 1618  Weight: 83.87 kg (184 lb 14.4 oz)    History of present illness:  See H&P, Labs, Consult and Test reports for all details in brief, patient is a 65 year old male who presented to the ED from home after an episode of resting chest tightness lasting a few minutes, followed by shortness of breath.   Hospital Course:  Chest pain and dyspnea in patient with known CAD (s/p PCI 03/2014) - chest pain resolved, lasted ~1h. Possibly could represent reflux symptoms but will r/o cardiac cause. Patient was monitored on telemetry without significant events. Troponin was negative x 3. Cardiology was consulted and have followed. He underwent a stress test which was low risk, had a small fixed inferior defect. EF 61% with normal wall motion. Discussed with cardiology, continue medical management. He was chest pain free, at baseline and was discharged home in stable condition.  COPD - Continue Spiriva, with prn nebs, no wheezing, no decompensation HTN - continue home medications Documented CKD stage III - Renal function stable, baseline Cr approximately 1.1-1.3 Leukocytosis, suspect stress induced, CXR with no acute disease process. Follow. Afebrile.  GERD, continue PPI Hypothyroidism, continue Synthroid. Last TSH elevated at 16 in 09/2015, repeat  TSH improved to 6.4. Outpatient follow up.   Procedures:  Stress test  Consultations:  Cardiology  Discharge Exam: Filed Vitals:   12/08/15 0511 12/08/15 1414 12/08/15 1551 12/08/15 1608  BP: 103/74 136/80    Pulse: 98 79    Temp: 98 F (36.7 C) 98.1 F (36.7 C)    TempSrc: Oral Oral    Resp: 16 20    Height:      Weight:      SpO2: 98% 95% 100% 95%    General: NAD Cardiovascular: RRR Respiratory: CTA biL  Discharge Instructions Activity:  As tolerated   Get Medicines reviewed and adjusted: Please take all your medications with you for your next visit with your Primary MD  Please request your Primary MD to go over all hospital tests and procedure/radiological results at the follow up, please ask your Primary MD to get all Hospital records sent to his/her office.  If you experience worsening of your admission symptoms, develop shortness of breath, life threatening emergency, suicidal or homicidal thoughts you must seek medical attention immediately by calling 911 or calling your MD immediately if symptoms less severe.  You must read complete instructions/literature along with all the possible adverse reactions/side effects for all the Medicines you take and that have been prescribed to you. Take any new Medicines after you have completely understood and accpet all the possible adverse reactions/side effects.   Do not drive when taking Pain medications.   Do not take more than prescribed Pain, Sleep and Anxiety Medications  Special Instructions: If you have smoked or chewed Tobacco in the last 2 yrs please stop smoking, stop  any regular Alcohol and or any Recreational drug use.  Wear Seat belts while driving.  Please note  You were cared for by a hospitalist during your hospital stay. Once you are discharged, your primary care physician will handle any further medical issues. Please note that NO REFILLS for any discharge medications will be authorized once you  are discharged, as it is imperative that you return to your primary care physician (or establish a relationship with a primary care physician if you do not have one) for your aftercare needs so that they can reassess your need for medications and monitor your lab values.    Medication List    TAKE these medications        acetaminophen 325 MG tablet  Commonly known as:  TYLENOL  Take 650 mg by mouth every 6 (six) hours as needed for moderate pain.     albuterol 108 (90 Base) MCG/ACT inhaler  Commonly known as:  PROVENTIL HFA;VENTOLIN HFA  Inhale 2 puffs into the lungs every 6 (six) hours as needed for wheezing or shortness of breath. For cough     aspirin EC 81 MG tablet  Take 1 tablet (81 mg total) by mouth daily.     atorvastatin 40 MG tablet  Commonly known as:  LIPITOR  Take 1 tablet (40 mg total) by mouth daily.     carvedilol 6.25 MG tablet  Commonly known as:  COREG  Take 1 tablet (6.25 mg total) by mouth 2 (two) times daily with a meal.     clopidogrel 75 MG tablet  Commonly known as:  PLAVIX  take 1 tablet by mouth once daily WITH BREAKFAST     fluticasone 50 MCG/ACT nasal spray  Commonly known as:  FLONASE  Place 2 sprays into both nostrils daily. For allergies     levothyroxine 100 MCG tablet  Commonly known as:  SYNTHROID, LEVOTHROID  TAKE 1 TABLET BY MOUTH ONCE A DAY BEFORE BREAKFAST     loratadine 10 MG tablet  Commonly known as:  CLARITIN  Take 1 tablet (10 mg total) by mouth daily.     nitroGLYCERIN 0.4 MG SL tablet  Commonly known as:  NITROSTAT  Place 1 tablet (0.4 mg total) under the tongue every 5 (five) minutes as needed for chest pain.     pantoprazole 40 MG tablet  Commonly known as:  PROTONIX  Take 30- 60 min before your first and last meals of the day     Tiotropium Bromide Monohydrate 1.25 MCG/ACT Aers  Commonly known as:  SPIRIVA RESPIMAT  Inhale 2 puffs into the lungs daily.     vitamin E 400 UNIT capsule  Take 400 Units by mouth  daily.           Follow-up Information    Follow up with Lance Bosch, NP. Schedule an appointment as soon as possible for a visit in 3 weeks.   Specialty:  Internal Medicine   Contact information:   [place] [address] [phone]       The results of significant diagnostics from this hospitalization (including imaging, microbiology, ancillary and laboratory) are listed below for reference.    Significant Diagnostic Studies: Nm Myocar Multi W/spect W/wall Motion / Ef  12/08/2015   There was no ST segment deviation noted during stress.  T wave inversion was noted during stress.  This is a low risk study.  The left ventricular ejection fraction is normal (55-65%).  Nuclear stress EF: 61%.  Low risk stress nuclear study with a small, mild, fixed inferior defect consistent with thinning; no ischemia; EF 61 with normal wall motion; note study suboptimal due to bowel activity.   Dg Chest Portable 1 View  12/06/2015  CLINICAL DATA:  65 year old male with shortness of breath EXAM: PORTABLE CHEST 1 VIEW COMPARISON:  Radiograph dated 08/05/2015 FINDINGS: Single-view of the chest demonstrates stable elevated appearance of the left hemidiaphragm with subsegmental atelectasis/ scarring at the left lung base. There is no focal consolidation, pleural effusion, or pneumothorax. Stable cardiac silhouette. The osseous structures appear unremarkable. IMPRESSION: No active disease. Electronically Signed   By: Anner Crete M.D.   On: 12/06/2015 22:26   Labs: Basic Metabolic Panel:  Recent Labs Lab 12/06/15 2305  NA 142  K 3.9  CL 105  CO2 26  GLUCOSE 106*  BUN 16  CREATININE 1.29*  CALCIUM 9.5   Liver Function Tests:  Recent Labs Lab 12/06/15 2305  AST 29  ALT 26  ALKPHOS 88  BILITOT 0.4  PROT 7.2  ALBUMIN 4.1   CBC:  Recent Labs Lab 12/06/15 2235  WBC 12.6*  HGB 14.0  HCT 42.1  MCV 88.6  PLT 188   Cardiac Enzymes:  Recent Labs Lab  12/07/15 0053 12/07/15 0343 12/07/15 0627  TROPONINI <0.03 <0.03 <0.03    Signed:  Marzetta Board  [facility] 12/08/2015, 6:02 PM

## 2015-12-08 NOTE — Progress Notes (Signed)
Procedure finished, pt tolerated well. Will continue to monitor on telemetry

## 2015-12-08 NOTE — Progress Notes (Signed)
Patient presented for Lexiscan. Tolerated procedure well. Result to follow. One day study.   George Robbins, [name]

## 2015-12-08 NOTE — Care Management Note (Deleted)
Case Management Note  Patient Details  Name: George Robbins MRN: [id number] Date of Birth: 08-Jan-1951  Subjective/Objective:   65 y/o m admitted w/CKD. From home. PT cons-await recc.                 Action/Plan:d/c plan home.   Expected Discharge Date:   (unknown)               Expected Discharge Plan:  Home/Self Care  In-House Referral:     Discharge planning Services  CM Consult  Post Acute Care Choice:    Choice offered to:     DME Arranged:    DME Agency:     HH Arranged:    HH Agency:     Status of Service:  In process, will continue to follow  Medicare Important Message Given:    Date Medicare IM Given:    Medicare IM give by:    Date Additional Medicare IM Given:    Additional Medicare Important Message give by:     If discussed at [place] of Stay Meetings, dates discussed:    Additional Comments:  Dessa Phi, RN 12/08/2015, 9:40 AM

## 2015-12-08 NOTE — Progress Notes (Signed)
Patient Name: George Robbins Date of Encounter: 12/08/2015   SUBJECTIVE  Seen in nuc med. Feeling well. No chest pain or palpitations. Stable sob on supplement oxygen.   CURRENT MEDS . aspirin EC  81 mg Oral Daily  . atorvastatin  40 mg Oral q1800  . carvedilol  6.25 mg Oral BID WC  . clopidogrel  75 mg Oral Daily  . fluticasone  2 spray Each Nare Daily  . heparin  5,000 Units Subcutaneous 3 times per day  . levothyroxine  100 mcg Oral QAC breakfast  . loratadine  10 mg Oral Daily  . pantoprazole  40 mg Oral Daily  . tiotropium  18 mcg Inhalation Daily  . vitamin E  400 Units Oral Daily    OBJECTIVE  Filed Vitals:   12/07/15 1618 12/07/15 2029 12/08/15 0204 12/08/15 0511  BP:  107/64 91/65 103/74  Pulse:  87 84 98  Temp:  98 F (36.7 C) 97.8 F (36.6 C) 98 F (36.7 C)  TempSrc:  Oral Oral Oral  Resp:  16 16 16   Height: 4\' 10"  (1.473 m)     Weight: 184 lb 14.4 oz (83.87 kg)     SpO2:  97% 97% 98%    Intake/Output Summary (Last 24 hours) at 12/08/15 0945 Last data filed at 12/08/15 0721  Gross per 24 hour  Intake      0 ml  Output   1925 ml  Net  -1925 ml   Filed Weights   12/07/15 1618  Weight: 184 lb 14.4 oz (83.87 kg)    PHYSICAL EXAM  General: Pleasant, NAD. Neuro: Alert and oriented X 3. Moves all extremities spontaneously. Psych: Normal affect. HEENT:  Normal  Neck: Supple without bruits or JVD. Lungs:  Resp regular and unlabored, CTA with scattered rhonchi.  Heart: RRR no s3, s4, or murmurs. Abdomen: Soft, non-tender, non-distended, BS + x 4.  Extremities: No clubbing, cyanosis or edema. DP/PT/Radials 2+ and equal bilaterally.  Accessory Clinical Findings  CBC  Recent Labs  12/06/15 2235  WBC 12.6*  HGB 14.0  HCT 42.1  MCV 88.6  PLT 0000000   Basic Metabolic Panel  Recent Labs  12/06/15 2305  NA 142  K 3.9  CL 105  CO2 26  GLUCOSE 106*  BUN 16  CREATININE 1.29*  CALCIUM 9.5   Liver Function Tests  Recent Labs  12/06/15 2305  AST 29  ALT 26  ALKPHOS 88  BILITOT 0.4  PROT 7.2  ALBUMIN 4.1   No results for input(s): LIPASE, AMYLASE in the last 72 hours. Cardiac Enzymes  Recent Labs  12/07/15 0053 12/07/15 0343 12/07/15 0627  TROPONINI <0.03 <0.03 <0.03  Thyroid Function Tests  Recent Labs  12/07/15 1436  TSH 6.431*    TELE  Unable to review as patient seen in nuc med.   Radiology/Studies  Dg Chest Portable 1 View  12/06/2015  CLINICAL DATA:  65 year old male with shortness of breath EXAM: PORTABLE CHEST 1 VIEW COMPARISON:  Radiograph dated 08/05/2015 FINDINGS: Single-view of the chest demonstrates stable elevated appearance of the left hemidiaphragm with subsegmental atelectasis/ scarring at the left lung base. There is no focal consolidation, pleural effusion, or pneumothorax. Stable cardiac silhouette. The osseous structures appear unremarkable. IMPRESSION: No active disease. Electronically Signed   By: Anner Crete M.D.   On: 12/06/2015 22:26    ASSESSMENT AND PLAN Principal Problem:   CKD (chronic kidney disease) stage 2, GFR 60-89 ml/min Active Problems:   HTN (hypertension)  CAD (coronary artery disease)   COPD (chronic obstructive pulmonary disease) (HCC)   Chest pain with moderate risk for cardiac etiology   Chest pain    Plan: troponin x 3 negative. TSH high (work up per primary). Pending myoview result. Continue current medications.   Jarrett Soho PA-C Pager [phone]  Patient seen and examined and history reviewed. Agree with above findings and plan. No further chest pain. Cardiac enzymes all negative. Ecg without change in nonspecific TWA. Proceed with nuclear stress today. If low risk continue medical therapy and DC later today. If significant ischemia he will need cath.  Peter Martinique, [place] 12/08/2015 11:07 AM

## 2015-12-08 NOTE — Discharge Instructions (Signed)
Follow with Lance Bosch, NP in 5-7 days  Please get a complete blood count and chemistry panel checked by your Primary MD at your next visit, and again as instructed by your Primary MD. Please get your medications reviewed and adjusted by your Primary MD.  Please request your Primary MD to go over all Hospital Tests and Procedure/Radiological results at the follow up, please get all Hospital records sent to your Prim MD by signing hospital release before you go home.  If you had Pneumonia of Lung problems at the Hospital: Please get a 2 view Chest X ray done in 6-8 weeks after hospital discharge or sooner if instructed by your Primary MD.  If you have Congestive Heart Failure: Please call your Cardiologist or Primary MD anytime you have any of the following symptoms:  1) 3 pound weight gain in 24 hours or 5 pounds in 1 week  2) shortness of breath, with or without a dry hacking cough  3) swelling in the hands, feet or stomach  4) if you have to sleep on extra pillows at night in order to breathe  Follow cardiac low salt diet and 1.5 lit/day fluid restriction.  If you have diabetes Accuchecks 4 times/day, Once in AM empty stomach and then before each meal. Log in all results and show them to your primary doctor at your next visit. If any glucose reading is under 80 or above 300 call your primary MD immediately.  If you have Seizure/Convulsions/Epilepsy: Please do not drive, operate heavy machinery, participate in activities at heights or participate in high speed sports until you have seen by Primary MD or a Neurologist and advised to do so again.  If you had Gastrointestinal Bleeding: Please ask your Primary MD to check a complete blood count within one week of discharge or at your next visit. Your endoscopic/colonoscopic biopsies that are pending at the time of discharge, will also need to followed by your Primary MD.  Get Medicines reviewed and adjusted. Please take all your  medications with you for your next visit with your Primary MD  Please request your Primary MD to go over all hospital tests and procedure/radiological results at the follow up, please ask your Primary MD to get all Hospital records sent to his/her office.  If you experience worsening of your admission symptoms, develop shortness of breath, life threatening emergency, suicidal or homicidal thoughts you must seek medical attention immediately by calling 911 or calling your MD immediately  if symptoms less severe.  You must read complete instructions/literature along with all the possible adverse reactions/side effects for all the Medicines you take and that have been prescribed to you. Take any new Medicines after you have completely understood and accpet all the possible adverse reactions/side effects.   Do not drive or operate heavy machinery when taking Pain medications.   Do not take more than prescribed Pain, Sleep and Anxiety Medications  Special Instructions: If you have smoked or chewed Tobacco  in the last 2 yrs please stop smoking, stop any regular Alcohol  and or any Recreational drug use.  Wear Seat belts while driving.  Please note You were cared for by a hospitalist during your hospital stay. If you have any questions about your discharge medications or the care you received while you were in the hospital after you are discharged, you can call the unit and asked to speak with the hospitalist on call if the hospitalist that took care of you is not available.  Once you are discharged, your primary care physician will handle any further medical issues. Please note that NO REFILLS for any discharge medications will be authorized once you are discharged, as it is imperative that you return to your primary care physician (or establish a relationship with a primary care physician if you do not have one) for your aftercare needs so that they can reassess your need for medications and monitor your  lab values.  You can reach the hospitalist office at phone [phone] or fax [phone]   If you do not have a primary care physician, you can call [phone] for a physician referral.  Activity: As tolerated with Full fall precautions use walker/cane & assistance as needed  Diet: heart healthy  Disposition Home

## 2015-12-28 ENCOUNTER — Emergency Department (HOSPITAL_COMMUNITY)
Admission: EM | Admit: 2015-12-28 | Discharge: 2015-12-28 | Disposition: A | Discharge status: Home / Self Care | Payer: Medicare Other | Attending: Emergency Medicine | Admitting: Emergency Medicine

## 2015-12-28 ENCOUNTER — Encounter (HOSPITAL_COMMUNITY): Payer: Self-pay

## 2015-12-28 DIAGNOSIS — N183 Chronic kidney disease, stage 3 (moderate): Secondary | ICD-10-CM | POA: Insufficient documentation

## 2015-12-28 DIAGNOSIS — Z7982 Long term (current) use of aspirin: Secondary | ICD-10-CM | POA: Insufficient documentation

## 2015-12-28 DIAGNOSIS — M545 Low back pain: Secondary | ICD-10-CM | POA: Diagnosis present

## 2015-12-28 DIAGNOSIS — E039 Hypothyroidism, unspecified: Secondary | ICD-10-CM | POA: Diagnosis not present

## 2015-12-28 DIAGNOSIS — Z87891 Personal history of nicotine dependence: Secondary | ICD-10-CM | POA: Diagnosis not present

## 2015-12-28 DIAGNOSIS — Z7902 Long term (current) use of antithrombotics/antiplatelets: Secondary | ICD-10-CM | POA: Insufficient documentation

## 2015-12-28 DIAGNOSIS — K219 Gastro-esophageal reflux disease without esophagitis: Secondary | ICD-10-CM | POA: Insufficient documentation

## 2015-12-28 DIAGNOSIS — I2510 Atherosclerotic heart disease of native coronary artery without angina pectoris: Secondary | ICD-10-CM | POA: Insufficient documentation

## 2015-12-28 DIAGNOSIS — Z79899 Other long term (current) drug therapy: Secondary | ICD-10-CM | POA: Diagnosis not present

## 2015-12-28 DIAGNOSIS — I129 Hypertensive chronic kidney disease with stage 1 through stage 4 chronic kidney disease, or unspecified chronic kidney disease: Secondary | ICD-10-CM | POA: Diagnosis not present

## 2015-12-28 LAB — URINALYSIS, ROUTINE W REFLEX MICROSCOPIC
Bilirubin Urine: NEGATIVE
GLUCOSE, UA: NEGATIVE mg/dL
Hgb urine dipstick: NEGATIVE
Ketones, ur: NEGATIVE mg/dL
Leukocytes, UA: NEGATIVE
Nitrite: NEGATIVE
PH: 6 (ref 5.0–8.0)
Protein, ur: NEGATIVE mg/dL
Specific Gravity, Urine: 1.01 (ref 1.005–1.030)

## 2015-12-28 LAB — CBC
HEMATOCRIT: 42.4 % (ref 39.0–52.0)
Hemoglobin: 14.4 g/dL (ref 13.0–17.0)
MCH: 29 pg (ref 26.0–34.0)
MCHC: 34 g/dL (ref 30.0–36.0)
MCV: 85.5 fL (ref 78.0–100.0)
Platelets: 179 10*3/uL (ref 150–400)
RBC: 4.96 MIL/uL (ref 4.22–5.81)
RDW: 14.1 % (ref 11.5–15.5)
WBC: 9.7 10*3/uL (ref 4.0–10.5)

## 2015-12-28 LAB — COMPREHENSIVE METABOLIC PANEL
ALT: 25 U/L (ref 17–63)
AST: 22 U/L (ref 15–41)
Albumin: 4.4 g/dL (ref 3.5–5.0)
Alkaline Phosphatase: 93 U/L (ref 38–126)
Anion gap: 10 (ref 5–15)
BUN: 10 mg/dL (ref 6–20)
CHLORIDE: 104 mmol/L (ref 101–111)
CO2: 25 mmol/L (ref 22–32)
Calcium: 10 mg/dL (ref 8.9–10.3)
Creatinine, Ser: 1.3 mg/dL — ABNORMAL HIGH (ref 0.61–1.24)
GFR, EST NON AFRICAN AMERICAN: 57 mL/min — AB (ref >60)
Glucose, Bld: 120 mg/dL — ABNORMAL HIGH (ref 65–99)
POTASSIUM: 3.9 mmol/L (ref 3.5–5.1)
SODIUM: 139 mmol/L (ref 135–145)
Total Bilirubin: 0.3 mg/dL (ref 0.3–1.2)
Total Protein: 7.6 g/dL (ref 6.5–8.1)

## 2015-12-28 LAB — LIPASE, BLOOD: LIPASE: 36 U/L (ref 11–51)

## 2015-12-28 NOTE — Discharge Instructions (Signed)

## 2015-12-28 NOTE — ED Notes (Signed)
Pt here for abd pain and back pain since being in the hospital on the 10th at [facility]. Pt states he threw up yesterday and coughing. No diarrhea.
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
## 2016-01-02 ENCOUNTER — Encounter: Payer: Self-pay | Admitting: Internal Medicine

## 2016-01-02 ENCOUNTER — Ambulatory Visit: Payer: Medicare Other | Attending: Internal Medicine | Admitting: Internal Medicine

## 2016-01-02 VITALS — BP 118/82 | HR 80 | Temp 98.0°F | Resp 17 | Ht <= 58 in | Wt 185.0 lb

## 2016-01-02 DIAGNOSIS — L309 Dermatitis, unspecified: Secondary | ICD-10-CM

## 2016-01-02 DIAGNOSIS — R040 Epistaxis: Secondary | ICD-10-CM

## 2016-01-02 NOTE — Patient Instructions (Addendum)
Nasal saline and Humidifier for nose bleeds. This will help with moisture   Take Synthroid/Levothyroxine as soon as you wake up on a empty stomach around 6 am  Take Pantoprazole at 730 am. Then you may eat breakfast at 8.  After breakfast you may take the rest of your medication.
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
## 2016-01-02 NOTE — Progress Notes (Signed)
Patient here for follow up from the ED Was seen in the ED for a pulled muscle to his back And told to follow up with his doctor

## 2016-01-04 ENCOUNTER — Other Ambulatory Visit: Payer: Self-pay | Admitting: *Deleted

## 2016-01-04 MED ORDER — ALBUTEROL SULFATE HFA 108 (90 BASE) MCG/ACT IN AERS: 2.0000 | INHALATION_SPRAY | Freq: Four times a day (QID) | RESPIRATORY_TRACT | Status: DC | PRN

## 2016-01-12 ENCOUNTER — Emergency Department (HOSPITAL_COMMUNITY): Payer: Medicare Other

## 2016-01-12 ENCOUNTER — Encounter (HOSPITAL_COMMUNITY): Payer: Self-pay | Admitting: *Deleted

## 2016-01-12 ENCOUNTER — Emergency Department (HOSPITAL_COMMUNITY)
Admission: EM | Admit: 2016-01-12 | Discharge: 2016-01-13 | Disposition: A | Discharge status: Home / Self Care | Payer: Medicare Other | Attending: Emergency Medicine | Admitting: Emergency Medicine

## 2016-01-12 DIAGNOSIS — Z9889 Other specified postprocedural states: Secondary | ICD-10-CM | POA: Diagnosis not present

## 2016-01-12 DIAGNOSIS — Z8709 Personal history of other diseases of the respiratory system: Secondary | ICD-10-CM | POA: Insufficient documentation

## 2016-01-12 DIAGNOSIS — Z7902 Long term (current) use of antithrombotics/antiplatelets: Secondary | ICD-10-CM | POA: Insufficient documentation

## 2016-01-12 DIAGNOSIS — Z87891 Personal history of nicotine dependence: Secondary | ICD-10-CM | POA: Insufficient documentation

## 2016-01-12 DIAGNOSIS — I129 Hypertensive chronic kidney disease with stage 1 through stage 4 chronic kidney disease, or unspecified chronic kidney disease: Secondary | ICD-10-CM | POA: Insufficient documentation

## 2016-01-12 DIAGNOSIS — Z79899 Other long term (current) drug therapy: Secondary | ICD-10-CM | POA: Insufficient documentation

## 2016-01-12 DIAGNOSIS — Z7982 Long term (current) use of aspirin: Secondary | ICD-10-CM | POA: Diagnosis not present

## 2016-01-12 DIAGNOSIS — K219 Gastro-esophageal reflux disease without esophagitis: Secondary | ICD-10-CM | POA: Insufficient documentation

## 2016-01-12 DIAGNOSIS — R0602 Shortness of breath: Secondary | ICD-10-CM | POA: Insufficient documentation

## 2016-01-12 DIAGNOSIS — R1111 Vomiting without nausea: Secondary | ICD-10-CM | POA: Insufficient documentation

## 2016-01-12 DIAGNOSIS — R112 Nausea with vomiting, unspecified: Secondary | ICD-10-CM | POA: Diagnosis present

## 2016-01-12 DIAGNOSIS — E039 Hypothyroidism, unspecified: Secondary | ICD-10-CM | POA: Insufficient documentation

## 2016-01-12 DIAGNOSIS — Z7951 Long term (current) use of inhaled steroids: Secondary | ICD-10-CM | POA: Insufficient documentation

## 2016-01-12 DIAGNOSIS — I2510 Atherosclerotic heart disease of native coronary artery without angina pectoris: Secondary | ICD-10-CM | POA: Insufficient documentation

## 2016-01-12 DIAGNOSIS — N183 Chronic kidney disease, stage 3 (moderate): Secondary | ICD-10-CM | POA: Diagnosis not present

## 2016-01-12 DIAGNOSIS — R069 Unspecified abnormalities of breathing: Secondary | ICD-10-CM | POA: Diagnosis not present

## 2016-01-12 LAB — URINALYSIS, ROUTINE W REFLEX MICROSCOPIC
Bilirubin Urine: NEGATIVE
Glucose, UA: NEGATIVE mg/dL
Hgb urine dipstick: NEGATIVE
KETONES UR: NEGATIVE mg/dL
LEUKOCYTES UA: NEGATIVE
NITRITE: NEGATIVE
PROTEIN: NEGATIVE mg/dL
Specific Gravity, Urine: 1.004 — ABNORMAL LOW (ref 1.005–1.030)
pH: 6 (ref 5.0–8.0)

## 2016-01-12 LAB — CBC
HEMATOCRIT: 40.2 % (ref 39.0–52.0)
Hemoglobin: 13.6 g/dL (ref 13.0–17.0)
MCH: 28.8 pg (ref 26.0–34.0)
MCHC: 33.8 g/dL (ref 30.0–36.0)
MCV: 85 fL (ref 78.0–100.0)
PLATELETS: 155 10*3/uL (ref 150–400)
RBC: 4.73 MIL/uL (ref 4.22–5.81)
RDW: 14.2 % (ref 11.5–15.5)
WBC: 11.3 10*3/uL — ABNORMAL HIGH (ref 4.0–10.5)

## 2016-01-12 NOTE — ED Notes (Signed)
Pt arrives via EMS from home. C/o nausea today, emesis x2. Also c/o shortness of breath. Used inhaler with some relief. 12 lead by EMS normal, vital signs stable.

## 2016-01-12 NOTE — ED Notes (Signed)
Pt taken to xray 
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
## 2016-01-12 NOTE — ED Notes (Signed)
Pt back from Xray. MD at bedside.

## 2016-01-13 LAB — COMPREHENSIVE METABOLIC PANEL
ALBUMIN: 4 g/dL (ref 3.5–5.0)
ALK PHOS: 81 U/L (ref 38–126)
ALT: 24 U/L (ref 17–63)
ANION GAP: 11 (ref 5–15)
AST: 21 U/L (ref 15–41)
BILIRUBIN TOTAL: 0.5 mg/dL (ref 0.3–1.2)
BUN: 12 mg/dL (ref 6–20)
CALCIUM: 9.7 mg/dL (ref 8.9–10.3)
CO2: 26 mmol/L (ref 22–32)
CREATININE: 1.14 mg/dL (ref 0.61–1.24)
Chloride: 104 mmol/L (ref 101–111)
GFR calc Af Amer: >60 mL/min (ref >60)
GFR calc non Af Amer: >60 mL/min (ref >60)
GLUCOSE: 102 mg/dL — AB (ref 65–99)
Potassium: 3.6 mmol/L (ref 3.5–5.1)
Sodium: 141 mmol/L (ref 135–145)
TOTAL PROTEIN: 7.1 g/dL (ref 6.5–8.1)

## 2016-01-13 LAB — LIPASE, BLOOD: Lipase: 40 U/L (ref 11–51)

## 2016-01-13 LAB — MAGNESIUM: Magnesium: 1.9 mg/dL (ref 1.7–2.4)

## 2016-01-13 NOTE — Discharge Instructions (Signed)
Nausea and Vomiting [name], your work up today was normal.  See your primary care doctor within 3 days for close follow up.  If any symptoms worsen, come back to the ED immediately. Thank you. Nausea means you feel sick to your stomach. Throwing up (vomiting) is a reflex where stomach contents come out of your mouth. HOME CARE   Take medicine as told by your doctor.  Do not force yourself to eat. However, you do need to drink fluids.  If you feel like eating, eat a normal diet as told by your doctor.  Eat rice, wheat, potatoes, bread, lean meats, yogurt, fruits, and vegetables.  Avoid high-fat foods.  Drink enough fluids to keep your pee (urine) clear or pale yellow.  Ask your doctor how to replace body fluid losses (rehydrate). Signs of body fluid loss (dehydration) include:  Feeling very thirsty.  Dry lips and mouth.  Feeling dizzy.  Dark pee.  Peeing less than normal.  Feeling confused.  Fast breathing or heart rate. GET HELP RIGHT AWAY IF:   You have blood in your throw up.  You have black or bloody poop (stool).  You have a bad headache or stiff neck.  You feel confused.  You have bad belly (abdominal) pain.  You have chest pain or trouble breathing.  You do not pee at least once every 8 hours.  You have cold, clammy skin.  You keep throwing up after 24 to 48 hours.  You have a fever. MAKE SURE YOU:   Understand these instructions.  Will watch your condition.  Will get help right away if you are not doing well or get worse.   This information is not intended to replace advice given to you by your health care provider. Make sure you discuss any questions you have with your health care provider.   Document Released: 04/30/2008 Document Revised: 02/04/2012 Document Reviewed: 04/13/2011 Elsevier Interactive Patient Education [employer].

## 2016-01-23 ENCOUNTER — Ambulatory Visit: Payer: Medicare Other | Admitting: Emergency Medicine

## 2016-01-23 ENCOUNTER — Ambulatory Visit: Payer: Medicare Other | Admitting: Acute Care

## 2016-01-30 ENCOUNTER — Encounter: Payer: Self-pay | Admitting: Pulmonary Disease

## 2016-01-30 ENCOUNTER — Ambulatory Visit (INDEPENDENT_AMBULATORY_CARE_PROVIDER_SITE_OTHER): Payer: Medicare Other | Admitting: Pulmonary Disease

## 2016-01-30 VITALS — BP 122/80 | HR 81 | Temp 97.3°F | Ht <= 58 in | Wt 182.6 lb

## 2016-01-30 DIAGNOSIS — M549 Dorsalgia, unspecified: Secondary | ICD-10-CM

## 2016-01-30 DIAGNOSIS — R05 Cough: Secondary | ICD-10-CM

## 2016-01-30 DIAGNOSIS — J439 Emphysema, unspecified: Secondary | ICD-10-CM

## 2016-01-30 DIAGNOSIS — K219 Gastro-esophageal reflux disease without esophagitis: Secondary | ICD-10-CM | POA: Diagnosis not present

## 2016-01-30 DIAGNOSIS — R053 Chronic cough: Secondary | ICD-10-CM

## 2016-01-30 MED ORDER — TIOTROPIUM BROMIDE MONOHYDRATE 2.5 MCG/ACT IN AERS: 2.0000 | INHALATION_SPRAY | Freq: Every day | RESPIRATORY_TRACT | Status: DC

## 2016-01-30 MED ORDER — TIOTROPIUM BROMIDE MONOHYDRATE 1.25 MCG/ACT IN AERS: 2.0000 | INHALATION_SPRAY | Freq: Every day | RESPIRATORY_TRACT | Status: DC
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
## 2016-01-30 NOTE — Patient Instructions (Addendum)
1.  You were given a sample of Spiriva today.  Please fill the medication when you are able.  Refills were sent to your pharmacy.  If you decide this is too expensive, please call us to let us know and we can arrange for a nebulized version for you to do at home.    2.  Follow up with your primary care provider regarding your back pain.    3. Please take Allegra or Claritin for your seasonal allergies but not both.    4.  Return to see Dr. Lamonte Sakai in 3 months or sooner if new needs arise.

## 2016-01-30 NOTE — Addendum Note (Signed)
Addended by: Parke Poisson E on: 01/30/2016 02:13 PM   Modules accepted: Orders

## 2016-02-28 ENCOUNTER — Other Ambulatory Visit (INDEPENDENT_AMBULATORY_CARE_PROVIDER_SITE_OTHER): Payer: Medicare Other

## 2016-02-28 ENCOUNTER — Ambulatory Visit (INDEPENDENT_AMBULATORY_CARE_PROVIDER_SITE_OTHER)
Admission: RE | Admit: 2016-02-28 | Discharge: 2016-02-28 | Disposition: A | Discharge status: Home / Self Care | Payer: Medicare Other | Source: Ambulatory Visit | Attending: Acute Care | Admitting: Acute Care

## 2016-02-28 ENCOUNTER — Encounter: Payer: Self-pay | Admitting: Acute Care

## 2016-02-28 ENCOUNTER — Ambulatory Visit (INDEPENDENT_AMBULATORY_CARE_PROVIDER_SITE_OTHER): Payer: Medicare Other | Admitting: Acute Care

## 2016-02-28 VITALS — BP 144/92 | HR 97 | Ht 60.0 in | Wt 177.8 lb

## 2016-02-28 DIAGNOSIS — R109 Unspecified abdominal pain: Secondary | ICD-10-CM | POA: Diagnosis not present

## 2016-02-28 DIAGNOSIS — J438 Other emphysema: Secondary | ICD-10-CM

## 2016-02-28 DIAGNOSIS — R634 Abnormal weight loss: Secondary | ICD-10-CM

## 2016-02-28 DIAGNOSIS — R072 Precordial pain: Secondary | ICD-10-CM | POA: Diagnosis not present

## 2016-02-28 DIAGNOSIS — R079 Chest pain, unspecified: Secondary | ICD-10-CM

## 2016-02-28 DIAGNOSIS — I200 Unstable angina: Secondary | ICD-10-CM

## 2016-02-28 LAB — CBC WITH DIFFERENTIAL/PLATELET
BASOS ABS: 0 10*3/uL (ref 0.0–0.1)
Basophils Relative: 0.3 % (ref 0.0–3.0)
EOS ABS: 0.3 10*3/uL (ref 0.0–0.7)
EOS PCT: 2.6 % (ref 0.0–5.0)
HCT: 42.8 % (ref 39.0–52.0)
Hemoglobin: 14.1 g/dL (ref 13.0–17.0)
LYMPHS ABS: 1.6 10*3/uL (ref 0.7–4.0)
LYMPHS PCT: 12.1 % (ref 12.0–46.0)
MCHC: 33 g/dL (ref 30.0–36.0)
MCV: 85.9 fl (ref 78.0–100.0)
MONO ABS: 0.7 10*3/uL (ref 0.1–1.0)
MONOS PCT: 5 % (ref 3.0–12.0)
NEUTROS ABS: 10.6 10*3/uL — AB (ref 1.4–7.7)
NEUTROS PCT: 80 % — AB (ref 43.0–77.0)
PLATELETS: 177 10*3/uL (ref 150.0–400.0)
RBC: 4.98 Mil/uL (ref 4.22–5.81)
RDW: 15.1 % (ref 11.5–15.5)
WBC: 13.3 10*3/uL — AB (ref 4.0–10.5)

## 2016-02-28 LAB — BASIC METABOLIC PANEL
BUN: 11 mg/dL (ref 6–23)
CHLORIDE: 101 meq/L (ref 96–112)
CO2: 31 meq/L (ref 19–32)
Calcium: 10.1 mg/dL (ref 8.4–10.5)
Creatinine, Ser: 1.1 mg/dL (ref 0.40–1.50)
GFR: 86.86 mL/min (ref >60.00)
Glucose, Bld: 97 mg/dL (ref 70–99)
Potassium: 3.9 mEq/L (ref 3.5–5.1)
Sodium: 139 mEq/L (ref 135–145)

## 2016-02-28 NOTE — Assessment & Plan Note (Addendum)
Non-specific abdominal pain Lack of Appetite 5 lb weight loss in less than 1 month  Plan: GI Referral Boost diet supplement  Continue Protonix daily as he has been doing
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
## 2016-02-28 NOTE — Assessment & Plan Note (Addendum)
Non-specific sensation in lower abdomen/diaphragm area with deep inspiration No fever, no chest pain, no worsening cough. Slight worsening in shortness of breath with exertion.  Plan: We will do a CXR today( Reviewed personally by me: no acute disease) We will do an EKG today ( reviewed by Me and Dr. Lamonte Sakai, no change from previous EKG) We will check lab work today ( CBC, BMET ) We will call you with results. Continue taking your Spiriva 2 puffs once daily Continue your Claritin once daily Continue your Flonase spray 2 sprays into both nostrils daily Follow up as needed if you are not feeling better. Follow up with Dr. Lamonte Sakai in June as is already scheduled. Please contact office for sooner follow up if symptoms do not improve or worsen or seek emergency care

## 2016-02-28 NOTE — Patient Instructions (Addendum)
It is nice to meet you today. We will do a CXR today We will do an EKG today  We will check lab work today ( CBC, BMET ) We will call you with results. GI referral for non-specific abdominal pain and loss of appetite/ 5 lb weigh loss in 1 month Try using Boost/ Ensure diet supplement ( You can use the generic) Follow up as needed if you are not feeling better. Please contact office for sooner follow up if symptoms do not improve or worsen or seek emergency care

## 2016-02-28 NOTE — Assessment & Plan Note (Signed)
Very unspecified abdominal " lung" pain With history of angina and cardiac history, will do EKG  Plan: EKG today Comparison to last EKG no significant change.

## 2016-03-08 ENCOUNTER — Encounter: Payer: Self-pay | Admitting: *Deleted

## 2016-03-12 ENCOUNTER — Ambulatory Visit: Payer: Managed Care, Other (non HMO) | Admitting: Physician Assistant

## 2016-03-15 ENCOUNTER — Encounter: Payer: Self-pay | Admitting: Physician Assistant

## 2016-03-15 ENCOUNTER — Ambulatory Visit (INDEPENDENT_AMBULATORY_CARE_PROVIDER_SITE_OTHER): Payer: Medicare Other | Admitting: Physician Assistant

## 2016-03-15 ENCOUNTER — Other Ambulatory Visit (INDEPENDENT_AMBULATORY_CARE_PROVIDER_SITE_OTHER): Payer: Medicare Other

## 2016-03-15 VITALS — BP 124/72 | HR 82 | Ht 62.75 in | Wt 178.2 lb

## 2016-03-15 DIAGNOSIS — R634 Abnormal weight loss: Secondary | ICD-10-CM | POA: Diagnosis not present

## 2016-03-15 DIAGNOSIS — R1084 Generalized abdominal pain: Secondary | ICD-10-CM | POA: Diagnosis not present

## 2016-03-15 LAB — HEPATIC FUNCTION PANEL
ALT: 29 U/L (ref 0–53)
AST: 16 U/L (ref 0–37)
Albumin: 4.4 g/dL (ref 3.5–5.2)
Alkaline Phosphatase: 101 U/L (ref 39–117)
BILIRUBIN DIRECT: 0.2 mg/dL (ref 0.0–0.3)
Total Bilirubin: 0.7 mg/dL (ref 0.2–1.2)
Total Protein: 7.6 g/dL (ref 6.0–8.3)

## 2016-03-15 LAB — CBC WITH DIFFERENTIAL/PLATELET
BASOS ABS: 0 10*3/uL (ref 0.0–0.1)
Basophils Relative: 0 % (ref 0.0–3.0)
Eosinophils Absolute: 0.6 10*3/uL (ref 0.0–0.7)
Eosinophils Relative: 4.1 % (ref 0.0–5.0)
HCT: 43.7 % (ref 39.0–52.0)
Hemoglobin: 14.6 g/dL (ref 13.0–17.0)
LYMPHS ABS: 1.4 10*3/uL (ref 0.7–4.0)
Lymphocytes Relative: 10.3 % — ABNORMAL LOW (ref 12.0–46.0)
MCHC: 33.5 g/dL (ref 30.0–36.0)
MCV: 85.5 fl (ref 78.0–100.0)
MONO ABS: 0.7 10*3/uL (ref 0.1–1.0)
Monocytes Relative: 4.7 % (ref 3.0–12.0)
NEUTROS PCT: 80.9 % — AB (ref 43.0–77.0)
Neutro Abs: 11.3 10*3/uL — ABNORMAL HIGH (ref 1.4–7.7)
Platelets: 192 10*3/uL (ref 150.0–400.0)
RBC: 5.11 Mil/uL (ref 4.22–5.81)
RDW: 15.4 % (ref 11.5–15.5)
WBC: 14 10*3/uL — ABNORMAL HIGH (ref 4.0–10.5)

## 2016-03-15 LAB — LIPASE: LIPASE: 32 U/L (ref 11.0–59.0)

## 2016-03-15 NOTE — Patient Instructions (Addendum)
Please go to the basement level to have your labs drawn.  Continue the Pantoprazole sodium 40 mg , 1 tablet every morning.  You have been scheduled for a CT scan of the abdomen and pelvis at [facility] ([address]---this is in the same building as Press [facility]).   You are scheduled on 03-20-2016 at 9:00 am. You should arrive at 8:45 am  to your appointment time for registration. Please follow the written instructions below on the day of your exam:  WARNING: IF YOU ARE ALLERGIC TO IODINE/X-RAY DYE, PLEASE NOTIFY RADIOLOGY IMMEDIATELY AT [phone]! YOU WILL BE GIVEN A 13 HOUR PREMEDICATION PREP.  1) Do not eat or drink anything after 5:00 am (4 hours prior to your test) 2) You have been given 2 bottles of oral contrast to drink. The solution may taste  better if refrigerated, but do NOT add ice or any other liquid to this solution. Shake well before drinking.    Drink 1 bottle of contrast @ 7:00 am (2 hours prior to your exam)  Drink 1 bottle of contrast @ 8:00 am  (1 hour prior to your exam)  You may take any medications as prescribed with a small amount of water except for the following: Metformin, Glucophage, Glucovance, Avandamet, Riomet, Fortamet, Actoplus Met, Janumet, Glumetza or Metaglip. The above medications must be held the day of the exam AND 48 hours after the exam.  The purpose of you drinking the oral contrast is to aid in the visualization of your intestinal tract. The contrast solution may cause some diarrhea. Before your exam is started, you will be given a small amount of fluid to drink. Depending on your individual set of symptoms, you may also receive an intravenous injection of x-ray contrast/dye. Plan on being at [facility] for 30 minutes or longer, depending on the type of exam you are having performed.  This test typically takes 30-45 minutes to complete.  If you have any questions regarding your exam or if you need to reschedule, you may  call the CT department at [phone] between the hours of 8:00 am and 5:00 pm, Monday-Friday.  ________________________________________________________________________                  If you are age 64 or younger, your body mass index should be between 19-25. Your Body mass index is 31.81 kg/(m^2). If this is out of the aformentioned range listed, please consider follow up with your Primary Care Provider.

## 2016-03-15 NOTE — Progress Notes (Addendum)
Patient ID: George Robbins, male   DOB: 16-Mar-1951, 65 y.o.   MRN: [id number]   Subjective:    Patient ID: George Robbins, male    DOB: Dec 06, 1950, 65 y.o.   MRN: [id number]  HPI  Derian is a pleasant 65 year old African-American male known to Dr.Pyrtle. He had been seen in January 2015 and had undergone colonoscopy. He had a 10 mm polyp in the cecum and a 4 mm polyp in the ascending colon both removed and consistent with tubular adenomas. Exam otherwise negative. He comes in today referred  By his PCP/Valerie Feliciana Rossetti  NP for complaints of generalized abdominal discomfort and weight loss.  Patient has history of COPD, chronic kidney disease, coronary artery disease and is maintained on Plavix. He is a poor historian but feels that he has lost 5 or 6 pounds over the past 5 weeks. His appetite has been decreased is not had any nausea or vomiting but says he has been coughing up a lot of mucus. When asked about abdominal pain he describes a burning sensation throughout his abdomen and also complains of some burning sensation across his back. He has intermittent heartburn and indigestion no dysphagia or odynophagia. His bowel movements have been regular and he has not had any melena or hematochezia. He denies any NSAID use. Patient is alert and asked that I speak with his sister about any details. Most recent labs 02/28/2016 showed a CBC of 13.3 hemoglobin 14 hematocrit of 42.8.  Review of Systems Pertinent positive and negative review of systems were noted in the above HPI section.  All other review of systems was otherwise negative.  Outpatient Encounter Prescriptions as of 03/15/2016  Medication Sig  . acetaminophen (TYLENOL) 325 MG tablet Take 650 mg by mouth every 6 (six) hours as needed for moderate pain.  Marland Kitchen albuterol (PROVENTIL HFA;VENTOLIN HFA) 108 (90 Base) MCG/ACT inhaler Inhale 2 puffs into the lungs every 6 (six) hours as needed for wheezing or shortness of breath. For cough  . aspirin  EC 81 MG tablet Take 1 tablet (81 mg total) by mouth daily.  Marland Kitchen atorvastatin (LIPITOR) 40 MG tablet Take 1 tablet (40 mg total) by mouth daily.  . carvedilol (COREG) 6.25 MG tablet Take 1 tablet (6.25 mg total) by mouth 2 (two) times daily with a meal.  . clopidogrel (PLAVIX) 75 MG tablet take 1 tablet by mouth once daily WITH BREAKFAST  . fluticasone (FLONASE) 50 MCG/ACT nasal spray Place 2 sprays into both nostrils daily. For allergies  . levothyroxine (SYNTHROID, LEVOTHROID) 100 MCG tablet TAKE 1 TABLET BY MOUTH ONCE A DAY BEFORE BREAKFAST  . loratadine (CLARITIN) 10 MG tablet Take 1 tablet (10 mg total) by mouth daily.  . Multiple Vitamin (MULTIVITAMIN) tablet Take 1 tablet by mouth daily.  . nitroGLYCERIN (NITROSTAT) 0.4 MG SL tablet Place 1 tablet (0.4 mg total) under the tongue every 5 (five) minutes as needed for chest pain.  . pantoprazole (PROTONIX) 40 MG tablet Take 30- 60 min before your first and last meals of the day (Patient taking differently: Take 40 mg by mouth 2 (two) times daily. Take 30- 60 min before your first and last meals of the day)  . Tiotropium Bromide Monohydrate (SPIRIVA RESPIMAT) 2.5 MCG/ACT AERS Inhale 2 puffs into the lungs daily.   No facility-administered encounter medications on file as of 03/15/2016.   Allergies  Allergen Reactions  . Simvastatin     Rash    Patient Active Problem List   Diagnosis Date  Noted  . Loss of weight 02/28/2016  . Chest pain 12/07/2015  . Obesity 08/06/2015  . Chest pain with moderate risk for cardiac etiology 08/10/2014  . COPD (chronic obstructive pulmonary disease) ([place]) 05/06/2014  . Hyperlipidemia 04/01/2014  . Unstable angina ([place]) 03/31/2014  . Post-nasal drip 03/30/2014  . Bradycardia 03/30/2014  . CAD (coronary artery disease) 03/29/2014  . Chronic cough 03/01/2014  . GERD (gastroesophageal reflux disease) 11/30/2013  . DOE (dyspnea on exertion) 10/28/2013  . CKD (chronic kidney disease) stage 2, GFR 60-89  ml/min 10/08/2013  . HTN (hypertension) 10/08/2013  . Unspecified hypothyroidism- TSH 88 10/08/2013  . Colitis - presumed infectious origin 01/24/2012  . Syncope and collapse 01/24/2012  . Renal failure 01/24/2012   Social History   Social History  . Marital Status: Single    Spouse Name: N/A  . Number of Children: 2  . Years of Education: N/A   Occupational History  . Unemployed    Social History Main Topics  . Smoking status: Former Smoker -- 1.00 packs/day for 30 years    Types: Cigarettes    Quit date: 04/02/2004  . Smokeless tobacco: Never Used  . Alcohol Use: No     Comment: former  . Drug Use: No  . Sexual Activity: No   Other Topics Concern  . Not on file   Social History Narrative   Lives with his mother.    Mr. Hoeg family history includes Colon cancer in his mother; Diabetes type II in his sister; Heart attack in his mother; Heart disease in his father; Hypertension in his brother.      Objective:    Filed Vitals:   03/15/16 1027  BP: 124/72  Pulse: 82    Physical Exam  well-developed older African-American male in no acute distress, pleasant blood pressure 124/72 pulse 82 height 5 foot 2 weight 178. HEENT; nontraumatic normocephalic EOMI PERRLA sclera anicteric, Cardiovascular; regular rate and rhythm with S1-S2 no murmur or gallop, Pulmonary ;clear bilaterally, Abdomen; soft is no focal tenderness no guarding or rebound no palpable mass or hepatosplenomegaly, rectal; exam not done, Extremities; no clubbing cyanosis or edema skin warm and dry, Neuropsych; mood and affect appropriate     Assessment & Plan:   #17 65 year old African-American male with complaints of generalized abdominal discomfort and burning and a 5-6 pound weight loss over the past month or so. Etiology of symptoms is not clear #2 history of adenomatous colon polyps, last colonoscopy January 2015, due for follow-up January 2020 #3 chronic kidney disease #4 COPD #5 coronary  artery disease #6 chronic antiplatelet therapy-on Plavix   Plan; Will repeat CBC with differential, check CMET,lipase Schedule for CT scan of the abdomen and pelvis with contrast Continue Protonix 40 mg by mouth every morning Consider EGD if CT unrevealing. Patient will be scheduled for office follow-up with Dr.Pyrtle.   Kaitlynne Wenz S Ahnyla Mendel PA-C 03/15/2016   Cc: Lance Bosch, NP  Addendum: Reviewed and agree with initial management. It should be noted that recall surveillance colonoscopy is due January 2018 which would be a 3 year surveillance interval based on 10 mm polyp removed from the cecum in 2015. Current recall in our system is for January 2018 which is correct   Jerene Bears, MD

## 2016-03-20 ENCOUNTER — Ambulatory Visit (INDEPENDENT_AMBULATORY_CARE_PROVIDER_SITE_OTHER)
Admission: RE | Admit: 2016-03-20 | Discharge: 2016-03-20 | Disposition: A | Discharge status: Home / Self Care | Payer: Medicare Other | Source: Ambulatory Visit | Attending: Physician Assistant | Admitting: Physician Assistant

## 2016-03-20 DIAGNOSIS — R1084 Generalized abdominal pain: Secondary | ICD-10-CM | POA: Diagnosis not present

## 2016-03-20 DIAGNOSIS — R634 Abnormal weight loss: Secondary | ICD-10-CM | POA: Diagnosis not present

## 2016-03-20 MED ORDER — IOPAMIDOL (ISOVUE-300) INJECTION 61%
100.0000 mL | Freq: Once | INTRAVENOUS | Status: AC | PRN
  Administered 2016-03-20: 100 mL via INTRAVENOUS

## 2016-03-23 ENCOUNTER — Telehealth: Payer: Self-pay

## 2016-03-23 NOTE — Telephone Encounter (Signed)
-----   Message from [name], MD sent at 03/23/2016  2:43 PM EDT ----- I have not seen this patient in 1 1/2 years.  It has certainly been long enough since his PCI to stop the plavix for 5 days.   ----- Message -----    From: Greggory Keen, LPN    Sent: [id number]  10:56 AM      To: [name], MD  Please advise. Dr Hilarie Fredrickson  Will be doing a colonoscopy on this patient. Mr Gerke is on Plavix. Can this patient hold the Plavix for 7 days prior to the colonoscopy? Thank you

## 2016-04-09 ENCOUNTER — Ambulatory Visit (AMBULATORY_SURGERY_CENTER): Payer: Self-pay

## 2016-04-09 VITALS — Ht 63.5 in | Wt 180.8 lb

## 2016-04-09 DIAGNOSIS — R1084 Generalized abdominal pain: Secondary | ICD-10-CM

## 2016-04-09 MED ORDER — SUPREP BOWEL PREP KIT 17.5-3.13-1.6 GM/177ML PO SOLN: 1.0000 | Freq: Once | ORAL | Status: DC

## 2016-04-09 NOTE — Progress Notes (Signed)
No allergies to eggs or soy No diet meds No home oxygen No past problems with anesthesia  No internet 

## 2016-04-16 ENCOUNTER — Encounter: Payer: Medicare Other | Admitting: Internal Medicine

## 2016-04-19 ENCOUNTER — Encounter: Payer: Self-pay | Admitting: Internal Medicine

## 2016-04-19 ENCOUNTER — Ambulatory Visit (AMBULATORY_SURGERY_CENTER): Payer: Medicare Other | Admitting: Internal Medicine

## 2016-04-19 VITALS — BP 138/87 | HR 88 | Temp 96.0°F | Resp 19 | Ht 63.0 in | Wt 180.0 lb

## 2016-04-19 DIAGNOSIS — I2510 Atherosclerotic heart disease of native coronary artery without angina pectoris: Secondary | ICD-10-CM | POA: Diagnosis not present

## 2016-04-19 DIAGNOSIS — J449 Chronic obstructive pulmonary disease, unspecified: Secondary | ICD-10-CM | POA: Diagnosis not present

## 2016-04-19 DIAGNOSIS — I10 Essential (primary) hypertension: Secondary | ICD-10-CM | POA: Diagnosis not present

## 2016-04-19 DIAGNOSIS — R1084 Generalized abdominal pain: Secondary | ICD-10-CM

## 2016-04-19 DIAGNOSIS — N183 Chronic kidney disease, stage 3 (moderate): Secondary | ICD-10-CM | POA: Diagnosis not present

## 2016-04-19 DIAGNOSIS — K219 Gastro-esophageal reflux disease without esophagitis: Secondary | ICD-10-CM | POA: Diagnosis not present

## 2016-04-19 DIAGNOSIS — Z860100 Personal history of colon polyps, unspecified: Secondary | ICD-10-CM

## 2016-04-19 DIAGNOSIS — R109 Unspecified abdominal pain: Secondary | ICD-10-CM | POA: Diagnosis not present

## 2016-04-19 DIAGNOSIS — K529 Noninfective gastroenteritis and colitis, unspecified: Secondary | ICD-10-CM | POA: Diagnosis not present

## 2016-04-19 DIAGNOSIS — K635 Polyp of colon: Secondary | ICD-10-CM | POA: Diagnosis not present

## 2016-04-19 DIAGNOSIS — R634 Abnormal weight loss: Secondary | ICD-10-CM | POA: Diagnosis not present

## 2016-04-19 MED ORDER — SODIUM CHLORIDE 0.9 % IV SOLN: 500.0000 mL | INTRAVENOUS | Status: DC

## 2016-04-19 NOTE — Progress Notes (Signed)
Stable to RR 

## 2016-04-19 NOTE — Op Note (Signed)
[facility] Patient Name: George Robbins Procedure Date: 04/19/2016 11:49 AM MRN: [id number] Endoscopist: Jerene Bears , MD Age: 65 Referring MD:  Date of Birth: 1951-11-21 Gender: Male Procedure:                Colonoscopy Indications:              Generalized abdominal pain, Personal history of                            colonic polyps, Weight loss Medicines:                Monitored Anesthesia Care Procedure:                Pre-Anesthesia Assessment:                           - Prior to the procedure, a History and Physical                            was performed, and patient medications and                            allergies were reviewed. The patient's tolerance of                            previous anesthesia was also reviewed. The risks                            and benefits of the procedure and the sedation                            options and risks were discussed with the patient.                            All questions were answered, and informed consent                            was obtained. Prior Anticoagulants: The patient has                            taken Plavix (clopidogrel), last dose was 1 day                            prior to procedure. ASA Grade Assessment: III - A                            patient with severe systemic disease. After                            reviewing the risks and benefits, the patient was                            deemed in satisfactory condition to undergo the  procedure.                           After obtaining informed consent, the colonoscope                            was passed under direct vision. Throughout the                            procedure, the patient's blood pressure, pulse, and                            oxygen saturations were monitored continuously. The                            Model CF-HQ190L [id number]) scope was introduced                            through the  anus and advanced to the the cecum,                            identified by appendiceal orifice and ileocecal                            valve. The colonoscopy was performed without                            difficulty. The patient tolerated the procedure                            well. The quality of the bowel preparation was                            excellent. The ileocecal valve, appendiceal                            orifice, and rectum were photographed. Scope In: 11:59:32 AM Scope Out: 12:07:44 PM Scope Withdrawal Time: 0 hours 6 minutes 46 seconds  Total Procedure Duration: 0 hours 8 minutes 12 seconds  Findings:                 The digital rectal exam was normal.                           A 12 mm polyp was found in the cecum. The polyp was                            sessile. Polypectomy was not attempted due to the                            patient taking anticoagulation medication.                           A few small-mouthed diverticula were found in the  sigmoid colon.                           Non-bleeding internal hemorrhoids were found during                            retroflexion. The hemorrhoids were small. Complications:            No immediate complications. Estimated Blood Loss:     Estimated blood loss: none. Impression:               - One 12 mm polyp in the cecum. Resection not                            attempted.                           - Diverticulosis in the sigmoid colon.                           - Non-bleeding internal hemorrhoids.                           - No specimens collected. Recommendation:           - Patient has a contact number available for                            emergencies. The signs and symptoms of potential                            delayed complications were discussed with the                            patient. Return to normal activities tomorrow.                            Written discharge  instructions were provided to the                            patient.                           - Resume previous diet.                           - Continue present medications.                           - Arrange colonoscopy for polypectomy and EGD (to                            further evaluate weight loss and abdominal pain)                            next available at a time when Plavix has been held  5 days prior to procedures (permission already                            obtained from cardiology).                           - Repeat colonoscopy at appointment to be scheduled. Jerene Bears, MD 04/19/2016 12:12:37 PM This report has been signed electronically.

## 2016-04-19 NOTE — Patient Instructions (Signed)
YOU HAD AN ENDOSCOPIC PROCEDURE TODAY AT THE [facility]:   Refer to the procedure report that was given to you for any specific questions about what was found during the examination.  If the procedure report does not answer your questions, please call your gastroenterologist to clarify.  If you requested that your care partner not be given the details of your procedure findings, then the procedure report has been included in a sealed envelope for you to review at your convenience later.  YOU SHOULD EXPECT: Some feelings of bloating in the abdomen. Passage of more gas than usual.  Walking can help get rid of the air that was put into your GI tract during the procedure and reduce the bloating. If you had a lower endoscopy (such as a colonoscopy or flexible sigmoidoscopy) you may notice spotting of blood in your stool or on the toilet paper. If you underwent a bowel prep for your procedure, you may not have a normal bowel movement for a few days.  Please Note:  You might notice some irritation and congestion in your nose or some drainage.  This is from the oxygen used during your procedure.  There is no need for concern and it should clear up in a day or so.  SYMPTOMS TO REPORT IMMEDIATELY:   Following lower endoscopy (colonoscopy or flexible sigmoidoscopy):  Excessive amounts of blood in the stool  Significant tenderness or worsening of abdominal pains  Swelling of the abdomen that is new, acute  Fever of 100F or higher   For urgent or emergent issues, a gastroenterologist can be reached at any hour by calling [phone].   DIET: Your first meal following the procedure should be a small meal and then it is ok to progress to your normal diet. Heavy or fried foods are harder to digest and may make you feel nauseous or bloated.  Likewise, meals heavy in dairy and vegetables can increase bloating.  Drink plenty of fluids but you should avoid alcoholic beverages for 24  hours.  ACTIVITY:  You should plan to take it easy for the rest of today and you should NOT DRIVE or use heavy machinery until tomorrow (because of the sedation medicines used during the test).    FOLLOW UP: Our staff will call the number listed on your records the next business day following your procedure to check on you and address any questions or concerns that you may have regarding the information given to you following your procedure. If we do not reach you, we will leave a message.  However, if you are feeling well and you are not experiencing any problems, there is no need to return our call.  We will assume that you have returned to your regular daily activities without incident.  If any biopsies were taken you will be contacted by phone or by letter within the next 1-3 weeks.  Please call us at [phone] if you have not heard about the biopsies in 3 weeks.    SIGNATURES/CONFIDENTIALITY: You and/or your care partner have signed paperwork which will be entered into your electronic medical record.  These signatures attest to the fact that that the information above on your After Visit Summary has been reviewed and is understood.  Full responsibility of the confidentiality of this discharge information lies with you and/or your care-partner. 

## 2016-04-20 ENCOUNTER — Telehealth: Payer: Self-pay | Admitting: *Deleted

## 2016-04-20 NOTE — Telephone Encounter (Signed)
  Follow up Call-  Call back number 04/19/2016 12/02/2013  Post procedure Call Back phone  # [phone]  Permission to leave phone message Yes Yes  comments - pt does not have answering machine     Patient questions:  Do you have a fever, pain , or abdominal swelling? No. Pain Score  0 *  Have you tolerated food without any problems? No.  Have you been able to return to your normal activities? Yes.    Do you have any questions about your discharge instructions: Diet   No. Medications  No. Follow up visit  No.  Do you have questions or concerns about your Care? No.  Actions: * If pain score is 4 or above: No action needed, pain <4.

## 2016-05-02 NOTE — Assessment & Plan Note (Signed)
Suspect contributions of chronic LPR, GERD. Modified barium study supports this as well as possible mild dysphagia. Swallowing precautions reviewed with him.

## 2016-05-02 NOTE — Assessment & Plan Note (Signed)
Stable on current BD regimen.

## 2016-05-14 ENCOUNTER — Ambulatory Visit (INDEPENDENT_AMBULATORY_CARE_PROVIDER_SITE_OTHER): Payer: Medicare Other | Admitting: Emergency Medicine

## 2016-05-14 ENCOUNTER — Encounter: Payer: Self-pay | Admitting: Emergency Medicine

## 2016-05-14 VITALS — BP 118/86 | HR 76 | Wt 183.0 lb

## 2016-05-14 DIAGNOSIS — I200 Unstable angina: Secondary | ICD-10-CM

## 2016-05-14 DIAGNOSIS — J438 Other emphysema: Secondary | ICD-10-CM | POA: Diagnosis not present

## 2016-05-14 MED ORDER — TIOTROPIUM BROMIDE MONOHYDRATE 18 MCG IN CAPS: 18.0000 ug | ORAL_CAPSULE | Freq: Every day | RESPIRATORY_TRACT | Status: DC

## 2016-05-14 NOTE — Patient Instructions (Addendum)
We need to get you back on an every-day inhaler mediation. Please start Spiriva Handihaler once a day Take albuterol 2 puffs up to every 4 hours if needed for shortness of breath.  Follow with Dr Lamonte Sakai in 4-6 weeks. At that time we will check to see if you have benefited from the medication. We will also check your oxygen level while walking once you're on the medicine

## 2016-05-14 NOTE — Assessment & Plan Note (Signed)
Difficult to get a good history but clearly his dyspnea has been worse over the last several months. This coincides with the time during which he's been unable to get his long-acting inhaled medication. I had changed him to Spiriva Respimat in an attempt to help with cough. His insurance will not cover. I would like to change him back to Spiriva HandiHaler for maintenance therapy. Once he is back on this medication I will follow with him, see if he has benefited, also perform a walking oximetry to see if he desaturates.

## 2016-05-14 NOTE — Progress Notes (Signed)
Subjective:    Patient ID: George Robbins, male    DOB: 1951/02/01, 65 y.o.   MRN: [id number]  Cough Associated symptoms include postnasal drip, rhinorrhea and shortness of breath.   65 yo man, former smoker (30 pk-yrs), history of CAD s/p PTCI, GERD, hypertension, hypothyroidism, chronic kidney disease, prior traumatic pneumothorax. He has a history of chronic cough. Also noted to have mixed obstruction and restriction on pulmonary function testing  ROV 06/10/15 -- follow-up visit for mixed obstruction and restriction, chronic cough. He has been maintained on Spiriva and albuterol as needed. Since our last visit he underwent a modified barium swallow and I personally reviewed the results today. He had minimal to mild pharyngeal dysphagia and a delayed swallow initiation without any obvious laryngeal penetration or aspiration. He was started on swallowing and reflux precautions and a normal diet. His cough is a bit better but still happens w meals. He has been reliable with his Spiriva.  Needs refill on albuterol and nasal spray  ROV 10/25/15 -- follow-up visit for chronic cough, history of traumatic pneumothorax, GERD, CAD. He has mixed disease on spirometry from 06/09/14 that I personally reviewed today. There has been some question of delayed swallow in the past but no obvious aspiration on barium swallow. He has had flaring of his chronic cough in September and was seen by Dr Melvyn Novas and TP.  A trial off his Spiriva was performed at that time. He is having some increased SOB, especially up hills. He states that he is taking his loratadine, fluticasone nasal spray. He is on pantoprazole once a day.  He uses albuterol many mornings. His cough is certainly less than last visit.   ROV 05/14/16 -- Patient has a history of chronic cough, chronic traumatic pneumothorax, GERD, hypertension, coronary disease. He has mixed obstruction and restriction on pulmonary function testing. He's been seen twice in our  office since my last visit with him. On one occasion he had been unable to obtain his Spiriva. He was seen in April for a discomfort in his chest that was making it difficult for him to take a deep breath. He had CT scan of his abdomen on 03/20/16 that showed no intra-abdominal abnormality but some left lower lobe atelectasis. He is on protonix bid, flonase. He tells me today that he has been feeling bad > inability to take a deep breath, has has to stop with hills and extended walking, taking trash to road. He has albuterol that he is using qd. He has been off spiriva since April sample ran out. He also has run out of synthroid and atorvastatin.     No flowsheet data found.   Review of Systems  HENT: Positive for postnasal drip and rhinorrhea.   Respiratory: Positive for cough and shortness of breath.        Objective:   Physical Exam  Filed Vitals:   05/14/16 1133  BP: 118/86  Pulse: 76  Weight: 183 lb (83.008 kg)  SpO2: 94%   Gen: Pleasant, well-nourished, in no distress,  normal affect  ENT: No lesions,  mouth clear,  oropharynx clear, no postnasal drip  Neck: No JVD, no TMG, no carotid bruits  Lungs: No use of accessory muscles, clear without rales or rhonchi  Cardiovascular: RRR, heart sounds normal, no murmur or gallops, no peripheral edema  Musculoskeletal: No deformities, no cyanosis or clubbing  Neuro: alert, non focal  Skin: Warm, no lesions or rashes       Assessment &  Plan:  COPD (chronic obstructive pulmonary disease) ([place]) Difficult to get a good history but clearly his dyspnea has been worse over the last several months. This coincides with the time during which he's been unable to get his long-acting inhaled medication. I had changed him to Spiriva Respimat in an attempt to help with cough. His insurance will not cover. I would like to change him back to Spiriva HandiHaler for maintenance therapy. Once he is back on this medication I will follow with him, see  if he has benefited, also perform a walking oximetry to see if he desaturates.   Baltazar Apo, MD, PhD 05/14/2016, 11:52 AM  Pulmonary and Critical Care [phone] or if no answer [phone]

## 2016-05-31 ENCOUNTER — Ambulatory Visit (AMBULATORY_SURGERY_CENTER): Payer: Self-pay

## 2016-05-31 VITALS — Ht 64.0 in | Wt 191.0 lb

## 2016-05-31 DIAGNOSIS — R634 Abnormal weight loss: Secondary | ICD-10-CM

## 2016-05-31 DIAGNOSIS — R1084 Generalized abdominal pain: Secondary | ICD-10-CM

## 2016-05-31 NOTE — Progress Notes (Signed)
No allergies to eggs or soy No past problems with anesthesia No home oxygen No diet meds  No internet 

## 2016-06-04 ENCOUNTER — Telehealth: Payer: Self-pay | Admitting: Internal Medicine

## 2016-06-04 DIAGNOSIS — I10 Essential (primary) hypertension: Secondary | ICD-10-CM

## 2016-06-04 DIAGNOSIS — I2510 Atherosclerotic heart disease of native coronary artery without angina pectoris: Secondary | ICD-10-CM

## 2016-06-04 MED ORDER — CARVEDILOL 6.25 MG PO TABS: 6.2500 mg | ORAL_TABLET | Freq: Two times a day (BID) | ORAL | Status: DC

## 2016-06-04 MED ORDER — ATORVASTATIN CALCIUM 40 MG PO TABS: 40.0000 mg | ORAL_TABLET | Freq: Every day | ORAL | Status: DC

## 2016-06-04 MED ORDER — CLOPIDOGREL BISULFATE 75 MG PO TABS: ORAL_TABLET | ORAL | Status: DC

## 2016-06-04 NOTE — Telephone Encounter (Signed)
Refilled clopidogrel, carvedilol, and atorvastatin x 30 days - patient needs office visit for further refills.

## 2016-06-04 NOTE — Telephone Encounter (Signed)
Pt. Called requesting a refill on his blood thinner medication and his heart medication. Please f/u

## 2016-06-14 ENCOUNTER — Encounter: Payer: Self-pay | Admitting: Internal Medicine

## 2016-06-14 ENCOUNTER — Ambulatory Visit (AMBULATORY_SURGERY_CENTER): Payer: Medicare Other | Admitting: Internal Medicine

## 2016-06-14 VITALS — BP 130/80 | HR 79 | Temp 97.7°F | Resp 14 | Ht 64.0 in | Wt 191.0 lb

## 2016-06-14 DIAGNOSIS — D124 Benign neoplasm of descending colon: Secondary | ICD-10-CM

## 2016-06-14 DIAGNOSIS — D125 Benign neoplasm of sigmoid colon: Secondary | ICD-10-CM

## 2016-06-14 DIAGNOSIS — K2950 Unspecified chronic gastritis without bleeding: Secondary | ICD-10-CM | POA: Diagnosis not present

## 2016-06-14 DIAGNOSIS — R634 Abnormal weight loss: Secondary | ICD-10-CM | POA: Diagnosis not present

## 2016-06-14 DIAGNOSIS — D122 Benign neoplasm of ascending colon: Secondary | ICD-10-CM | POA: Diagnosis not present

## 2016-06-14 DIAGNOSIS — R109 Unspecified abdominal pain: Secondary | ICD-10-CM | POA: Diagnosis not present

## 2016-06-14 DIAGNOSIS — I2510 Atherosclerotic heart disease of native coronary artery without angina pectoris: Secondary | ICD-10-CM | POA: Diagnosis not present

## 2016-06-14 DIAGNOSIS — R1084 Generalized abdominal pain: Secondary | ICD-10-CM | POA: Diagnosis not present

## 2016-06-14 DIAGNOSIS — K2970 Gastritis, unspecified, without bleeding: Secondary | ICD-10-CM | POA: Diagnosis not present

## 2016-06-14 DIAGNOSIS — D120 Benign neoplasm of cecum: Secondary | ICD-10-CM

## 2016-06-14 DIAGNOSIS — J449 Chronic obstructive pulmonary disease, unspecified: Secondary | ICD-10-CM | POA: Diagnosis not present

## 2016-06-14 DIAGNOSIS — I10 Essential (primary) hypertension: Secondary | ICD-10-CM | POA: Diagnosis not present

## 2016-06-14 DIAGNOSIS — K219 Gastro-esophageal reflux disease without esophagitis: Secondary | ICD-10-CM | POA: Diagnosis not present

## 2016-06-14 MED ORDER — SODIUM CHLORIDE 0.9 % IV SOLN: 500.0000 mL | INTRAVENOUS | Status: DC

## 2016-06-14 NOTE — Progress Notes (Signed)
Called to room to assist during endoscopic procedure.  Patient ID and intended procedure confirmed with present staff. Received instructions for my participation in the procedure from the performing physician.  

## 2016-06-14 NOTE — Op Note (Signed)
[facility] Patient Name: George Robbins Procedure Date: 06/14/2016 10:20 AM MRN: [id number] Endoscopist: Jerene Bears , MD Age: 65 Referring MD:  Date of Birth: [birth year] Gender: Male Account #: [id number] Procedure:                Colonoscopy Indications:              Therapeutic procedure for known colon polyp which                            was not removed at colonoscopy on 04/19/2016 due to                            anti-platelet therapy which was uninterupted Medicines:                Monitored Anesthesia Care Procedure:                Pre-Anesthesia Assessment:                           - Prior to the procedure, a History and Physical                            was performed, and patient medications and                            allergies were reviewed. The patient's tolerance of                            previous anesthesia was also reviewed. The risks                            and benefits of the procedure and the sedation                            options and risks were discussed with the patient.                            All questions were answered, and informed consent                            was obtained. Prior Anticoagulants: The patient has                            taken Plavix (clopidogrel), last dose was 5 days                            prior to procedure. ASA Grade Assessment: III - A                            patient with severe systemic disease. After                            reviewing the risks and benefits, the patient was  deemed in satisfactory condition to undergo the                            procedure.                           After obtaining informed consent, the colonoscope                            was passed under direct vision. Throughout the                            procedure, the patient's blood pressure, pulse, and                            oxygen saturations were monitored continuously.  The                            Model CF-HQ190L [id number]) scope was introduced                            through the anus and advanced to the the cecum,                            identified by appendiceal orifice and ileocecal                            valve. The colonoscopy was performed without                            difficulty. The patient tolerated the procedure                            well. The quality of the bowel preparation was                            good. The ileocecal valve, appendiceal orifice, and                            rectum were photographed. Scope In: 10:43:03 AM Scope Out: 11:02:45 AM Scope Withdrawal Time: 0 hours 17 minutes 42 seconds  Total Procedure Duration: 0 hours 19 minutes 42 seconds  Findings:                 The perianal and digital rectal examinations were                            normal.                           A 14 mm polyp was found in the cecum. The polyp was                            sessile. The polyp was removed with a hot snare.  Resection and retrieval were complete. To prevent                            bleeding after the polypectomy, one hemostatic clip                            was successfully placed.                           A 2 mm polyp was found in the ascending colon. The                            polyp was sessile. The polyp was removed with a                            cold biopsy forceps. Resection and retrieval were                            complete.                           Two sessile polyps were found in the sigmoid colon                            and descending colon. The polyps were 3 to 4 mm in                            size. These polyps were removed with a cold snare.                            Resection and retrieval were complete.                           The exam was otherwise without abnormality.                           Internal hemorrhoids were found during                             retroflexion. The hemorrhoids were small. Complications:            No immediate complications. Estimated Blood Loss:     Estimated blood loss: none. Impression:               - One 14 mm polyp in the cecum, removed with a hot                            snare. Resected and retrieved. Clip was placed.                           - One 2 mm polyp in the ascending colon, removed                            with a cold biopsy forceps. Resected and  retrieved.                           - Two 3 to 4 mm polyps in the sigmoid colon and in                            the descending colon, removed with a cold snare.                            Resected and retrieved.                           - The examination was otherwise normal.                           - Small internal hemorrhoids. Recommendation:           - Patient has a contact number available for                            emergencies. The signs and symptoms of potential                            delayed complications were discussed with the                            patient. Return to normal activities tomorrow.                            Written discharge instructions were provided to the                            patient.                           - Resume previous diet.                           - Continue present medications.                           - Resume Plavix (clopidogrel) at prior dose in 5                            days.                           - Await pathology results.                           - Repeat colonoscopy is recommended. The                            colonoscopy date will be determined after pathology                            results from today's exam become available  for                            review. Jerene Bears, MD 06/14/2016 11:15:06 AM This report has been signed electronically.

## 2016-06-14 NOTE — Progress Notes (Signed)
Report to PACU, RN, vss, BBS= Clear.  

## 2016-06-14 NOTE — Patient Instructions (Signed)

## 2016-06-14 NOTE — Op Note (Signed)
[facility] Patient Name: Keiland Current Procedure Date: 06/14/2016 10:20 AM MRN: [id number] Endoscopist: Jerene Bears , MD Age: 65 Referring MD:  Date of Birth: 1951-02-11 Gender: Male Account #: [id number] Procedure:                Upper GI endoscopy Indications:              Generalized abdominal pain, Weight loss Medicines:                Monitored Anesthesia Care Procedure:                Pre-Anesthesia Assessment:                           - Prior to the procedure, a History and Physical                            was performed, and patient medications and                            allergies were reviewed. The patient's tolerance of                            previous anesthesia was also reviewed. The risks                            and benefits of the procedure and the sedation                            options and risks were discussed with the patient.                            All questions were answered, and informed consent                            was obtained. Prior Anticoagulants: The patient has                            taken Plavix (clopidogrel), last dose was 5 days                            prior to procedure. ASA Grade Assessment: III - A                            patient with severe systemic disease. After                            reviewing the risks and benefits, the patient was                            deemed in satisfactory condition to undergo the                            procedure.  After obtaining informed consent, the endoscope was                            passed under direct vision. Throughout the                            procedure, the patient's blood pressure, pulse, and                            oxygen saturations were monitored continuously. The                            Model GIF-HQ190 [id number]) scope was introduced                            through the mouth, and advanced to the second  part                            of duodenum. The upper GI endoscopy was                            accomplished without difficulty. The patient                            tolerated the procedure well. Scope In: Scope Out: Findings:                 The examined esophagus was normal.                           Localized mildly erythematous mucosa without                            bleeding was found in the gastric antrum. Biopsies                            were taken with a cold forceps for histology and                            Helicobacter pylori testing.                           The cardia and gastric fundus were normal on                            retroflexion.                           The examined duodenum was normal. Complications:            No immediate complications. Estimated blood loss:                            None. Estimated Blood Loss:     Estimated blood loss was minimal. Impression:               - Normal  esophagus.                           - Erythematous mucosa in the antrum. Biopsied.                           - Normal examined duodenum. Recommendation:           - Patient has a contact number available for                            emergencies. The signs and symptoms of potential                            delayed complications were discussed with the                            patient. Return to normal activities tomorrow.                            Written discharge instructions were provided to the                            patient.                           - Resume previous diet.                           - Continue present medications.                           - Await pathology results.                           - Perform a colonoscopy today. Jerene Bears, MD 06/14/2016 11:09:57 AM This report has been signed electronically.

## 2016-06-15 ENCOUNTER — Telehealth: Payer: Self-pay | Admitting: *Deleted

## 2016-06-15 NOTE — Telephone Encounter (Signed)
  Follow up Call-  Call back number 06/14/2016 04/19/2016 12/02/2013  Post procedure Call Back phone  # [phone]  Permission to leave phone message Yes Yes Yes  comments - - pt does not have answering machine     Patient questions:  Do you have a fever, pain , or abdominal swelling? No. Pain Score  0 *  Have you tolerated food without any problems? Yes.    Have you been able to return to your normal activities? Yes.    Do you have any questions about your discharge instructions: Diet   No. Medications  No. Follow up visit  No.  Do you have questions or concerns about your Care? No.  Actions: * If pain score is 4 or above: No action needed, pain <4.

## 2016-06-19 ENCOUNTER — Encounter: Payer: Self-pay | Admitting: Internal Medicine

## 2016-06-19 ENCOUNTER — Ambulatory Visit: Payer: Medicare Other | Attending: Internal Medicine | Admitting: Internal Medicine

## 2016-06-19 VITALS — BP 138/93 | HR 63 | Temp 97.7°F | Resp 16 | Wt 192.8 lb

## 2016-06-19 DIAGNOSIS — N183 Chronic kidney disease, stage 3 (moderate): Secondary | ICD-10-CM | POA: Insufficient documentation

## 2016-06-19 DIAGNOSIS — I2510 Atherosclerotic heart disease of native coronary artery without angina pectoris: Secondary | ICD-10-CM

## 2016-06-19 DIAGNOSIS — Z1159 Encounter for screening for other viral diseases: Secondary | ICD-10-CM | POA: Diagnosis not present

## 2016-06-19 DIAGNOSIS — Z87891 Personal history of nicotine dependence: Secondary | ICD-10-CM | POA: Diagnosis not present

## 2016-06-19 DIAGNOSIS — E785 Hyperlipidemia, unspecified: Secondary | ICD-10-CM | POA: Diagnosis not present

## 2016-06-19 DIAGNOSIS — I10 Essential (primary) hypertension: Secondary | ICD-10-CM | POA: Diagnosis not present

## 2016-06-19 DIAGNOSIS — Z79899 Other long term (current) drug therapy: Secondary | ICD-10-CM | POA: Diagnosis not present

## 2016-06-19 DIAGNOSIS — Z23 Encounter for immunization: Secondary | ICD-10-CM | POA: Diagnosis not present

## 2016-06-19 DIAGNOSIS — Z888 Allergy status to other drugs, medicaments and biological substances status: Secondary | ICD-10-CM | POA: Insufficient documentation

## 2016-06-19 DIAGNOSIS — I200 Unstable angina: Secondary | ICD-10-CM

## 2016-06-19 DIAGNOSIS — Z86010 Personal history of colonic polyps: Secondary | ICD-10-CM | POA: Insufficient documentation

## 2016-06-19 DIAGNOSIS — Z7982 Long term (current) use of aspirin: Secondary | ICD-10-CM | POA: Diagnosis not present

## 2016-06-19 DIAGNOSIS — Z9119 Patient's noncompliance with other medical treatment and regimen: Secondary | ICD-10-CM | POA: Diagnosis not present

## 2016-06-19 DIAGNOSIS — M25511 Pain in right shoulder: Secondary | ICD-10-CM | POA: Diagnosis not present

## 2016-06-19 DIAGNOSIS — J449 Chronic obstructive pulmonary disease, unspecified: Secondary | ICD-10-CM | POA: Diagnosis not present

## 2016-06-19 DIAGNOSIS — K219 Gastro-esophageal reflux disease without esophagitis: Secondary | ICD-10-CM | POA: Insufficient documentation

## 2016-06-19 DIAGNOSIS — K5900 Constipation, unspecified: Secondary | ICD-10-CM | POA: Insufficient documentation

## 2016-06-19 DIAGNOSIS — E039 Hypothyroidism, unspecified: Secondary | ICD-10-CM | POA: Diagnosis not present

## 2016-06-19 LAB — BASIC METABOLIC PANEL WITH GFR
BUN: 10 mg/dL (ref 7–25)
CALCIUM: 10.1 mg/dL (ref 8.6–10.3)
CHLORIDE: 100 mmol/L (ref 98–110)
CO2: 28 mmol/L (ref 20–31)
CREATININE: 1.56 mg/dL — AB (ref 0.70–1.25)
GFR, Est African American: 54 mL/min — ABNORMAL LOW (ref >=60)
GFR, Est Non African American: 47 mL/min — ABNORMAL LOW (ref >=60)
GLUCOSE: 86 mg/dL (ref 65–99)
Potassium: 3.9 mmol/L (ref 3.5–5.3)
Sodium: 137 mmol/L (ref 135–146)

## 2016-06-19 LAB — CBC WITH DIFFERENTIAL/PLATELET
BASOS ABS: 0 {cells}/uL (ref 0–200)
Basophils Relative: 0 %
EOS ABS: 376 {cells}/uL (ref 15–500)
Eosinophils Relative: 4 %
HEMATOCRIT: 41.2 % (ref 38.5–50.0)
Hemoglobin: 14 g/dL (ref 13.2–17.1)
LYMPHS PCT: 22 %
Lymphs Abs: 2068 cells/uL (ref 850–3900)
MCH: 30.2 pg (ref 27.0–33.0)
MCHC: 34 g/dL (ref 32.0–36.0)
MCV: 88.8 fL (ref 80.0–100.0)
MONO ABS: 376 {cells}/uL (ref 200–950)
MPV: 10.1 fL (ref 7.5–12.5)
Monocytes Relative: 4 %
NEUTROS PCT: 70 %
Neutro Abs: 6580 cells/uL (ref 1500–7800)
Platelets: 164 10*3/uL (ref 140–400)
RBC: 4.64 MIL/uL (ref 4.20–5.80)
RDW: 16.4 % — AB (ref 11.0–15.0)
WBC: 9.4 10*3/uL (ref 3.8–10.8)

## 2016-06-19 MED ORDER — FAMOTIDINE 20 MG PO TABS: 20.0000 mg | ORAL_TABLET | Freq: Two times a day (BID) | ORAL | 1 refills | Status: DC

## 2016-06-19 MED ORDER — ATORVASTATIN CALCIUM 40 MG PO TABS: 40.0000 mg | ORAL_TABLET | Freq: Every day | ORAL | 3 refills | Status: DC

## 2016-06-19 MED ORDER — CLOPIDOGREL BISULFATE 75 MG PO TABS: ORAL_TABLET | ORAL | 3 refills | Status: DC

## 2016-06-19 MED ORDER — ALBUTEROL SULFATE HFA 108 (90 BASE) MCG/ACT IN AERS: 2.0000 | INHALATION_SPRAY | Freq: Four times a day (QID) | RESPIRATORY_TRACT | 2 refills | Status: DC | PRN

## 2016-06-19 MED ORDER — TIOTROPIUM BROMIDE MONOHYDRATE 18 MCG IN CAPS: 18.0000 ug | ORAL_CAPSULE | Freq: Every day | RESPIRATORY_TRACT | 5 refills | Status: DC

## 2016-06-19 MED ORDER — LEVOTHYROXINE SODIUM 100 MCG PO TABS: 100.0000 ug | ORAL_TABLET | Freq: Every day | ORAL | 2 refills | Status: DC

## 2016-06-19 MED ORDER — CARVEDILOL 6.25 MG PO TABS: 6.2500 mg | ORAL_TABLET | Freq: Two times a day (BID) | ORAL | 3 refills | Status: DC
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
## 2016-06-19 NOTE — Patient Instructions (Signed)
High-Fiber Diet Fiber, also called dietary fiber, is a type of carbohydrate found in fruits, vegetables, whole grains, and beans. A high-fiber diet can have many health benefits. Your health care provider may recommend a high-fiber diet to help:  Prevent constipation. Fiber can make your bowel movements more regular.  Lower your cholesterol.  Relieve hemorrhoids, uncomplicated diverticulosis, or irritable bowel syndrome.  Prevent overeating as part of a weight-loss plan.  Prevent heart disease, type 2 diabetes, and certain cancers. WHAT IS MY PLAN? The recommended daily intake of fiber includes:  38 grams for men under age 27.  97 grams for men over age 77.  70 grams for women under age 31.  7 grams for women over age 62. You can get the recommended daily intake of dietary fiber by eating a variety of fruits, vegetables, grains, and beans. Your health care provider may also recommend a fiber supplement if it is not possible to get enough fiber through your diet. WHAT DO I NEED TO KNOW ABOUT A HIGH-FIBER DIET?  Fiber supplements have not been widely studied for their effectiveness, so it is better to get fiber through food sources.  Always check the fiber content on thenutrition facts label of any prepackaged food. Look for foods that contain at least 5 grams of fiber per serving.  Ask your dietitian if you have questions about specific foods that are related to your condition, especially if those foods are not listed in the following section.  Increase your daily fiber consumption gradually. Increasing your intake of dietary fiber too quickly may cause bloating, cramping, or gas.  Drink plenty of water. Water helps you to digest fiber. WHAT FOODS CAN I EAT? Grains Whole-grain breads. Multigrain cereal. Oats and oatmeal. Brown rice. Barley. Bulgur wheat. Harper. Bran muffins. Popcorn. Rye wafer crackers. Vegetables Sweet potatoes. Spinach. Kale. Artichokes. Cabbage. Broccoli.  Green peas. Carrots. Squash. Fruits Berries. Pears. Apples. Oranges. Avocados. Prunes and raisins. Dried figs. Meats and Other Protein Sources Navy, kidney, pinto, and soy beans. Split peas. Lentils. Nuts and seeds. Dairy Fiber-fortified yogurt. Beverages Fiber-fortified soy milk. Fiber-fortified orange juice. Other Fiber bars. The items listed above may not be a complete list of recommended foods or beverages. Contact your dietitian for more options. WHAT FOODS ARE NOT RECOMMENDED? Grains White bread. Pasta made with refined flour. White rice. Vegetables Fried potatoes. Canned vegetables. Well-cooked vegetables.  Fruits Fruit juice. Cooked, strained fruit. Meats and Other Protein Sources Fatty cuts of meat. Fried Sales executive or fried fish. Dairy Milk. Yogurt. Cream cheese. Sour cream. Beverages Soft drinks. Other Cakes and pastries. Butter and oils. The items listed above may not be a complete list of foods and beverages to avoid. Contact your dietitian for more information. WHAT ARE SOME TIPS FOR INCLUDING HIGH-FIBER FOODS IN MY DIET?  Eat a wide variety of high-fiber foods.  Make sure that half of all grains consumed each day are whole grains.  Replace breads and cereals made from refined flour or white flour with whole-grain breads and cereals.  Replace white rice with brown rice, bulgur wheat, or millet.  Start the day with a breakfast that is high in fiber, such as a cereal that contains at least 5 grams of fiber per serving.  Use beans in place of meat in soups, salads, or pasta.  Eat high-fiber snacks, such as berries, raw vegetables, nuts, or popcorn.   This information is not intended to replace advice given to you by your health care provider. Make sure you discuss  any questions you have with your health care provider.   Document Released: 11/12/2005 Document Revised: 12/03/2014 Document Reviewed: 04/27/2014 Elsevier Interactive Patient Education 2016 [employer].  -  Constipation, Adult Constipation is when a person:  Poops (has a bowel movement) less than 3 times a week.  Has a hard time pooping.  Has poop that is dry, hard, or bigger than normal. HOME CARE   Eat foods with a lot of fiber in them. This includes fruits, vegetables, beans, and whole grains such as brown rice.  Avoid fatty foods and foods with a lot of sugar. This includes french fries, hamburgers, cookies, candy, and soda.  If you are not getting enough fiber from food, take products with added fiber in them (supplements).  Drink enough fluid to keep your pee (urine) clear or pale yellow.  Exercise on a regular basis, or as told by your doctor.  Go to the restroom when you feel like you need to poop. Do not hold it.  Only take medicine as told by your doctor. Do not take medicines that help you poop (laxatives) without talking to your doctor first. GET HELP RIGHT AWAY IF:   You have bright red blood in your poop (stool).  Your constipation lasts more than 4 days or gets worse.  You have belly (abdominal) or butt (rectal) pain.  You have thin poop (as thin as a pencil).  You lose weight, and it cannot be explained. MAKE SURE YOU:   Understand these instructions.  Will watch your condition.  Will get help right away if you are not doing well or get worse.   This information is not intended to replace advice given to you by your health care provider. Make sure you discuss any questions you have with your health care provider.   Document Released: 04/30/2008 Document Revised: 12/03/2014 Document Reviewed: 08/24/2013 Elsevier Interactive Patient Education 2016 [employer].  -  Gastroesophageal Reflux Disease, Adult Normally, food travels down the esophagus and stays in the stomach to be digested. If a person has gastroesophageal reflux disease (GERD), food and stomach acid move back up into the esophagus. When this happens, the esophagus becomes sore and  swollen (inflamed). Over time, GERD can make small holes (ulcers) in the lining of the esophagus. HOME CARE Diet  Follow a diet as told by your doctor. You may need to avoid foods and drinks such as:  Coffee and tea (with or without caffeine).  Drinks that contain alcohol.  Energy drinks and sports drinks.  Carbonated drinks or sodas.  Chocolate and cocoa.  Peppermint and mint flavorings.  Garlic and onions.  Horseradish.  Spicy and acidic foods, such as peppers, chili powder, curry powder, vinegar, hot sauces, and BBQ sauce.  Citrus fruit juices and citrus fruits, such as oranges, lemons, and limes.  Tomato-based foods, such as red sauce, chili, salsa, and pizza with red sauce.  Fried and fatty foods, such as donuts, french fries, potato chips, and high-fat dressings.  High-fat meats, such as hot dogs, rib eye steak, sausage, ham, and bacon.  High-fat dairy items, such as whole milk, butter, and cream cheese.  Eat small meals often. Avoid eating large meals.  Avoid drinking large amounts of liquid with your meals.  Avoid eating meals during the 2-3 hours before bedtime.  Avoid lying down right after you eat.  Do not exercise right after you eat. General Instructions  Pay attention to any changes in your symptoms.  Take over-the-counter and prescription  medicines only as told by your doctor. Do not take aspirin, ibuprofen, or other NSAIDs unless your doctor says it is okay.  Do not use any tobacco products, including cigarettes, chewing tobacco, and e-cigarettes. If you need help quitting, ask your doctor.  Wear loose clothes. Do not wear anything tight around your waist.  Raise (elevate) the head of your bed about 6 inches (15 cm).  Try to lower your stress. If you need help doing this, ask your doctor.  If you are overweight, lose an amount of weight that is healthy for you. Ask your doctor about a safe weight loss goal.  Keep all follow-up visits as  told by your doctor. This is important. GET HELP IF:  You have new symptoms.  You lose weight and you do not know why it is happening.  You have trouble swallowing, or it hurts to swallow.  You have wheezing or a cough that keeps happening.  Your symptoms do not get better with treatment.  You have a hoarse voice. GET HELP RIGHT AWAY IF:  You have pain in your arms, neck, jaw, teeth, or back.  You feel sweaty, dizzy, or light-headed.  You have chest pain or shortness of breath.  You throw up (vomit) and your throw up looks like blood or coffee grounds.  You pass out (faint).  Your poop (stool) is bloody or black.  You cannot swallow, drink, or eat.   This information is not intended to replace advice given to you by your health care provider. Make sure you discuss any questions you have with your health care provider.   Document Released: 04/30/2008 Document Revised: 08/03/2015 Document Reviewed: 03/09/2015 Elsevier Interactive Patient Education [employer].

## 2016-06-20 ENCOUNTER — Encounter: Payer: Self-pay | Admitting: Internal Medicine

## 2016-06-20 LAB — HEPATITIS C ANTIBODY: HCV Ab: NEGATIVE

## 2016-06-21 ENCOUNTER — Telehealth: Payer: Self-pay

## 2016-06-21 NOTE — Telephone Encounter (Signed)
Spoke with patient sister and went over pts lab results

## 2016-06-23 ENCOUNTER — Encounter (HOSPITAL_COMMUNITY): Payer: Self-pay | Admitting: Emergency Medicine

## 2016-06-23 ENCOUNTER — Observation Stay (HOSPITAL_COMMUNITY)
Admission: EM | Admit: 2016-06-23 | Discharge: 2016-06-26 | Disposition: A | Discharge status: Home / Self Care | Payer: Medicare Other | Attending: Internal Medicine | Admitting: Internal Medicine

## 2016-06-23 ENCOUNTER — Ambulatory Visit (INDEPENDENT_AMBULATORY_CARE_PROVIDER_SITE_OTHER)
Admission: EM | Admit: 2016-06-23 | Discharge: 2016-06-23 | Disposition: A | Discharge status: Home / Self Care | Payer: Medicare Other | Source: Home / Self Care | Attending: Emergency Medicine | Admitting: Emergency Medicine

## 2016-06-23 ENCOUNTER — Ambulatory Visit (INDEPENDENT_AMBULATORY_CARE_PROVIDER_SITE_OTHER): Payer: Medicare Other

## 2016-06-23 ENCOUNTER — Emergency Department (HOSPITAL_COMMUNITY): Payer: Medicare Other

## 2016-06-23 DIAGNOSIS — J438 Other emphysema: Secondary | ICD-10-CM

## 2016-06-23 DIAGNOSIS — Z955 Presence of coronary angioplasty implant and graft: Secondary | ICD-10-CM | POA: Diagnosis not present

## 2016-06-23 DIAGNOSIS — I25119 Atherosclerotic heart disease of native coronary artery with unspecified angina pectoris: Secondary | ICD-10-CM | POA: Diagnosis present

## 2016-06-23 DIAGNOSIS — J449 Chronic obstructive pulmonary disease, unspecified: Secondary | ICD-10-CM | POA: Insufficient documentation

## 2016-06-23 DIAGNOSIS — Z87891 Personal history of nicotine dependence: Secondary | ICD-10-CM | POA: Diagnosis not present

## 2016-06-23 DIAGNOSIS — I2510 Atherosclerotic heart disease of native coronary artery without angina pectoris: Secondary | ICD-10-CM | POA: Insufficient documentation

## 2016-06-23 DIAGNOSIS — E039 Hypothyroidism, unspecified: Secondary | ICD-10-CM | POA: Insufficient documentation

## 2016-06-23 DIAGNOSIS — N182 Chronic kidney disease, stage 2 (mild): Secondary | ICD-10-CM | POA: Diagnosis present

## 2016-06-23 DIAGNOSIS — R05 Cough: Secondary | ICD-10-CM | POA: Diagnosis not present

## 2016-06-23 DIAGNOSIS — I129 Hypertensive chronic kidney disease with stage 1 through stage 4 chronic kidney disease, or unspecified chronic kidney disease: Secondary | ICD-10-CM | POA: Diagnosis not present

## 2016-06-23 DIAGNOSIS — R079 Chest pain, unspecified: Secondary | ICD-10-CM

## 2016-06-23 DIAGNOSIS — R0600 Dyspnea, unspecified: Secondary | ICD-10-CM

## 2016-06-23 DIAGNOSIS — I10 Essential (primary) hypertension: Secondary | ICD-10-CM

## 2016-06-23 DIAGNOSIS — N183 Chronic kidney disease, stage 3 (moderate): Secondary | ICD-10-CM | POA: Insufficient documentation

## 2016-06-23 DIAGNOSIS — K219 Gastro-esophageal reflux disease without esophagitis: Secondary | ICD-10-CM | POA: Diagnosis present

## 2016-06-23 DIAGNOSIS — E785 Hyperlipidemia, unspecified: Secondary | ICD-10-CM | POA: Diagnosis present

## 2016-06-23 DIAGNOSIS — R0789 Other chest pain: Secondary | ICD-10-CM | POA: Diagnosis present

## 2016-06-23 LAB — CBC
HCT: 39.5 % (ref 39.0–52.0)
HCT: 39.3 % (ref 39.0–52.0)
HEMOGLOBIN: 13.5 g/dL (ref 13.0–17.0)
Hemoglobin: 13.2 g/dL (ref 13.0–17.0)
MCH: 29.9 pg (ref 26.0–34.0)
MCH: 29.5 pg (ref 26.0–34.0)
MCHC: 34.2 g/dL (ref 30.0–36.0)
MCHC: 33.6 g/dL (ref 30.0–36.0)
MCV: 87.6 fL (ref 78.0–100.0)
MCV: 87.9 fL (ref 78.0–100.0)
PLATELETS: 157 10*3/uL (ref 150–400)
PLATELETS: 152 10*3/uL (ref 150–400)
RBC: 4.51 MIL/uL (ref 4.22–5.81)
RBC: 4.47 MIL/uL (ref 4.22–5.81)
RDW: 15.2 % (ref 11.5–15.5)
RDW: 15.2 % (ref 11.5–15.5)
WBC: 9.2 10*3/uL (ref 4.0–10.5)
WBC: 9.9 10*3/uL (ref 4.0–10.5)

## 2016-06-23 LAB — BASIC METABOLIC PANEL
Anion gap: 9 (ref 5–15)
BUN: 8 mg/dL (ref 6–20)
CALCIUM: 10.5 mg/dL — AB (ref 8.9–10.3)
CO2: 28 mmol/L (ref 22–32)
CREATININE: 1.56 mg/dL — AB (ref 0.61–1.24)
Chloride: 102 mmol/L (ref 101–111)
GFR, EST AFRICAN AMERICAN: 53 mL/min — AB (ref >60)
GFR, EST NON AFRICAN AMERICAN: 46 mL/min — AB (ref >60)
GLUCOSE: 92 mg/dL (ref 65–99)
Potassium: 4.1 mmol/L (ref 3.5–5.1)
SODIUM: 139 mmol/L (ref 135–145)

## 2016-06-23 LAB — I-STAT TROPONIN, ED: TROPONIN I, POC: 0 ng/mL (ref 0.00–0.08)

## 2016-06-23 MED ORDER — ONDANSETRON HCL 4 MG/2ML IJ SOLN: 4.0000 mg | Freq: Four times a day (QID) | INTRAMUSCULAR | Status: DC | PRN

## 2016-06-23 MED ORDER — FLUTICASONE PROPIONATE 50 MCG/ACT NA SUSP
2.0000 | Freq: Every day | NASAL | Status: DC
  Administered 2016-06-24 – 2016-06-26 (×3): 2 via NASAL
  Filled 2016-06-23 (×2): qty 16

## 2016-06-23 MED ORDER — ONDANSETRON HCL 4 MG PO TABS: 4.0000 mg | ORAL_TABLET | Freq: Four times a day (QID) | ORAL | Status: DC | PRN

## 2016-06-23 MED ORDER — CLOPIDOGREL BISULFATE 75 MG PO TABS
75.0000 mg | ORAL_TABLET | Freq: Every day | ORAL | Status: DC
  Administered 2016-06-24 – 2016-06-26 (×3): 75 mg via ORAL
  Filled 2016-06-23 (×3): qty 1

## 2016-06-23 MED ORDER — FAMOTIDINE 20 MG PO TABS
20.0000 mg | ORAL_TABLET | Freq: Two times a day (BID) | ORAL | Status: DC
  Administered 2016-06-23 – 2016-06-26 (×6): 20 mg via ORAL
  Filled 2016-06-23 (×6): qty 1

## 2016-06-23 MED ORDER — NITROGLYCERIN 0.4 MG SL SUBL
0.4000 mg | SUBLINGUAL_TABLET | SUBLINGUAL | Status: DC | PRN
Start: 2016-06-23 — End: 2016-06-26

## 2016-06-23 MED ORDER — ASPIRIN EC 81 MG PO TBEC
81.0000 mg | DELAYED_RELEASE_TABLET | Freq: Every day | ORAL | Status: DC
Start: 2016-06-24 — End: 2016-06-26
  Administered 2016-06-24 – 2016-06-26 (×3): 81 mg via ORAL
  Filled 2016-06-23 (×3): qty 1

## 2016-06-23 MED ORDER — ENOXAPARIN SODIUM 40 MG/0.4ML ~~LOC~~ SOLN
40.0000 mg | SUBCUTANEOUS | Status: DC
  Administered 2016-06-23 – 2016-06-25 (×3): 40 mg via SUBCUTANEOUS
  Filled 2016-06-23 (×3): qty 0.4

## 2016-06-23 MED ORDER — LORATADINE 10 MG PO TABS
10.0000 mg | ORAL_TABLET | Freq: Every day | ORAL | Status: DC
  Administered 2016-06-24 – 2016-06-26 (×3): 10 mg via ORAL
  Filled 2016-06-23 (×3): qty 1

## 2016-06-23 MED ORDER — CARVEDILOL 6.25 MG PO TABS
6.2500 mg | ORAL_TABLET | Freq: Two times a day (BID) | ORAL | Status: DC
  Administered 2016-06-24 (×2): 6.25 mg via ORAL
  Filled 2016-06-23 (×2): qty 1

## 2016-06-23 MED ORDER — ADULT MULTIVITAMIN W/MINERALS CH
1.0000 | ORAL_TABLET | Freq: Every day | ORAL | Status: DC
  Administered 2016-06-24 – 2016-06-26 (×3): 1 via ORAL
  Filled 2016-06-23 (×3): qty 1

## 2016-06-23 MED ORDER — ACETAMINOPHEN 325 MG PO TABS
650.0000 mg | ORAL_TABLET | Freq: Four times a day (QID) | ORAL | Status: DC | PRN
  Administered 2016-06-24: 650 mg via ORAL
  Filled 2016-06-23: qty 2

## 2016-06-23 MED ORDER — ATORVASTATIN CALCIUM 40 MG PO TABS
40.0000 mg | ORAL_TABLET | Freq: Every day | ORAL | Status: DC
  Administered 2016-06-24 – 2016-06-26 (×3): 40 mg via ORAL
  Filled 2016-06-23 (×3): qty 1

## 2016-06-23 MED ORDER — LEVOTHYROXINE SODIUM 100 MCG PO TABS
100.0000 ug | ORAL_TABLET | Freq: Every day | ORAL | Status: DC
  Administered 2016-06-24: 100 ug via ORAL
  Filled 2016-06-23: qty 1

## 2016-06-23 MED ORDER — SODIUM CHLORIDE 0.9% FLUSH
3.0000 mL | Freq: Two times a day (BID) | INTRAVENOUS | Status: DC
  Administered 2016-06-23 – 2016-06-26 (×6): 3 mL via INTRAVENOUS

## 2016-06-23 NOTE — ED Notes (Signed)
IV team at bedside; will transport to room when finished.
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
## 2016-06-23 NOTE — ED Triage Notes (Signed)
Patient c/o left sided chest pain and shoulder pain onset 2 days ago. Patients sister reports that this is an ongoing issue and he has used his inhalers. He has been short of breath just walking to the car. Patient is in NAD. Sister reports he has been coughing a lot.

## 2016-06-23 NOTE — ED Notes (Signed)
Attempted report 

## 2016-06-23 NOTE — ED Notes (Signed)
IV team unsuccessful. ED staff member trying to obtain access with Ultrasound.

## 2016-06-23 NOTE — ED Notes (Signed)
Admitting, MD at bedside.  
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
## 2016-06-23 NOTE — ED Triage Notes (Signed)
Pt states "I've been having CP on the left side that travels down the left arm". Chest pain started Thursday after eating food. C/o mild SOB

## 2016-06-23 NOTE — ED Notes (Signed)
Spoke to [place], RN in [facility] ED about patient being transferred via shuttle.
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
## 2016-06-23 NOTE — ED Notes (Signed)
IV attempt x 2 .  Unable to obtain access.

## 2016-06-24 DIAGNOSIS — K219 Gastro-esophageal reflux disease without esophagitis: Secondary | ICD-10-CM | POA: Diagnosis not present

## 2016-06-24 DIAGNOSIS — R079 Chest pain, unspecified: Secondary | ICD-10-CM

## 2016-06-24 DIAGNOSIS — J438 Other emphysema: Secondary | ICD-10-CM | POA: Diagnosis not present

## 2016-06-24 DIAGNOSIS — N182 Chronic kidney disease, stage 2 (mild): Secondary | ICD-10-CM | POA: Diagnosis not present

## 2016-06-24 DIAGNOSIS — E039 Hypothyroidism, unspecified: Secondary | ICD-10-CM

## 2016-06-24 DIAGNOSIS — E785 Hyperlipidemia, unspecified: Secondary | ICD-10-CM

## 2016-06-24 LAB — PHOSPHORUS: PHOSPHORUS: 2.7 mg/dL (ref 2.5–4.6)

## 2016-06-24 LAB — BASIC METABOLIC PANEL
ANION GAP: 6 (ref 5–15)
BUN: <5 mg/dL — ABNORMAL LOW (ref 6–20)
CALCIUM: 9.9 mg/dL (ref 8.9–10.3)
CHLORIDE: 101 mmol/L (ref 101–111)
CO2: 30 mmol/L (ref 22–32)
Creatinine, Ser: 1.46 mg/dL — ABNORMAL HIGH (ref 0.61–1.24)
GFR calc non Af Amer: 49 mL/min — ABNORMAL LOW (ref >60)
GFR, EST AFRICAN AMERICAN: 57 mL/min — AB (ref >60)
GLUCOSE: 98 mg/dL (ref 65–99)
POTASSIUM: 3.5 mmol/L (ref 3.5–5.1)
Sodium: 137 mmol/L (ref 135–145)

## 2016-06-24 LAB — CREATININE, SERUM
Creatinine, Ser: 1.56 mg/dL — ABNORMAL HIGH (ref 0.61–1.24)
GFR, EST AFRICAN AMERICAN: 53 mL/min — AB (ref >60)
GFR, EST NON AFRICAN AMERICAN: 46 mL/min — AB (ref >60)

## 2016-06-24 LAB — TROPONIN I: Troponin I: <0.03 ng/mL (ref <0.03)

## 2016-06-24 LAB — MAGNESIUM: Magnesium: 1.9 mg/dL (ref 1.7–2.4)

## 2016-06-24 LAB — TSH: TSH: >90 u[IU]/mL — ABNORMAL HIGH (ref 0.350–4.500)

## 2016-06-24 MED ORDER — LEVOTHYROXINE SODIUM 25 MCG PO TABS
125.0000 ug | ORAL_TABLET | Freq: Every day | ORAL | Status: DC
  Administered 2016-06-25 – 2016-06-26 (×2): 125 ug via ORAL
  Filled 2016-06-24 (×2): qty 1

## 2016-06-24 MED ORDER — GI COCKTAIL ~~LOC~~: 30.0000 mL | Freq: Three times a day (TID) | ORAL | Status: DC | PRN

## 2016-06-24 MED ORDER — IPRATROPIUM-ALBUTEROL 0.5-2.5 (3) MG/3ML IN SOLN: 3.0000 mL | Freq: Four times a day (QID) | RESPIRATORY_TRACT | Status: DC | PRN

## 2016-06-24 MED ORDER — TIOTROPIUM BROMIDE MONOHYDRATE 18 MCG IN CAPS
18.0000 ug | ORAL_CAPSULE | Freq: Every day | RESPIRATORY_TRACT | Status: DC
  Administered 2016-06-24 – 2016-06-26 (×3): 18 ug via RESPIRATORY_TRACT
  Filled 2016-06-24: qty 5

## 2016-06-24 MED ORDER — PANTOPRAZOLE SODIUM 40 MG PO TBEC
40.0000 mg | DELAYED_RELEASE_TABLET | Freq: Every day | ORAL | Status: DC
  Administered 2016-06-24 – 2016-06-26 (×3): 40 mg via ORAL
  Filled 2016-06-24 (×3): qty 1

## 2016-06-24 MED ORDER — CARVEDILOL 3.125 MG PO TABS
3.1250 mg | ORAL_TABLET | Freq: Two times a day (BID) | ORAL | Status: DC
  Administered 2016-06-25 – 2016-06-26 (×3): 3.125 mg via ORAL
  Filled 2016-06-24 (×3): qty 1

## 2016-06-24 NOTE — Progress Notes (Signed)
PROGRESS NOTE    George Robbins  [id number] DOB: [birth year] DOA: 06/23/2016 PCP: [name], MD   Brief Narrative:   George Robbins is a 65 y.o. male with medical history significant of CAD, status post stent placement 2, GERD, chronic kidney disease, hypertension, hyperlipidemia, hypothyroidism comes to the emergency department referred from [facility] urgent center due to chest pain since Thursday evening.  Per patient, since Thursday evening he started experiencing burning chest pain associated with mild dyspnea, radiated to his left shoulder and arm while eating. He denied nausea, dizziness, palpitations or diaphoresis. He denies relieving or exacerbating factors. He had another similar episode today associated with more dyspnea. His sister became concerned and asked him to come to the emergency department.  ED Course: He is currently in no acute distress and chest pain free. Troponin level was normal and EKG did not show any significant changes when compared to previous tracings.  Assessment & Plan:   Principal Problem:   Nonspecific chest pain Active Problems:   CKD (chronic kidney disease) stage 2, GFR 60-89 ml/min   HTN (hypertension)   GERD (gastroesophageal reflux disease)   CAD (coronary artery disease)   Hyperlipidemia   COPD (chronic obstructive pulmonary disease) (HCC)   Hypothyroidism  #1 chest pain Patient presented with chest pain which sounds atypical as patient describes as a sharp pain as well as a burning component. Patient denies any active chest pain. Patient however with prior history of coronary artery disease status post stent placement 2, hypertension, hyperlipidemia, hypothyroidism. Patient also unable to read per sister. Cardiac enzymes negative 2. TSH is greater than 90. Will check a 2-D echo. Decrease home dose of Coreg due to borderline blood pressure. Continue Pepcid. GI cocktail when necessary. Cardiology consultation pending.  Follow.  #2 hypertension Blood pressure is borderline. Decrease Coreg to half home dose.  #3 gastroesophageal reflux disease Pepcid. Also add PPI.  #4 COPD Stable.  #5 hypothyroidism TSH is greater than 90. Patient states had been out of his medication for the past 3-4 weeks. In speaking with patient's sister it is noted that patient cannot read. Patient will likely benefit from home health RN to help with medications. Will increase home dose Synthroid to 125 MCG's daily. Patient will need repeat TFTs done in about 4-6 weeks with outpatient follow-up.  #6 hyperlipidemia LDL from 03/03/2015 was 105. Check a fasting lipid panel. Continue statin.  #7 chronic kidney disease stage II  Stable.  #8 coronary artery disease See problem #1. Continue aspirin, Lipitor, Coreg, Plavix.    DVT prophylaxis: Lovenox Code Status: Full Family Communication: Updated patient and sister at bedside. Disposition Plan: Home once cardiac workup was completed.   Consultants:   Cardiology pending   Procedures:   Chest x-ray 06/23/2016  Antimicrobials:   None   Subjective: Patient denies any active chest pain. No shortness of breath.  Objective: Vitals:   06/24/16 0916 06/24/16 1453 06/24/16 1510 06/24/16 1650  BP:   (!) 92/54 112/71  Pulse:   77 68  Resp:   (!) 32 (!) 25  Temp:  98.4 F (36.9 C)    TempSrc:  Oral    SpO2: 99%  96% 96%  Weight:      Height:        Intake/Output Summary (Last 24 hours) at 06/24/16 1701 Last data filed at 06/24/16 1351  Gross per 24 hour  Intake              240 ml  Output              350 ml  Net             -110 ml   Filed Weights   06/23/16 2243 06/24/16 0532  Weight: 85.7 kg (189 lb) 85.1 kg (187 lb 11.2 oz)    Examination:  General exam: Appears calm and comfortable  Respiratory system: Clear to auscultation. Respiratory effort normal. Cardiovascular system: S1 & S2 heard, RRR. No JVD, murmurs, rubs, gallops or clicks. No pedal  edema. Gastrointestinal system: Abdomen is nondistended, soft and nontender. No organomegaly or masses felt. Normal bowel sounds heard. Central nervous system: Alert and oriented. No focal neurological deficits. Extremities: Symmetric 5 x 5 power. Skin: No rashes, lesions or ulcers Psychiatry: Judgement and insight appear poor to fair. Mood & affect appropriate.     Data Reviewed: I have personally reviewed following labs and imaging studies  CBC:  Recent Labs Lab 06/19/16 1530 06/23/16 1420 06/23/16 2253  WBC 9.4 9.2 9.9  NEUTROABS 6,580  --   --   HGB 14.0 13.5 13.2  HCT 41.2 39.5 39.3  MCV 88.8 87.6 87.9  PLT 164 157 0000000   Basic Metabolic Panel:  Recent Labs Lab 06/19/16 1530 06/23/16 1420 06/23/16 2253 06/24/16 1126  NA 137 139  --  137  K 3.9 4.1  --  3.5  CL 100 102  --  101  CO2 28 28  --  30  GLUCOSE 86 92  --  98  BUN 10 8  --  <5*  CREATININE 1.56* 1.56* 1.56* 1.46*  CALCIUM 10.1 10.5*  --  9.9  MG  --   --  1.9  --   PHOS  --   --  2.7  --    GFR: Estimated Creatinine Clearance: 51 mL/min (by C-G formula based on SCr of 1.46 mg/dL). Liver Function Tests: No results for input(s): AST, ALT, ALKPHOS, BILITOT, PROT, ALBUMIN in the last 168 hours. No results for input(s): LIPASE, AMYLASE in the last 168 hours. No results for input(s): AMMONIA in the last 168 hours. Coagulation Profile: No results for input(s): INR, PROTIME in the last 168 hours. Cardiac Enzymes:  Recent Labs Lab 06/23/16 2253 06/24/16 0438  TROPONINI <0.03 <0.03   BNP (last 3 results) No results for input(s): PROBNP in the last 8760 hours. HbA1C: No results for input(s): HGBA1C in the last 72 hours. CBG: No results for input(s): GLUCAP in the last 168 hours. Lipid Profile: No results for input(s): CHOL, HDL, LDLCALC, TRIG, CHOLHDL, LDLDIRECT in the last 72 hours. Thyroid Function Tests:  Recent Labs  06/24/16 1126  TSH >90.000*   Anemia Panel: No results for input(s):  VITAMINB12, FOLATE, FERRITIN, TIBC, IRON, RETICCTPCT in the last 72 hours. Sepsis Labs: No results for input(s): PROCALCITON, LATICACIDVEN in the last 168 hours.  No results found for this or any previous visit (from the past 240 hour(s)).       Radiology Studies: Dg Chest 2 View  Result Date: 06/23/2016 CLINICAL DATA:  Left chest pain EXAM: CHEST  2 VIEW COMPARISON:  June 23, 2016 FINDINGS: Stable left basilar scarring. The heart, hila, mediastinum, lungs, and pleura are otherwise unremarkable. IMPRESSION: Stable left basilar scarring. Electronically Signed   By: Dorise Bullion III M.D   On: 06/23/2016 14:37  Dg Chest 2 View  Result Date: 06/23/2016 CLINICAL DATA:  Chest pain since Thursday, cough. EXAM: CHEST  2 VIEW COMPARISON:  02/28/2016 FINDINGS: Linear densities  at the left base, stable compatible with scarring. Right lung is clear. Heart is normal size. No effusions or acute bony abnormality. IMPRESSION: Left basilar scarring.  No active disease. Electronically Signed   By: Rolm Baptise M.D.   On: 06/23/2016 13:14       Scheduled Meds: . aspirin EC  81 mg Oral Daily  . atorvastatin  40 mg Oral Daily  . carvedilol  6.25 mg Oral BID WC  . clopidogrel  75 mg Oral Daily  . enoxaparin (LOVENOX) injection  40 mg Subcutaneous Q24H  . famotidine  20 mg Oral BID  . fluticasone  2 spray Each Nare Daily  . levothyroxine  100 mcg Oral QAC breakfast  . loratadine  10 mg Oral Daily  . multivitamin with minerals  1 tablet Oral Daily  . pantoprazole  40 mg Oral Q0600  . sodium chloride flush  3 mL Intravenous Q12H  . tiotropium  18 mcg Inhalation Daily   Continuous Infusions:    LOS: 0 days    Time spent: 29 minutes    Tanaiya Kolarik, MD [facility] Pager [phone]  If 7PM-7AM, please contact night-coverage [email or web address] Password [email or web address] 06/24/2016, 5:01 PM

## 2016-06-25 ENCOUNTER — Other Ambulatory Visit: Payer: Self-pay | Admitting: *Deleted

## 2016-06-25 ENCOUNTER — Observation Stay (HOSPITAL_BASED_OUTPATIENT_CLINIC_OR_DEPARTMENT_OTHER): Payer: Medicare Other

## 2016-06-25 DIAGNOSIS — R079 Chest pain, unspecified: Secondary | ICD-10-CM

## 2016-06-25 DIAGNOSIS — E785 Hyperlipidemia, unspecified: Secondary | ICD-10-CM | POA: Diagnosis not present

## 2016-06-25 DIAGNOSIS — K219 Gastro-esophageal reflux disease without esophagitis: Secondary | ICD-10-CM | POA: Diagnosis not present

## 2016-06-25 DIAGNOSIS — I10 Essential (primary) hypertension: Secondary | ICD-10-CM | POA: Diagnosis not present

## 2016-06-25 LAB — BASIC METABOLIC PANEL
Anion gap: 9 (ref 5–15)
BUN: 13 mg/dL (ref 6–20)
CO2: 29 mmol/L (ref 22–32)
Calcium: 10 mg/dL (ref 8.9–10.3)
Chloride: 101 mmol/L (ref 101–111)
Creatinine, Ser: 1.68 mg/dL — ABNORMAL HIGH (ref 0.61–1.24)
GFR calc Af Amer: 48 mL/min — ABNORMAL LOW (ref >60)
GFR calc non Af Amer: 42 mL/min — ABNORMAL LOW (ref >60)
GLUCOSE: 104 mg/dL — AB (ref 65–99)
POTASSIUM: 3.9 mmol/L (ref 3.5–5.1)
Sodium: 139 mmol/L (ref 135–145)

## 2016-06-25 LAB — ECHOCARDIOGRAM COMPLETE
CHL CUP DOP CALC LVOT VTI: 14 cm
CHL CUP MV DEC (S): 271
E decel time: 271 msec
EERAT: 7.53
FS: 39 % (ref 28–44)
Height: 64 in
IVS/LV PW RATIO, ED: 0.99
LA diam end sys: 27 mm
LA diam index: 1.35 cm/m2
LA vol A4C: 46.3 ml
LASIZE: 27 mm
LAVOL: 50.7 mL
LAVOLIN: 25.4 mL/m2
LDCA: 3.14 cm2
LV E/e' medial: 7.53
LV E/e'average: 7.53
LV SIMPSON'S DISK: 57
LV dias vol index: 49 mL/m2
LV dias vol: 98 mL (ref 62–150)
LV sys vol: 42 mL (ref 21–61)
LVELAT: 8.05 cm/s
LVOT SV: 44 mL
LVOTD: 20 mm
LVOTPV: 85 cm/s
LVSYSVOLIN: 21 mL/m2
Lateral S' vel: 11.3 cm/s
MV pk A vel: 67.4 m/s
MV pk E vel: 60.6 m/s
PV Reg grad dias: 5 mmHg
PV Reg vel dias: 113 cm/s
PW: 7.75 mm — AB (ref 0.6–1.1)
RV TAPSE: 18.9 mm
Stroke v: 55 ml
TDI e' lateral: 8.05
TDI e' medial: 5.98
Weight: 3022.4 oz

## 2016-06-25 LAB — LIPID PANEL
CHOL/HDL RATIO: 3.9 ratio
Cholesterol: 234 mg/dL — ABNORMAL HIGH (ref 0–200)
HDL: 60 mg/dL (ref >40)
LDL Cholesterol: 145 mg/dL — ABNORMAL HIGH (ref 0–99)
Triglycerides: 143 mg/dL (ref <150)
VLDL: 29 mg/dL (ref 0–40)

## 2016-06-25 LAB — CBC
HEMATOCRIT: 37.4 % — AB (ref 39.0–52.0)
HEMOGLOBIN: 12.3 g/dL — AB (ref 13.0–17.0)
MCH: 29.4 pg (ref 26.0–34.0)
MCHC: 32.9 g/dL (ref 30.0–36.0)
MCV: 89.5 fL (ref 78.0–100.0)
PLATELETS: 148 10*3/uL — AB (ref 150–400)
RBC: 4.18 MIL/uL — AB (ref 4.22–5.81)
RDW: 15.6 % — ABNORMAL HIGH (ref 11.5–15.5)
WBC: 8.4 10*3/uL (ref 4.0–10.5)

## 2016-06-25 MED ORDER — PERFLUTREN LIPID MICROSPHERE
INTRAVENOUS | Status: AC
  Filled 2016-06-25: qty 10

## 2016-06-25 MED ORDER — PERFLUTREN LIPID MICROSPHERE
1.0000 mL | INTRAVENOUS | Status: AC | PRN
  Administered 2016-06-25: 2 mL via INTRAVENOUS
  Filled 2016-06-25: qty 10

## 2016-06-25 MED ORDER — EZETIMIBE 10 MG PO TABS
10.0000 mg | ORAL_TABLET | Freq: Every day | ORAL | Status: DC
  Administered 2016-06-25 – 2016-06-26 (×2): 10 mg via ORAL
  Filled 2016-06-25 (×2): qty 1

## 2016-06-25 NOTE — Care Management Note (Addendum)
Case Management Note  Patient Details  Name: George Robbins MRN: [id number] Date of Birth: 1951-09-21  Subjective/Objective: Pt presented for chest pain and noncompliance with medications. Pt has insurance and uses the [facility] for PCP. Pt has a sister that takes him to appointments. Pt is unable to read. CM did call the sister [place] Agent in ref to disposition needs.                    Action/Plan: Pt is agreeable to [place]- permission to call sister. Agency list offered via the phone and pt's sister chose [facility]. Referral sent to [facility] for [facility] RN for medication assistance. Blister pack /color coding pill boxes were some suggestions for pt. No further needs at this time. [facility] to f/u outpatient.   Expected Discharge Date:                  Expected Discharge Plan:  [place]  In-House Referral:  NA  Discharge planning Services  CM Consult  Post Acute Care Choice:  Home Health Choice offered to:  Patient  DME Arranged:  N/A DME Agency:     HH Arranged:  RN (Medication Management. ) [place]:  [place]  Status of Service:  Completed, signed off  If discussed at [place] of Stay Meetings, dates discussed:    Additional Comments:  George Roys, RN 06/25/2016, 11:54 AM

## 2016-06-25 NOTE — Care Management Obs Status (Signed)
[place] NOTIFICATION   Patient Details  Name: George Robbins MRN: [id number] Date of Birth: 05-20-1951   Medicare Observation Status Notification Given:  Yes    Bethena Roys, RN 06/25/2016, 11:50 AM

## 2016-06-25 NOTE — Progress Notes (Signed)
  Echocardiogram 2D Echocardiogram has been performed.  Aggie Cosier 06/25/2016, 4:31 PM

## 2016-06-25 NOTE — Consult Note (Addendum)
   [facility] CM Inpatient Consult   06/25/2016  George Robbins [birth year] [id number]   Spoke with Dr. Grandville Silos who asked for [place] Management to follow George Robbins who is unable to read. Dr. Grandville Silos is concerned that patient will not be able to take his medications correctly due to his illiteracy. Spoke with George Robbins at bedside and called his sister as well, George Robbins at [phone] to explain and discuss [place] Management. She is agreeable and written consent was obtained. Will refer to [facility] Pharmacist for medication adherence due to George Robbins being unable to read. [facility] Care Management packet at bedside for George Robbins (sister) to review. Patient lives alone. Denies having issues with transportation. Confirms Primary Care MD is with [facility]. He has a history of HTN, COPD, CAD. Made inpatient RNCM aware that [place] Management will follow post discharge.  George Robbins (sister) requests to be contact for post hospital transition of care calls and the like. She is George Robbins primary contact and assists with his care regarding medications and transportation with appointments.  Will request to be assigned to [place] [facility] [place], [place], RN,BSN [facility] Liaison [phone]

## 2016-06-25 NOTE — Progress Notes (Addendum)
PROGRESS NOTE    George Robbins  [id number] DOB: Nov 21, 1951 DOA: 06/23/2016 PCP: [name], MD   Brief Narrative:   George Robbins is a 65 y.o. male with medical history significant of CAD, status post stent placement 2, GERD, chronic kidney disease, hypertension, hyperlipidemia, hypothyroidism comes to the emergency department referred from [facility] urgent center due to chest pain since Thursday evening.  Per patient, since Thursday evening he started experiencing burning chest pain associated with mild dyspnea, radiated to his left shoulder and arm while eating. He denied nausea, dizziness, palpitations or diaphoresis. He denies relieving or exacerbating factors. He had another similar episode today associated with more dyspnea. His sister became concerned and asked him to come to the emergency department.  ED Course: He is currently in no acute distress and chest pain free. Troponin level was normal and EKG did not show any significant changes when compared to previous tracings.  Assessment & Plan:   Principal Problem:   Nonspecific chest pain Active Problems:   CKD (chronic kidney disease) stage 2, GFR 60-89 ml/min   HTN (hypertension)   GERD (gastroesophageal reflux disease)   CAD (coronary artery disease)   Hyperlipidemia   COPD (chronic obstructive pulmonary disease) (HCC)   Hypothyroidism  #1 chest pain Patient presented with chest pain which sounds atypical as patient describes as a sharp pain as well as a burning component. Patient denies any active chest pain. Patient however with prior history of coronary artery disease status post stent placement 2, hypertension, hyperlipidemia, hypothyroidism. Patient also unable to read per sister. Cardiac enzymes negative 2. TSH is greater than 90. 2-D echo pending. Decreased home dose of Coreg due to borderline blood pressure. Continue Pepcid. GI cocktail when necessary. Cardiology consultation pending. If 2-D echo was  normal will try to set patient up for outpatient stress test. Follow.  #2 hypertension Blood pressure is borderline. Decreased Coreg to half home dose.  #3 gastroesophageal reflux disease Pepcid. Also add PPI.  #4 COPD Stable.  #5 hypothyroidism TSH is greater than 90. Patient states had been out of his medication for the past 3-4 weeks. In speaking with patient's sister it is noted that patient cannot read. Patient will likely benefit from home health RN to help with medications. Increased home dose Synthroid to 125 MCG's daily. Patient will need repeat TFTs done in about 4-6 weeks with outpatient follow-up.  #6 hyperlipidemia LDL from 03/03/2015 was 105. Fasting lipid panel with LDL of 145. Goal LDL less than 70. Continue Lipitor. Add Zetia. Will need outpatient follow-up.   #7 chronic kidney disease stage II  Stable.  #8 coronary artery disease See problem #1. Continue aspirin, Lipitor, Coreg, Plavix.  #9 chronic kidney disease stage III Stable. Outpatient follow-up.    DVT prophylaxis: Lovenox Code Status: Full Family Communication: Updated patient. No family at bedside. Disposition Plan: Home once cardiac workup was completed.   Consultants:   Cardiology pending   Procedures:   Chest x-ray 06/23/2016  Antimicrobials:   None   Subjective: Patient denies any active chest pain. No shortness of breath.  Objective: Vitals:   06/24/16 1510 06/24/16 1650 06/24/16 1949 06/25/16 0510  BP: (!) 92/54 112/71 94/61 103/69  Pulse: 77 68 75 65  Resp: (!) 32 (!) 25 (!) 32 (!) 25  Temp:   98.1 F (36.7 C) 98.3 F (36.8 C)  TempSrc:   Oral Oral  SpO2: 96% 96% 98% 97%  Weight:    85.7 kg (188 lb 14.4  oz)  Height:        Intake/Output Summary (Last 24 hours) at 06/25/16 1132 Last data filed at 06/25/16 0903  Gross per 24 hour  Intake              963 ml  Output              550 ml  Net              413 ml   Filed Weights   06/23/16 2243 06/24/16 0532  06/25/16 0510  Weight: 85.7 kg (189 lb) 85.1 kg (187 lb 11.2 oz) 85.7 kg (188 lb 14.4 oz)    Examination:  General exam: Appears calm and comfortable  Respiratory system: Clear to auscultation. Respiratory effort normal. Cardiovascular system: S1 & S2 heard, RRR. No JVD, murmurs, rubs, gallops or clicks. No pedal edema. Gastrointestinal system: Abdomen is nondistended, soft and nontender. No organomegaly or masses felt. Normal bowel sounds heard. Central nervous system: Alert and oriented. No focal neurological deficits. Extremities: Symmetric 5 x 5 power. Skin: No rashes, lesions or ulcers Psychiatry: Judgement and insight appear poor to fair. Mood & affect appropriate.     Data Reviewed: I have personally reviewed following labs and imaging studies  CBC:  Recent Labs Lab 06/19/16 1530 06/23/16 1420 06/23/16 2253 06/25/16 0504  WBC 9.4 9.2 9.9 8.4  NEUTROABS 6,580  --   --   --   HGB 14.0 13.5 13.2 12.3*  HCT 41.2 39.5 39.3 37.4*  MCV 88.8 87.6 87.9 89.5  PLT 164 157 152 123456*   Basic Metabolic Panel:  Recent Labs Lab 06/19/16 1530 06/23/16 1420 06/23/16 2253 06/24/16 1126 06/25/16 0504  NA 137 139  --  137 139  K 3.9 4.1  --  3.5 3.9  CL 100 102  --  101 101  CO2 28 28  --  30 29  GLUCOSE 86 92  --  98 104*  BUN 10 8  --  <5* 13  CREATININE 1.56* 1.56* 1.56* 1.46* 1.68*  CALCIUM 10.1 10.5*  --  9.9 10.0  MG  --   --  1.9  --   --   PHOS  --   --  2.7  --   --    GFR: Estimated Creatinine Clearance: 44.4 mL/min (by C-G formula based on SCr of 1.68 mg/dL). Liver Function Tests: No results for input(s): AST, ALT, ALKPHOS, BILITOT, PROT, ALBUMIN in the last 168 hours. No results for input(s): LIPASE, AMYLASE in the last 168 hours. No results for input(s): AMMONIA in the last 168 hours. Coagulation Profile: No results for input(s): INR, PROTIME in the last 168 hours. Cardiac Enzymes:  Recent Labs Lab 06/23/16 2253 06/24/16 0438  TROPONINI <0.03 <0.03    BNP (last 3 results) No results for input(s): PROBNP in the last 8760 hours. HbA1C: No results for input(s): HGBA1C in the last 72 hours. CBG: No results for input(s): GLUCAP in the last 168 hours. Lipid Profile:  Recent Labs  06/25/16 0504  CHOL 234*  HDL 60  LDLCALC 145*  TRIG 143  CHOLHDL 3.9   Thyroid Function Tests:  Recent Labs  06/24/16 1126  TSH >90.000*   Anemia Panel: No results for input(s): VITAMINB12, FOLATE, FERRITIN, TIBC, IRON, RETICCTPCT in the last 72 hours. Sepsis Labs: No results for input(s): PROCALCITON, LATICACIDVEN in the last 168 hours.  No results found for this or any previous visit (from the past 240 hour(s)).  Radiology Studies: Dg Chest 2 View  Result Date: 06/23/2016 CLINICAL DATA:  Left chest pain EXAM: CHEST  2 VIEW COMPARISON:  June 23, 2016 FINDINGS: Stable left basilar scarring. The heart, hila, mediastinum, lungs, and pleura are otherwise unremarkable. IMPRESSION: Stable left basilar scarring. Electronically Signed   By: Dorise Bullion III M.D   On: 06/23/2016 14:37  Dg Chest 2 View  Result Date: 06/23/2016 CLINICAL DATA:  Chest pain since Thursday, cough. EXAM: CHEST  2 VIEW COMPARISON:  02/28/2016 FINDINGS: Linear densities at the left base, stable compatible with scarring. Right lung is clear. Heart is normal size. No effusions or acute bony abnormality. IMPRESSION: Left basilar scarring.  No active disease. Electronically Signed   By: Rolm Baptise M.D.   On: 06/23/2016 13:14       Scheduled Meds: . aspirin EC  81 mg Oral Daily  . atorvastatin  40 mg Oral Daily  . carvedilol  3.125 mg Oral BID WC  . clopidogrel  75 mg Oral Daily  . enoxaparin (LOVENOX) injection  40 mg Subcutaneous Q24H  . ezetimibe  10 mg Oral Daily  . famotidine  20 mg Oral BID  . fluticasone  2 spray Each Nare Daily  . levothyroxine  125 mcg Oral QAC breakfast  . loratadine  10 mg Oral Daily  . multivitamin with minerals  1 tablet Oral  Daily  . pantoprazole  40 mg Oral Q0600  . sodium chloride flush  3 mL Intravenous Q12H  . tiotropium  18 mcg Inhalation Daily   Continuous Infusions:    LOS: 0 days    Time spent: 65 minutes    THOMPSON,DANIEL, MD [facility] Pager [phone]  If 7PM-7AM, please contact night-coverage [email or web address] Password [facility] 06/25/2016, 11:32 AM

## 2016-06-26 ENCOUNTER — Other Ambulatory Visit: Payer: Self-pay | Admitting: Physician Assistant

## 2016-06-26 DIAGNOSIS — I10 Essential (primary) hypertension: Secondary | ICD-10-CM | POA: Diagnosis not present

## 2016-06-26 DIAGNOSIS — I25119 Atherosclerotic heart disease of native coronary artery with unspecified angina pectoris: Secondary | ICD-10-CM | POA: Diagnosis not present

## 2016-06-26 DIAGNOSIS — R079 Chest pain, unspecified: Secondary | ICD-10-CM

## 2016-06-26 DIAGNOSIS — N182 Chronic kidney disease, stage 2 (mild): Secondary | ICD-10-CM | POA: Diagnosis not present

## 2016-06-26 LAB — BASIC METABOLIC PANEL
Anion gap: 6 (ref 5–15)
BUN: 15 mg/dL (ref 6–20)
CHLORIDE: 100 mmol/L — AB (ref 101–111)
CO2: 32 mmol/L (ref 22–32)
CREATININE: 1.57 mg/dL — AB (ref 0.61–1.24)
Calcium: 10.3 mg/dL (ref 8.9–10.3)
GFR calc non Af Amer: 45 mL/min — ABNORMAL LOW (ref >60)
GFR, EST AFRICAN AMERICAN: 52 mL/min — AB (ref >60)
Glucose, Bld: 107 mg/dL — ABNORMAL HIGH (ref 65–99)
POTASSIUM: 3.9 mmol/L (ref 3.5–5.1)
SODIUM: 138 mmol/L (ref 135–145)

## 2016-06-26 MED ORDER — PANTOPRAZOLE SODIUM 40 MG PO TBEC: 40.0000 mg | DELAYED_RELEASE_TABLET | Freq: Every day | ORAL | 0 refills | Status: DC

## 2016-06-26 MED ORDER — ATORVASTATIN CALCIUM 40 MG PO TABS: 40.0000 mg | ORAL_TABLET | Freq: Every day | ORAL | 3 refills | Status: DC

## 2016-06-26 MED ORDER — CARVEDILOL 3.125 MG PO TABS: 3.1250 mg | ORAL_TABLET | Freq: Two times a day (BID) | ORAL | 0 refills | Status: DC

## 2016-06-26 MED ORDER — EZETIMIBE 10 MG PO TABS: 10.0000 mg | ORAL_TABLET | Freq: Every day | ORAL | 0 refills | Status: DC

## 2016-06-26 MED ORDER — TIOTROPIUM BROMIDE MONOHYDRATE 18 MCG IN CAPS: 18.0000 ug | ORAL_CAPSULE | Freq: Every day | RESPIRATORY_TRACT | 0 refills | Status: DC

## 2016-06-26 MED ORDER — LEVOTHYROXINE SODIUM 125 MCG PO TABS: 125.0000 ug | ORAL_TABLET | Freq: Every day | ORAL | 0 refills | Status: DC

## 2016-06-26 MED ORDER — NITROGLYCERIN 0.4 MG SL SUBL: 0.4000 mg | SUBLINGUAL_TABLET | SUBLINGUAL | 0 refills | Status: DC | PRN

## 2016-06-26 MED ORDER — CLOPIDOGREL BISULFATE 75 MG PO TABS: 75.0000 mg | ORAL_TABLET | Freq: Every day | ORAL | 0 refills | Status: DC

## 2016-06-26 NOTE — Progress Notes (Signed)
Discharge teaching completed with patient and sister, follow up appointments and instructions gone over several times with patient and sister. Encouraged to call with any questions we can assist with.

## 2016-06-26 NOTE — Discharge Summary (Signed)
Physician Discharge Summary  George Robbins [id number] DOB: [birth year] DOA: 06/23/2016  PCP: George Reamer, MD  Admit date: 06/23/2016 Discharge date: 06/26/2016  Time spent: 65 minutes  Recommendations for Outpatient Follow-up:  1. Follow-up with George Reamer, MD in 2 weeks. On follow-up patient will need a basic metabolic profile done to follow-up on electrolytes and renal function. Patient also need thyroid function studies done in about 4-6 weeks as was noted to be significantly hypothyroid with a TSH of > 90. 2. Follow-up with George Kicks, NP on 07/24/2016 at 10:30 AM. 3. Patient to follow-up with cardiology for outpatient stress test on 07/04/2016   Discharge Diagnoses:  Principal Problem:   Nonspecific chest pain Active Problems:   CKD (chronic kidney disease) stage 2, GFR 60-89 ml/min   HTN (hypertension)   GERD (gastroesophageal reflux disease)   Coronary artery disease involving native coronary artery of native heart with angina pectoris (HCC)   Hyperlipidemia   COPD (chronic obstructive pulmonary disease) ([place])   Hypothyroidism   Discharge Condition: Stable and improved  Diet recommendation: Heart healthy  Filed Weights   06/24/16 0532 06/25/16 0510 06/26/16 0423  Weight: 85.1 kg (187 lb 11.2 oz) 85.7 kg (188 lb 14.4 oz) 86.5 kg (190 lb 9.6 oz)    History of present illness:  Per Dr Ellender Hose is a 65 y.o. male with medical history significant of CAD, status post stent placement 2, GERD, chronic kidney disease, hypertension, hyperlipidemia, hypothyroidism presented to the emergency department referred from [facility] urgent center due to chest pain since Thursday evening.  Per patient, since Thursday evening he started experiencing burning chest pain associated with mild dyspnea, radiated to his left shoulder and arm while eating. He denied nausea, dizziness, palpitations or diaphoresis. He denied relieving or exacerbating factors. He had  another similar episode on the day of admission, associated with more dyspnea. His sister became concerned and asked him to come to the emergency department.  ED Course: He was in no acute distress and chest pain free. Troponin level was normal and EKG did not show any significant changes when compared to previous tracings. Hospital Course:  #1 chest pain Patient presented with chest pain which sounded atypical as patient described it as a sharp pain as well as a burning component. Patient denied any active chest pain. Patient however with prior history of coronary artery disease status post stent placement 2, hypertension, hyperlipidemia, hypothyroidism. Patient also unable to read per sister. Cardiac enzymes negative 2. TSH > 90. 2-D echo with a EF of 55% with no wall nausea and abnormalities, grade 1 diastolic dysfunction. No significant valvular abnormalities.  Patient was maintained on a PPI and Pepcid during the hospitalization. Patient's Coreg was decreased to half his home dose due to borderline blood pressure. Patient remained in stable condition did not have any further chest pain and patient will be discharged to home.  #2 hypertension Blood pressure is borderline. Decreased Coreg to half home dose.  #3 gastroesophageal reflux disease Patient was initially started on the Pepcid as dialysis home regimen. Per sister patient was on a PPI however discontinued reason unknown. Patient was placed in a PPI jaundice hospitalization be discharged on a PPI. Outpatient follow-up.   #4 COPD Stable.  #5 hypothyroidism TSH is greater than 90. Patient states had been out of his medication for the past 3-4 weeks. In speaking with patient's sister it is noted that patient cannot read. Patient will likely benefit from home health  RN to help with medications. Increased home dose Synthroid to 125 MCG's daily. Patient will need repeat TFTs done in about 4-6 weeks with outpatient follow-up.  #6  hyperlipidemia LDL from 03/03/2015 was 105. Fasting lipid panel with LDL of 145. Goal LDL less than 70. Patient was maintained on home regimen of Lipitor. Zetia was added to patient's regimen. Outpatient follow-up.   #7 chronic kidney disease stage II  Stable.  #8 coronary artery disease See problem #1. Patient was maintained on aspirin, Lipitor, Coreg, Plavix.  #9 chronic kidney disease stage III Stable. Outpatient follow-up.   Procedures:  Chest x-ray 06/23/2016  2-D echo 06/25/2016  Consultations:  None  Discharge Exam: Vitals:   06/26/16 0423 06/26/16 0441  BP: 93/67 111/68  Pulse: 71 76  Resp: (!) 25 (!) 22  Temp: 98 F (36.7 C)     General: NAD Cardiovascular: RRR Respiratory: CTAB  Discharge Instructions   Discharge Instructions    AMB Referral to [place] Management    Complete by:  As directed   Please assign to [facility] Pharmacist. [facility] RNCM for transition of care. History of COPD, HTN. [facility] Pharmacist for medication adherence. Please see notes. George Robbins (sister) should be called for transition of care calls and the like at [phone]. Written consent obtained. Please call with questions. Thanks. [name], [place], RN,BSN [facility] Liaison-[phone]   Reason for consult:  Please assign to [facility] Pharmacist   Diagnoses of:  COPD/ Pneumonia   Expected date of contact:  1-3 days (reserved for hospital discharges)   Diet - low sodium heart healthy    Complete by:  As directed   Increase activity slowly    Complete by:  As directed     Current Discharge Medication List    START taking these medications   Details  ezetimibe (ZETIA) 10 MG tablet Take 1 tablet (10 mg total) by mouth daily. Qty: 30 tablet, Refills: 0    pantoprazole (PROTONIX) 40 MG tablet Take 1 tablet (40 mg total) by mouth daily at 6 (six) AM. Qty: 30 tablet, Refills: 0      CONTINUE these medications which have CHANGED   Details   atorvastatin (LIPITOR) 40 MG tablet Take 1 tablet (40 mg total) by mouth daily. Patient needs office visit for refills Qty: 30 tablet, Refills: 3   Associated Diagnoses: Coronary artery disease involving native coronary artery of native heart without angina pectoris    carvedilol (COREG) 3.125 MG tablet Take 1 tablet (3.125 mg total) by mouth 2 (two) times daily with a meal. Qty: 60 tablet, Refills: 0    clopidogrel (PLAVIX) 75 MG tablet Take 1 tablet (75 mg total) by mouth daily with breakfast. take 1 tablet by mouth once daily WITH BREAKFAS Qty: 30 tablet, Refills: 0   Associated Diagnoses: Coronary artery disease involving native coronary artery of native heart without angina pectoris    levothyroxine (SYNTHROID, LEVOTHROID) 125 MCG tablet Take 1 tablet (125 mcg total) by mouth daily before breakfast. Qty: 30 tablet, Refills: 0    nitroGLYCERIN (NITROSTAT) 0.4 MG SL tablet Place 1 tablet (0.4 mg total) under the tongue every 5 (five) minutes as needed for chest pain. Qty: 15 tablet, Refills: 0    tiotropium (SPIRIVA) 18 MCG inhalation capsule Place 1 capsule (18 mcg total) into inhaler and inhale daily. Qty: 30 capsule, Refills: 0      CONTINUE these medications which have NOT CHANGED   Details  acetaminophen (TYLENOL) 325 MG tablet Take 650 mg by mouth every 6 (six) hours as needed for moderate pain.    albuterol (PROVENTIL HFA;VENTOLIN HFA) 108 (90 Base) MCG/ACT inhaler Inhale 2 puffs into the lungs every 6 (six) hours as needed for wheezing or shortness of breath. For cough Qty: 1 Inhaler, Refills: 2    aspirin EC 81 MG tablet Take 1 tablet (81 mg total) by mouth daily. Qty: 30 tablet, Refills: 5   Associated Diagnoses: Coronary artery disease involving native coronary artery of native heart without angina pectoris    fluticasone (FLONASE) 50 MCG/ACT nasal spray Place 2 sprays into both nostrils daily. For allergies Qty: 16 g, Refills: 3    loratadine (CLARITIN) 10 MG  tablet Take 1 tablet (10 mg total) by mouth daily. Qty: 30 tablet, Refills: 11    Multiple Vitamin (MULTIVITAMIN WITH MINERALS) TABS tablet Take 1 tablet by mouth daily.    OVER THE COUNTER MEDICATION Place 1 drop into both eyes daily. Over the counter allergy eye drop      STOP taking these medications     famotidine (PEPCID) 20 MG tablet        Allergies  Allergen Reactions  . Simvastatin Rash   Follow-up Information    [facility] .   Why:  Registered Nurse for Medication Management.  Contact information: [address] [place] [address] [phone]        [place] [facility] Follow up on 07/04/2016.   Specialty:  Cardiology Why:  10:00AM. Outpatient stress test, no food or drink after midnight on the day of stress test, sips of water with medication ok, but no caffeine Contact information: [address] [place]       George Kicks, NP Follow up on 07/24/2016.   Specialties:  Cardiology, Radiology Why:  10:30AM. Cardiology visit. Contact information: [place] [address] [place] [address] [phone]        George Reamer, MD. Call on 07/02/2016.   Specialty:  Internal Medicine Why:  call on Monday to schedule follow up appt Contact information: [place] [address] [phone]            The results of significant diagnostics from this hospitalization (including imaging, microbiology, ancillary and laboratory) are listed below for reference.    Significant Diagnostic Studies: Dg Chest 2 View  Result Date: 06/23/2016 CLINICAL DATA:  Left chest pain EXAM: CHEST  2 VIEW COMPARISON:  June 23, 2016 FINDINGS: Stable left basilar scarring. The heart, hila, mediastinum, lungs, and pleura are otherwise unremarkable. IMPRESSION: Stable left basilar scarring. Electronically Signed   By: Dorise Bullion III M.D   On: 06/23/2016  14:37  Dg Chest 2 View  Result Date: 06/23/2016 CLINICAL DATA:  Chest pain since Thursday, cough. EXAM: CHEST  2 VIEW COMPARISON:  02/28/2016 FINDINGS: Linear densities at the left base, stable compatible with scarring. Right lung is clear. Heart is normal size. No effusions or acute bony abnormality. IMPRESSION: Left basilar scarring.  No active disease. Electronically Signed   By: Rolm Baptise M.D.   On: 06/23/2016 13:14   Microbiology: No results found for this or any previous visit (from the past 240 hour(s)).   Labs: Basic Metabolic Panel:  Recent Labs Lab 06/19/16 1530 06/23/16 1420 06/23/16 2253 06/24/16 1126 06/25/16 0504 06/26/16 0358  NA 137 139  --  137 139 138  K 3.9 4.1  --  3.5 3.9 3.9  CL 100 102  --  101 101 100*  CO2 28 28  --  30 29 32  GLUCOSE 86 92  --  98 104* 107*  BUN 10 8  --  <5* 13 15  CREATININE 1.56* 1.56* 1.56* 1.46* 1.68* 1.57*  CALCIUM 10.1 10.5*  --  9.9 10.0 10.3  MG  --   --  1.9  --   --   --   PHOS  --   --  2.7  --   --   --    Liver Function Tests: No results for input(s): AST, ALT, ALKPHOS, BILITOT, PROT, ALBUMIN in the last 168 hours. No results for input(s): LIPASE, AMYLASE in the last 168 hours. No results for input(s): AMMONIA in the last 168 hours. CBC:  Recent Labs Lab 06/19/16 1530 06/23/16 1420 06/23/16 2253 06/25/16 0504  WBC 9.4 9.2 9.9 8.4  NEUTROABS 6,580  --   --   --   HGB 14.0 13.5 13.2 12.3*  HCT 41.2 39.5 39.3 37.4*  MCV 88.8 87.6 87.9 89.5  PLT 164 157 152 148*   Cardiac Enzymes:  Recent Labs Lab 06/23/16 2253 06/24/16 0438  TROPONINI <0.03 <0.03   BNP: BNP (last 3 results) No results for input(s): BNP in the last 8760 hours.  ProBNP (last 3 results) No results for input(s): PROBNP in the last 8760 hours.  CBG: No results for input(s): GLUCAP in the last 168 hours.     SignedIrine Seal MD.  [facility] 06/26/2016, 1:25 PM

## 2016-06-26 NOTE — Progress Notes (Signed)
Discussed with Dr. Grandville Silos, he felt chest pain was atypical. Planning for discharge. I will set up outpatient lexiscan myoview.  Hilbert Corrigan PA Pager: [phone]

## 2016-06-27 ENCOUNTER — Other Ambulatory Visit: Payer: Self-pay

## 2016-06-27 DIAGNOSIS — R0789 Other chest pain: Secondary | ICD-10-CM

## 2016-06-27 NOTE — Patient Outreach (Addendum)
[facility]) Care Management  06/27/2016  George Robbins Nov 17, 1951 [id number]   RNCM called for transition of care. Spoke with member who confirmed date of birth and address. Member reports he is feeling better. Denies any questions or concerns. Member reports he was in the hospital because of his lungs, discharge summary reports chest pain.   Member reports he takes his medications and that his sister helps set up medications. Member was not able to state the names of the medication. Member gave permission for RNCM to call his sister, [name], regarding discharge instructions. [name] was not in. HIPPA compliant message left.  Member was in hospital under observation status therefore not placed in a transition of care program.  Plan: await return call and follow up.  Thea Silversmith, RN, MSN, [place] Coordinator Cell: [phone]

## 2016-06-28 ENCOUNTER — Other Ambulatory Visit: Payer: Self-pay

## 2016-06-28 ENCOUNTER — Other Ambulatory Visit: Payer: Self-pay | Admitting: Emergency Medicine

## 2016-06-28 NOTE — Patient Outreach (Signed)
[facility]) Care Management  06/28/2016  George Robbins 1951-08-26 [id number]   RNCM called member's sister for follow up transition of care call. [name]. [name] reports she manages member's medications. GeorgeRobbins reports member had been taking his medications wrong prior to hospitalization, so she has taken over medication management. Member does not read and she has concerns regarding his taking medications correctly. She reports it is too early to tell if the pill box system is working. Discharge instructions reviewed. Follow up appointments reviewed.  Plan: pharmacy referral for medication management, home visit scheduled.  George Silversmith, RN, MSN, [place] Coordinator Cell: [phone]

## 2016-07-02 ENCOUNTER — Telehealth (HOSPITAL_COMMUNITY): Payer: Self-pay | Admitting: *Deleted

## 2016-07-02 NOTE — Telephone Encounter (Signed)
Patient given detailed instructions per Myocardial Perfusion Study Information Sheet for the test on 07/04/16 at 1000. Patient notified to arrive 15 minutes early and that it is imperative to arrive on time for appointment to keep from having the test rescheduled.  If you need to cancel or reschedule your appointment, please call the office within 24 hours of your appointment. Failure to do so may result in a cancellation of your appointment, and a $50 no show fee. Patient verbalized understanding.Matteo Banke, [place]

## 2016-07-03 ENCOUNTER — Encounter: Payer: Self-pay | Admitting: Emergency Medicine

## 2016-07-03 ENCOUNTER — Ambulatory Visit (INDEPENDENT_AMBULATORY_CARE_PROVIDER_SITE_OTHER): Payer: Medicare Other | Admitting: Emergency Medicine

## 2016-07-03 DIAGNOSIS — I200 Unstable angina: Secondary | ICD-10-CM | POA: Diagnosis not present

## 2016-07-03 DIAGNOSIS — J438 Other emphysema: Secondary | ICD-10-CM | POA: Diagnosis not present

## 2016-07-03 NOTE — Progress Notes (Signed)
Subjective:    Patient ID: George Robbins, male    DOB: [birth year], 65 y.o.   MRN: [id number]  Cough  Associated symptoms include postnasal drip, rhinorrhea and shortness of breath.   65 yo man, former smoker (30 pk-yrs), history of CAD s/p PTCI, GERD, hypertension, hypothyroidism, chronic kidney disease, prior traumatic pneumothorax. He has a history of chronic cough. Also noted to have mixed obstruction and restriction on pulmonary function testing  ROV 64/15/16 -- follow-up visit for mixed obstruction and restriction, chronic cough. He has been maintained on Spiriva and albuterol as needed. Since our last visit he underwent a modified barium swallow and I personally reviewed the results today. He had minimal to mild pharyngeal dysphagia and a delayed swallow initiation without any obvious laryngeal penetration or aspiration. He was started on swallowing and reflux precautions and a normal diet. His cough is a bit better but still happens w meals. He has been reliable with his Spiriva.  Needs refill on albuterol and nasal spray  ROV 10/25/15 -- follow-up visit for chronic cough, history of traumatic pneumothorax, GERD, CAD. He has mixed disease on spirometry from 06/09/14 that I personally reviewed today. There has been some question of delayed swallow in the past but no obvious aspiration on barium swallow. He has had flaring of his chronic cough in September and was seen by Dr Melvyn Novas and TP.  A trial off his Spiriva was performed at that time. He is having some increased SOB, especially up hills. He states that he is taking his loratadine, fluticasone nasal spray. He is on pantoprazole once a day.  He uses albuterol many mornings. His cough is certainly less than last visit.   ROV 05/14/16 -- Patient has a history of chronic cough, chronic traumatic pneumothorax, GERD, hypertension, coronary disease. He has mixed obstruction and restriction on pulmonary function testing. He's been seen twice in  our office since my last visit with him. On one occasion he had been unable to obtain his Spiriva. He was seen in April for a discomfort in his chest that was making it difficult for him to take a deep breath. He had CT scan of his abdomen on 03/20/16 that showed no intra-abdominal abnormality but some left lower lobe atelectasis. He is on protonix bid, flonase. He tells me today that he has been feeling bad > inability to take a deep breath, has has to stop with hills and extended walking, taking trash to road. He has albuterol that he is using qd. He has been off spiriva since April sample ran out. He also has run out of synthroid and atorvastatin.   ROV 07/03/16 -- Patient With mixed obstruction and restriction on pulmonary function testing, history of chronic cough, a remote traumatic pneumothorax, GERD, hypertension and coronary disease. At his last visit his dyspnea appeared to be worse. He had stopped his Spiriva after running out. Last visit I changed him back to the Spiriva HandiHaler >  He feels that his dyspnea is a bit better. He was hospitalized end of July for chest pain and dyspnea. He has cardiac stress testing scheduled for 07/04/16. It was discovered that his TSH was > 90! He had been off synthroid for several months.   He has albuterol, uses it about once a day. He is on loratadine, protonix, nasal steroid. His exercise tolerance is limited, he has to stop when walking only a few feet.  No flowsheet data found.   Review of Systems  HENT: Positive for postnasal drip and rhinorrhea.   Respiratory: Positive for cough and shortness of breath.        Objective:   Physical Exam  Vitals:   07/03/16 1130  BP: 126/86  Pulse: 85  SpO2: 95%  Weight: 188 lb (85.3 kg)  Height: 5\' 4"  (1.626 m)   Gen: Pleasant,  well-nourished, in no distress,  normal affect  ENT: No lesions,  mouth clear,  oropharynx clear, no postnasal drip  Neck: No JVD, no TMG, no carotid bruits  Lungs: No use of accessory muscles, clear without rales or rhonchi  Cardiovascular: RRR, heart sounds normal, no murmur or gallops, no peripheral edema  Musculoskeletal: No deformities, no cyanosis or clubbing  Neuro: alert, non focal  Skin: Warm, no lesions or rashes       Assessment & Plan:  COPD (chronic obstructive pulmonary disease) (HCC) This was clearly worse at our last visit all of Spiriva. I suspect that his overall decline in function is been related not only to his COPD but also due to the fact that his hypothyroidism was untreated. He is now back on Synthroid. Also note his recent hospitalization and the need for cardiac risk stratification. Coronary disease could be playing a role as well. He is scheduled for stress test tomorrow.  Unstable angina Stress testing scheduled for 07/04/16  Unspecified hypothyroidism- TSH 88 Now back on Synthroid. I stressed with him the importance of not stopping this medication without doctor's instructions  Baltazar Apo, MD, PhD 07/03/2016, 11:55 AM [facility] [phone] or if no answer [phone]

## 2016-07-03 NOTE — Patient Instructions (Signed)
Please continue your current medications as you have been talking them, including your Spiriva  Get your cardiac stress testing as planned.  Do not stop your thyroid medication or change it unless directed by a doctor.  Follow with Dr Lamonte Sakai in 3 months or sooner if you have any problems.

## 2016-07-03 NOTE — Assessment & Plan Note (Signed)
Stress testing scheduled for 07/04/16

## 2016-07-03 NOTE — Assessment & Plan Note (Signed)
This was clearly worse at our last visit all of Spiriva. I suspect that his overall decline in function is been related not only to his COPD but also due to the fact that his hypothyroidism was untreated. He is now back on Synthroid. Also note his recent hospitalization and the need for cardiac risk stratification. Coronary disease could be playing a role as well. He is scheduled for stress test tomorrow.

## 2016-07-03 NOTE — Assessment & Plan Note (Signed)
Now back on Synthroid. I stressed with him the importance of not stopping this medication without doctor's instructions

## 2016-07-04 ENCOUNTER — Ambulatory Visit (HOSPITAL_COMMUNITY): Payer: Medicare Other | Attending: Cardiology

## 2016-07-04 ENCOUNTER — Encounter (INDEPENDENT_AMBULATORY_CARE_PROVIDER_SITE_OTHER): Payer: Self-pay

## 2016-07-04 DIAGNOSIS — I10 Essential (primary) hypertension: Secondary | ICD-10-CM | POA: Diagnosis not present

## 2016-07-04 DIAGNOSIS — R0609 Other forms of dyspnea: Secondary | ICD-10-CM | POA: Insufficient documentation

## 2016-07-04 DIAGNOSIS — R079 Chest pain, unspecified: Secondary | ICD-10-CM

## 2016-07-04 LAB — MYOCARDIAL PERFUSION IMAGING
CHL CUP NUCLEAR SRS: 3
CHL CUP NUCLEAR SSS: 8
LHR: 0.26
LV dias vol: 71 mL (ref 62–150)
LV sys vol: 32 mL
Peak HR: 108 {beats}/min
Rest HR: 72 {beats}/min
SDS: 5
TID: 0.85

## 2016-07-04 MED ORDER — TECHNETIUM TC 99M TETROFOSMIN IV KIT
10.2000 | PACK | Freq: Once | INTRAVENOUS | Status: AC | PRN
  Administered 2016-07-04: 10 via INTRAVENOUS
  Filled 2016-07-04: qty 10

## 2016-07-04 MED ORDER — REGADENOSON 0.4 MG/5ML IV SOLN
0.4000 mg | Freq: Once | INTRAVENOUS | Status: AC
  Administered 2016-07-04: 0.4 mg via INTRAVENOUS

## 2016-07-04 MED ORDER — TECHNETIUM TC 99M TETROFOSMIN IV KIT
31.7000 | PACK | Freq: Once | INTRAVENOUS | Status: AC | PRN
  Administered 2016-07-04: 31.7 via INTRAVENOUS
  Filled 2016-07-04: qty 32

## 2016-07-05 ENCOUNTER — Other Ambulatory Visit: Payer: Self-pay

## 2016-07-05 NOTE — Patient Outreach (Addendum)
[facility]) Care Management  [place]  07/05/2016   George Robbins 1951/10/21 [id number]  Subjective: "I been doing ok". Denies chest pain.  Objective: BP 108/78   Pulse 85   Resp 20   Ht 1.626 m (5\' 4" )   Wt 188 lb (85.3 kg)   SpO2 95%   BMI 32.27 kg/m    Encounter Medications:  Outpatient Encounter Prescriptions as of 07/05/2016  Medication Sig  . acetaminophen (TYLENOL) 325 MG tablet Take 650 mg by mouth every 6 (six) hours as needed for moderate pain.  Marland Kitchen albuterol (PROVENTIL HFA;VENTOLIN HFA) 108 (90 Base) MCG/ACT inhaler Inhale 2 puffs into the lungs every 6 (six) hours as needed for wheezing or shortness of breath. For cough  . aspirin EC 81 MG tablet Take 1 tablet (81 mg total) by mouth daily.  Marland Kitchen atorvastatin (LIPITOR) 40 MG tablet Take 1 tablet (40 mg total) by mouth daily. Patient needs office visit for refills  . carvedilol (COREG) 3.125 MG tablet Take 1 tablet (3.125 mg total) by mouth 2 (two) times daily with a meal.  . clopidogrel (PLAVIX) 75 MG tablet Take 1 tablet (75 mg total) by mouth daily with breakfast. take 1 tablet by mouth once daily WITH BREAKFAS  . ezetimibe (ZETIA) 10 MG tablet Take 1 tablet (10 mg total) by mouth daily.  . fluticasone (FLONASE) 50 MCG/ACT nasal spray instill 2 sprays into each nostril once daily  . levothyroxine (SYNTHROID, LEVOTHROID) 125 MCG tablet Take 1 tablet (125 mcg total) by mouth daily before breakfast.  . loratadine (CLARITIN) 10 MG tablet Take 1 tablet (10 mg total) by mouth daily.  . Multiple Vitamin (MULTIVITAMIN WITH MINERALS) TABS tablet Take 1 tablet by mouth daily.  . nitroGLYCERIN (NITROSTAT) 0.4 MG SL tablet Place 1 tablet (0.4 mg total) under the tongue every 5 (five) minutes as needed for chest pain.  Marland Kitchen OVER THE COUNTER MEDICATION Place 1 drop into both eyes daily. Over the counter allergy eye drop  . pantoprazole (PROTONIX) 40 MG tablet Take 1 tablet (40 mg total) by mouth daily at 6  (six) AM.  . tiotropium (SPIRIVA) 18 MCG inhalation capsule Place 1 capsule (18 mcg total) into inhaler and inhale daily.   No facility-administered encounter medications on file as of 07/05/2016.     Functional Status:  In your present state of health, do you have any difficulty performing the following activities: 07/05/2016 06/23/2016  Hearing? Tempie Donning  Vision? N N  Difficulty concentrating or making decisions? N N  Walking or climbing stairs? Y N  Dressing or bathing? N N  Doing errands, shopping? N N  Preparing Food and eating ? N -  Using the Toilet? N -  In the past six months, have you accidently leaked urine? N -  Do you have problems with loss of bowel control? N -  Managing your Medications? Y -  Managing your Finances? Y -  Housekeeping or managing your Housekeeping? N -  Some recent data might be hidden    Fall/Depression Screening: PHQ 2/9 Scores 07/05/2016 06/19/2016 05/03/2014 03/01/2014  PHQ - 2 Score 0 0 0 0   Fall Risk  07/05/2016 03/01/2014  Falls in the past year? No No    Assessment: Co-visit with pharmacist. 65 year old with recent observation admission for chest pain. Per member and his sister, stress test was normal. Member's sister with concerns that member had not been taking his medication correctly prior to hospitalization. His sister  has now taken over managing his medications.   Home health ordered at discharge for medication assistance. Member's sister reports that home health has not come out to see member. Sister acknowledges that she got a call from them and she call back back, but they did not speak with each other. RNCM called while in the home and was informed that home health tried to reach and was unable to reach three times-case was closed. Also informed that a new referral would need to be sent if home health to be started. Member has an appointment next week with primary care. Member's sister to discuss with primary care next week.  Member has a history  of COPD, both member and his sister acknowledge they would like more information regarding COPD and COPD management.  Plan: continue to follow, home visit scheduled for education COPD. [facility] CM Care Plan Problem One   Flowsheet Row Most Recent Value  Care Plan Problem One  possible care coordination needs.  Role Documenting the Problem One  Care Management Coordinator  Care Plan for Problem One  Active  [facility] CM Short Term Goal #1 (0-30 days)  member will verbalize care coordination needs at next home visit. within the next 30 days.  THN CM Short Term Goal #1 Start Date  06/27/16  Interventions for Short Term Goal #1  called to home health agency while in the home to follow up on home health engagement., send update to primary care.    [facility] CM Care Plan Problem Two   Flowsheet Row Most Recent Value  Care Plan Problem Two  knowledge deficit related to COPD managment  Role Documenting the Problem Two  Care Management Coordinator  Care Plan for Problem Two  Active  Interventions for Problem Two Long Term Goal   RNCM provided/introduced the COPD action plan  THN Long Term Goal (31-90) days  member and/or sister will verbalize at least three strategies for COPD managment within the next 31-90 days  THN Long Term Goal Start Date  07/05/16  THN CM Short Term Goal #1 (0-30 days)  caregiver/member will verbalize yellow zone signs/symptoms and actions within the next 30 days.  [facility]N CM Short Term Goal #1 Start Date  07/05/16  Interventions for Short Term Goal #2   copd zone tool introduced.  THN CM Short Term Goal #2 (0-30 days)  member will attend follow up appointment as scheduled within the next 30 days.  [facility]N CM Short Term Goal #2 Start Date  07/05/16  Interventions for Short Term Goal #2  discussed upcoming appointments.      Thea Silversmith, RN, MSN, [place] Coordinator Cell: [phone]

## 2016-07-05 NOTE — Patient Outreach (Signed)
[facility]) Care Management  [place]   07/05/2016  JAYVONN OGARRO [birth year] [id number]  Subjective: Mr. Vanvooren is a 65 year old male who was referred to pharmacy for review and education of his medication.  I am completing this home visit with Thea Silversmith, RN his nurse care manager.  His sister is currently filling his pill box.  Currently he is taking medication four times a day. He is waking up at 6 am to take one medication.  He has forgotten to take one of his medication on multiple days.  He does not have a good understanding of why he is on all of his medications.  His sister has a better understanding of each medication and the purpose of each one.    Objective:   Encounter Medications: Outpatient Encounter Prescriptions as of 07/05/2016  Medication Sig  . acetaminophen (TYLENOL) 325 MG tablet Take 650 mg by mouth every 6 (six) hours as needed for moderate pain.  Marland Kitchen albuterol (PROVENTIL HFA;VENTOLIN HFA) 108 (90 Base) MCG/ACT inhaler Inhale 2 puffs into the lungs every 6 (six) hours as needed for wheezing or shortness of breath. For cough  . aspirin EC 81 MG tablet Take 1 tablet (81 mg total) by mouth daily.  Marland Kitchen atorvastatin (LIPITOR) 40 MG tablet Take 1 tablet (40 mg total) by mouth daily. Patient needs office visit for refills  . carvedilol (COREG) 3.125 MG tablet Take 1 tablet (3.125 mg total) by mouth 2 (two) times daily with a meal.  . clopidogrel (PLAVIX) 75 MG tablet Take 1 tablet (75 mg total) by mouth daily with breakfast. take 1 tablet by mouth once daily WITH BREAKFAS  . dextromethorphan (DELSYM) 30 MG/5ML liquid Take 30 mg by mouth daily as needed for cough.  . ezetimibe (ZETIA) 10 MG tablet Take 1 tablet (10 mg total) by mouth daily.  . fluticasone (FLONASE) 50 MCG/ACT nasal spray instill 2 sprays into each nostril once daily  . levothyroxine (SYNTHROID, LEVOTHROID) 125 MCG tablet Take 1 tablet (125 mcg total) by mouth daily before  breakfast.  . loratadine (CLARITIN) 10 MG tablet Take 1 tablet (10 mg total) by mouth daily.  . Multiple Vitamin (MULTIVITAMIN WITH MINERALS) TABS tablet Take 1 tablet by mouth daily.  . nitroGLYCERIN (NITROSTAT) 0.4 MG SL tablet Place 1 tablet (0.4 mg total) under the tongue every 5 (five) minutes as needed for chest pain.  Marland Kitchen OVER THE COUNTER MEDICATION Place 1 drop into both eyes daily. Over the counter allergy eye drop  . pantoprazole (PROTONIX) 40 MG tablet Take 1 tablet (40 mg total) by mouth daily at 6 (six) AM.  . tiotropium (SPIRIVA) 18 MCG inhalation capsule Place 1 capsule (18 mcg total) into inhaler and inhale daily.   No facility-administered encounter medications on file as of 07/05/2016.     Functional Status: In your present state of health, do you have any difficulty performing the following activities: 07/05/2016 06/23/2016  Hearing? Tempie Donning  Vision? N N  Difficulty concentrating or making decisions? N N  Walking or climbing stairs? Y N  Dressing or bathing? N N  Doing errands, shopping? N N  Preparing Food and eating ? N -  Using the Toilet? N -  In the past six months, have you accidently leaked urine? N -  Do you have problems with loss of bowel control? N -  Managing your Medications? Y -  Managing your Finances? Y -  Housekeeping or managing your Housekeeping? N -  Some recent data might be hidden    Fall/Depression Screening: PHQ 2/9 Scores 07/05/2016 06/19/2016 05/03/2014 03/01/2014  PHQ - 2 Score 0 0 0 0    Assessment: Patient was recently discharged from hospital and all medications have been reviewed.  Drugs sorted by system:  Neurologic/Psychologic:  Cardiovascular: atorvastatin, carvedilol, clopidogrel, ezetimibe, nitrostat   Pulmonary/Allergy: albuterol, spiriva, allergy eye drops, dexomethorphan, fluticasone, loratadine  Gastrointestinal: pantoprazole  Endocrine: levothyroxine  Renal: none  Topical: none  Pain: acetaminophen  Vitamins/Minerals:  multivitamin  Infectious Diseases: none   Duplications in therapy: none Gaps in therapy: none Medications to avoid in the elderly:  Pantoprazole (increase risk of fractures and development of C. Diff) Drug interactions: none Other issues noted: medication adherence   Plan: 1.  I reviewed his medications with Mr. Amerman and his sister.  I arranged his medications so that he only had to take his medications twice a day.  This will help him remember to take his medications more.   2.  His sister is doing a good job filling his pill box.  I gave him an additional pill box to allow her to fill two weeks worth at a time. 3.  I will follow up in 2 to 3 weeks to see how he is doing with compliance.  Deanne Coffer, PharmD, [place] [phone]

## 2016-07-11 ENCOUNTER — Encounter: Payer: Self-pay | Admitting: Internal Medicine

## 2016-07-11 ENCOUNTER — Ambulatory Visit: Payer: Medicare Other | Attending: Internal Medicine | Admitting: Internal Medicine

## 2016-07-11 DIAGNOSIS — R05 Cough: Secondary | ICD-10-CM | POA: Insufficient documentation

## 2016-07-11 DIAGNOSIS — N182 Chronic kidney disease, stage 2 (mild): Secondary | ICD-10-CM

## 2016-07-11 DIAGNOSIS — K219 Gastro-esophageal reflux disease without esophagitis: Secondary | ICD-10-CM | POA: Insufficient documentation

## 2016-07-11 DIAGNOSIS — E039 Hypothyroidism, unspecified: Secondary | ICD-10-CM | POA: Diagnosis not present

## 2016-07-11 DIAGNOSIS — E785 Hyperlipidemia, unspecified: Secondary | ICD-10-CM

## 2016-07-11 DIAGNOSIS — I200 Unstable angina: Secondary | ICD-10-CM | POA: Diagnosis not present

## 2016-07-11 DIAGNOSIS — I129 Hypertensive chronic kidney disease with stage 1 through stage 4 chronic kidney disease, or unspecified chronic kidney disease: Secondary | ICD-10-CM | POA: Diagnosis not present

## 2016-07-11 DIAGNOSIS — Z7982 Long term (current) use of aspirin: Secondary | ICD-10-CM | POA: Diagnosis not present

## 2016-07-11 DIAGNOSIS — R079 Chest pain, unspecified: Secondary | ICD-10-CM | POA: Diagnosis present

## 2016-07-11 DIAGNOSIS — I2510 Atherosclerotic heart disease of native coronary artery without angina pectoris: Secondary | ICD-10-CM | POA: Diagnosis not present

## 2016-07-11 DIAGNOSIS — Z87891 Personal history of nicotine dependence: Secondary | ICD-10-CM | POA: Diagnosis not present

## 2016-07-11 DIAGNOSIS — Z79899 Other long term (current) drug therapy: Secondary | ICD-10-CM | POA: Insufficient documentation

## 2016-07-11 DIAGNOSIS — R053 Chronic cough: Secondary | ICD-10-CM

## 2016-07-11 NOTE — Patient Instructions (Signed)
Please take your medications as prescribed We will likely check your labs at your next OV
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
## 2016-07-11 NOTE — Progress Notes (Signed)
Patient is here for HFU  Patient denies pain at this time.  Patient has taken medication today and patient has eaten today.

## 2016-07-11 NOTE — Assessment & Plan Note (Signed)
Marked hypothyroidsism Discussed the importance of taking meds- heand his sister voice undersatnding F/u one month with labs at that time

## 2016-07-11 NOTE — Assessment & Plan Note (Signed)
Resolved I suspect symptoms were related to hypothyroidism- TSH= 90

## 2016-07-11 NOTE — Assessment & Plan Note (Signed)
This continues He has had evaluation and I will not address further

## 2016-07-11 NOTE — Assessment & Plan Note (Signed)
Stable - will need f/u yearly or more often based on sxs

## 2016-07-11 NOTE — Assessment & Plan Note (Signed)
He is back on meds now If he is compliant with atormastatin I doubt that he will need ezetimibe

## 2016-07-24 ENCOUNTER — Ambulatory Visit (INDEPENDENT_AMBULATORY_CARE_PROVIDER_SITE_OTHER): Payer: Medicare Other | Admitting: Cardiology

## 2016-07-24 ENCOUNTER — Encounter: Payer: Self-pay | Admitting: Cardiology

## 2016-07-24 VITALS — BP 120/78 | HR 84 | Ht 64.0 in | Wt 185.8 lb

## 2016-07-24 DIAGNOSIS — I2510 Atherosclerotic heart disease of native coronary artery without angina pectoris: Secondary | ICD-10-CM | POA: Diagnosis not present

## 2016-07-24 DIAGNOSIS — R079 Chest pain, unspecified: Secondary | ICD-10-CM | POA: Diagnosis not present

## 2016-07-24 DIAGNOSIS — E034 Atrophy of thyroid (acquired): Secondary | ICD-10-CM

## 2016-07-24 DIAGNOSIS — E785 Hyperlipidemia, unspecified: Secondary | ICD-10-CM

## 2016-07-24 DIAGNOSIS — I200 Unstable angina: Secondary | ICD-10-CM

## 2016-07-24 DIAGNOSIS — E038 Other specified hypothyroidism: Secondary | ICD-10-CM | POA: Diagnosis not present
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
## 2016-07-24 NOTE — Patient Instructions (Signed)
Follow-Up  Your physician wants you to follow-up in: 1 year with Dr. Acie Fredrickson. You will receive a reminder letter in the mail two months in advance. If you don't receive a letter, please call our office to schedule the follow-up appointment.   If you need a refill on your cardiac medications before your next appointment, please call your pharmacy.

## 2016-07-25 ENCOUNTER — Encounter: Payer: Self-pay | Admitting: Cardiology

## 2016-07-25 ENCOUNTER — Other Ambulatory Visit: Payer: Self-pay

## 2016-07-25 MED ORDER — CARVEDILOL 3.125 MG PO TABS: 3.1250 mg | ORAL_TABLET | Freq: Two times a day (BID) | ORAL | 11 refills | Status: DC

## 2016-07-25 MED ORDER — EZETIMIBE 10 MG PO TABS: 10.0000 mg | ORAL_TABLET | Freq: Every day | ORAL | 11 refills | Status: DC

## 2016-07-25 NOTE — Patient Outreach (Signed)
George Robbins) Care Management  George Robbins   07/25/2016  George Robbins [birth year] [id number]  Subjective: George Robbins is a 65 year old male who was referred to pharmacy for medication adherence.  Today I am completing a home visit with George Silversmith, RN, nurse care coordinator.  He has all his medications for today and tomorrow.  He needs refills on ezetimibe, carvedilol, pantoprazole, and levothyroxine.  He stated he went to the pharmacy and requested refills to be sent to his provider but has not heard anything back.  I called [facility] and determined they sent the request to the hospitalist.  I went ahead and sent a request to George Robbins and [facility] for these medications who are his providers managing these medications.  He stated he is doing well.  He has been 100% adherent to his medications.  He likes only taking his medications twice a day instead of four times a day.  Objective:   Encounter Medications: Outpatient Encounter Prescriptions as of 07/25/2016  Medication Sig Note  . acetaminophen (TYLENOL) 325 MG tablet Take 650 mg by mouth every 6 (six) hours as needed for moderate pain.   Marland Kitchen albuterol (PROVENTIL HFA;VENTOLIN HFA) 108 (90 Base) MCG/ACT inhaler Inhale 2 puffs into the lungs every 6 (six) hours as needed for wheezing or shortness of breath. For cough   . aspirin EC 81 MG tablet Take 1 tablet (81 mg total) by mouth daily.   Marland Kitchen atorvastatin (LIPITOR) 40 MG tablet Take 1 tablet (40 mg total) by mouth daily. Patient needs office visit for refills   . carvedilol (COREG) 3.125 MG tablet Take 1 tablet (3.125 mg total) by mouth 2 (two) times daily with a meal.   . clopidogrel (PLAVIX) 75 MG tablet Take 1 tablet (75 mg total) by mouth daily with breakfast. take 1 tablet by mouth once daily WITH BREAKFAS   . ezetimibe (ZETIA) 10 MG tablet Take 1 tablet (10 mg total) by mouth daily.   . fluticasone (FLONASE) 50 MCG/ACT nasal spray instill 2 sprays  into each nostril once daily   . levothyroxine (SYNTHROID, LEVOTHROID) 125 MCG tablet Take 1 tablet (125 mcg total) by mouth daily before breakfast.   . loratadine (CLARITIN) 10 MG tablet Take 1 tablet (10 mg total) by mouth daily.   . Multiple Vitamin (MULTIVITAMIN WITH MINERALS) TABS tablet Take 1 tablet by mouth daily.   Marland Kitchen OVER THE COUNTER MEDICATION Place 1 drop into both eyes daily. Over the counter allergy eye drop   . pantoprazole (PROTONIX) 40 MG tablet Take 1 tablet (40 mg total) by mouth daily at 6 (six) AM.   . tiotropium (SPIRIVA) 18 MCG inhalation capsule Place 1 capsule (18 mcg total) into inhaler and inhale daily.   . nitroGLYCERIN (NITROSTAT) 0.4 MG SL tablet Place 0.4 mg under the tongue every 5 (five) minutes as needed for chest pain (3 DOSES MAX). 07/25/2016: Has not needed  . [DISCONTINUED] carvedilol (COREG) 3.125 MG tablet Take 1 tablet (3.125 mg total) by mouth 2 (two) times daily with a meal.   . [DISCONTINUED] ezetimibe (ZETIA) 10 MG tablet Take 1 tablet (10 mg total) by mouth daily.    No facility-administered encounter medications on file as of 07/25/2016.     Functional Status: In your present state of health, do you have any difficulty performing the following activities: 07/05/2016 06/23/2016  Hearing? Tempie Donning  Vision? N N  Difficulty concentrating or making decisions? N N  Walking or  climbing stairs? Y N  Dressing or bathing? N N  Doing errands, shopping? N N  Preparing Food and eating ? N -  Using the Toilet? N -  In the past six months, have you accidently leaked urine? N -  Do you have problems with loss of bowel control? N -  Managing your Medications? Y -  Managing your Finances? Y -  Housekeeping or managing your Housekeeping? N -  Some recent data might be hidden    Fall/Depression Screening: PHQ 2/9 Scores 07/11/2016 07/05/2016 06/19/2016 05/03/2014 03/01/2014  PHQ - 2 Score 0 0 0 0 0  PHQ- 9 Score 1 - - - -    Assessment: 1.  Medication adherence:  He  is adherent to his medications.  2.  Refills:  He needs refills on ezetimibe, carvedilol, pantoprazole, and levothyroxine.  I have sent request to the providers.   Plan: 1.  I have sent requests to the providers in regards to his medications.  I will follow up to make sure the refills have been sent to the pharmacy by tomorrow evening since he will run out of medication. 2.  I will follow up in 4 weeks to monitor his adherence.  George Robbins has been informed of my plan.    George Robbins, PharmD, [place] [phone]

## 2016-07-25 NOTE — Patient Outreach (Signed)
[facility]) Care Management  [place]  07/25/2016   George Robbins [birth year] [id number]  Subjective: member denies any issues, denies shortness of breath, denies pain. However states he uses rescue inhaler once a day every morning.  Objective: BP 120/82   Pulse 79   Resp 18   SpO2 96% , lungs clear, respirations even unlabored, breath sounds decreased.   Encounter Medications:  Outpatient Encounter Prescriptions as of 07/25/2016  Medication Sig  . acetaminophen (TYLENOL) 325 MG tablet Take 650 mg by mouth every 6 (six) hours as needed for moderate pain.  Marland Kitchen albuterol (PROVENTIL HFA;VENTOLIN HFA) 108 (90 Base) MCG/ACT inhaler Inhale 2 puffs into the lungs every 6 (six) hours as needed for wheezing or shortness of breath. For cough  . aspirin EC 81 MG tablet Take 1 tablet (81 mg total) by mouth daily.  Marland Kitchen atorvastatin (LIPITOR) 40 MG tablet Take 1 tablet (40 mg total) by mouth daily. Patient needs office visit for refills  . carvedilol (COREG) 3.125 MG tablet Take 1 tablet (3.125 mg total) by mouth 2 (two) times daily with a meal.  . clopidogrel (PLAVIX) 75 MG tablet Take 1 tablet (75 mg total) by mouth daily with breakfast. take 1 tablet by mouth once daily WITH BREAKFAS  . ezetimibe (ZETIA) 10 MG tablet Take 1 tablet (10 mg total) by mouth daily.  . fluticasone (FLONASE) 50 MCG/ACT nasal spray instill 2 sprays into each nostril once daily  . levothyroxine (SYNTHROID, LEVOTHROID) 125 MCG tablet Take 1 tablet (125 mcg total) by mouth daily before breakfast.  . loratadine (CLARITIN) 10 MG tablet Take 1 tablet (10 mg total) by mouth daily.  . Multiple Vitamin (MULTIVITAMIN WITH MINERALS) TABS tablet Take 1 tablet by mouth daily.  . nitroGLYCERIN (NITROSTAT) 0.4 MG SL tablet Place 0.4 mg under the tongue every 5 (five) minutes as needed for chest pain (3 DOSES MAX).  Marland Kitchen OVER THE COUNTER MEDICATION Place 1 drop into both eyes daily. Over the counter allergy eye  drop  . pantoprazole (PROTONIX) 40 MG tablet Take 1 tablet (40 mg total) by mouth daily at 6 (six) AM.  . tiotropium (SPIRIVA) 18 MCG inhalation capsule Place 1 capsule (18 mcg total) into inhaler and inhale daily.   No facility-administered encounter medications on file as of 07/25/2016.     Assessment: co-visit with D. Pettus, pharmacist. Also present at visit, member's son(Mahdi Maricela Bo). 65 year old with recent observation admission for atypical chest pain, member has followed up with cardiology-next follow up appointment to be seen in a year. Member has followed up with primary care and pulmonologist. He continues to take his medications as scheduled. He does not have any questions or concern.  RNCM has been focused on COPD education. Video shown/discussed with both member and son. Member's son request resources to get member more active. Son also request transportation resources to be used in case he or member's sister is unable to take member to his appointments. Member's son is also interested in receiving more EMMI material.  Plan: RNCM will make social work referral. Assign more EMMI educational material. RNCM will follow up telephonically in 2-3 weeks.  Thea Silversmith, RN, MSN, [place] Coordinator Cell: [phone]   Plan:

## 2016-07-25 NOTE — Addendum Note (Signed)
Addended by: Gaetano Net on: 07/25/2016 12:47 PM   Modules accepted: Orders

## 2016-07-25 NOTE — Addendum Note (Signed)
Addended by: Gaetano Net on: 07/25/2016 12:48 PM   Modules accepted: Orders

## 2016-07-26 ENCOUNTER — Telehealth: Payer: Self-pay | Admitting: Internal Medicine

## 2016-07-26 ENCOUNTER — Other Ambulatory Visit: Payer: Self-pay | Admitting: Licensed Clinical Social Worker

## 2016-07-26 DIAGNOSIS — I2510 Atherosclerotic heart disease of native coronary artery without angina pectoris: Secondary | ICD-10-CM

## 2016-07-26 NOTE — Patient Outreach (Addendum)
[place]) Care Management  07/26/2016  FABAIN MACHAIN August 05, 1951 [id number]   Assessment- CSW received new referral on patient. CSW completed initial outreach and patient answered. CSW introduced self, reason for call and [facility] social work services. Patient provided HIPPA verifications. Patient reports that he has stable transportation with family but he wishes to have a back up plan for transportation for his medical appointments. CSW educated patient on transportation resources. Patient is agreeable to [employer] referral. Patient understands that this is a free program and that he will be receiving a packet in the mail that he will need to sign and date and put back in return envelope. CSW will mail patient transportation resources in the mail as well. Patient appreciative of this. Patient denies any further social work needs but states that he would like more information on [employer] in [place]. CSW educated patient on the [place] and explained the difference in their program. Patient wishes for this information to be mailed to his residence as well. CSW will mail resources to patient's residence. CSW will also mail complete packet of [employer] for [facility]. Patient denies any further social work needs. CSW will not open case.  Plan-CSW completed referral to [employer] on 07/26/16. CSW mailed transportation and senior center resources to patient's residence on 07/26/16. CSW has sent update to [place].  Eula Fried, BSW, MSW, [email or web address] Phone: [phone] Fax: [phone]

## 2016-07-26 NOTE — Telephone Encounter (Signed)
Pt called the office to speak with nurse regarding his medication. Pt needs refill atorvastatin (LIPITOR) 40 MG tablet. Pt understands that Dr. Janne Napoleon did not prescribe that medication but he can't get in touch with Dr. Irine Seal and [facility] has told him that there's no refill. Please follow up.   Thank you.

## 2016-07-27 ENCOUNTER — Other Ambulatory Visit: Payer: Self-pay | Admitting: Internal Medicine

## 2016-07-27 MED ORDER — LEVOTHYROXINE SODIUM 125 MCG PO TABS: 125.0000 ug | ORAL_TABLET | Freq: Every day | ORAL | 2 refills | Status: DC

## 2016-07-27 MED ORDER — ATORVASTATIN CALCIUM 40 MG PO TABS
40.0000 mg | ORAL_TABLET | Freq: Every day | ORAL | 0 refills | Status: DC
Start: 2016-07-27 — End: 2017-01-08

## 2016-07-27 MED ORDER — PANTOPRAZOLE SODIUM 40 MG PO TBEC: 40.0000 mg | DELAYED_RELEASE_TABLET | Freq: Every day | ORAL | 1 refills | Status: DC

## 2016-07-27 NOTE — Telephone Encounter (Signed)
Will forward to stacey 

## 2016-07-27 NOTE — Telephone Encounter (Signed)
Refilled atorvastatin x 30 days to allow him time to follow up with original prescriber.

## 2016-08-13 ENCOUNTER — Other Ambulatory Visit: Payer: Self-pay

## 2016-08-13 NOTE — Patient Outreach (Signed)
[facility]) Care Management  08/13/2016  George Robbins 06-08-1951 [id number]  RNCM called to follow up to assess if there are any additional care management needs. No answer. HIPPA compliant message left.  Plan: await return call and follow call up in 1-2 weeks.  Thea Silversmith, RN, MSN, [place] Coordinator Cell: [phone]

## 2016-08-16 ENCOUNTER — Other Ambulatory Visit: Payer: Self-pay

## 2016-08-16 NOTE — Patient Outreach (Signed)
[facility]) Care Management  08/16/2016  TYJAY NYBORG 1951/05/16 [id number]   RNCM called to follow up and assess is any additional care coordination needs. No answer. HIPPA compliant message left.  Plan: continue to attempt to reach member.  Thea Silversmith, RN, MSN, [place] Coordinator Cell: [phone]

## 2016-08-21 ENCOUNTER — Other Ambulatory Visit: Payer: Self-pay

## 2016-08-21 NOTE — Patient Outreach (Signed)
[facility]) Care Management  08/21/2016  George Robbins [birth year] [id number]   RNCM called to follow up. No answer. RNCM called x3 no answer.    Plan: send Outreach letter.  Thea Silversmith, RN, MSN, [place] Coordinator Cell: [phone]

## 2016-08-22 ENCOUNTER — Other Ambulatory Visit: Payer: Self-pay

## 2016-08-22 NOTE — Patient Outreach (Signed)
[facility]) Care Management  [place]   08/22/2016  CHAS AXEL [birth year] [id number]  Subjective: [name] is 65 year old male who I have been seeing for medication adherence.  His sister is not able to be at the home visit today with him since she is caring for their mother.  She would like for me to call her after we are done.  Mr. [name] is concerned that he was not able to get the pantoprazole and that he does not have another prescription for atorvastatin.  I called [facility] and he had a prescription for pantoprazole that was called in on 07/27/16 and a prescription for Atorvastatin that was called in on 06/19/16 from Dr. Janne Napoleon on file.  They were able to fill both of them.  He has been 100% adherent to his medications.  He was able to fill his pill box on his own and did it correctly.  He was able to look at the pills in the other pill box and match them.  I congratulated him on a job well done.  He stated he missed his appointment with Dr. Janne Napoleon this week.  I asked him if he knew that [name] was trying to reach him.  He did not realize that it was [name] that was calling him.    Objective:   Encounter Medications: Outpatient Encounter Prescriptions as of 08/22/2016  Medication Sig Note  . acetaminophen (TYLENOL) 325 MG tablet Take 650 mg by mouth every 6 (six) hours as needed for moderate pain.   Marland Kitchen albuterol (PROVENTIL HFA;VENTOLIN HFA) 108 (90 Base) MCG/ACT inhaler Inhale 2 puffs into the lungs every 6 (six) hours as needed for wheezing or shortness of breath. For cough   . aspirin EC 81 MG tablet Take 1 tablet (81 mg total) by mouth daily.   Marland Kitchen atorvastatin (LIPITOR) 40 MG tablet Take 1 tablet (40 mg total) by mouth daily.   . carvedilol (COREG) 3.125 MG tablet Take 1 tablet (3.125 mg total) by mouth 2 (two) times daily with a meal.   . clopidogrel (PLAVIX) 75 MG tablet Take 1 tablet (75 mg total) by mouth daily with breakfast. take  1 tablet by mouth once daily WITH BREAKFAS   . ezetimibe (ZETIA) 10 MG tablet Take 1 tablet (10 mg total) by mouth daily.   . fluticasone (FLONASE) 50 MCG/ACT nasal spray instill 2 sprays into each nostril once daily   . levothyroxine (SYNTHROID, LEVOTHROID) 125 MCG tablet Take 1 tablet (125 mcg total) by mouth daily before breakfast.   . loratadine (CLARITIN) 10 MG tablet Take 1 tablet (10 mg total) by mouth daily.   . Multiple Vitamin (MULTIVITAMIN WITH MINERALS) TABS tablet Take 1 tablet by mouth daily.   . nitroGLYCERIN (NITROSTAT) 0.4 MG SL tablet Place 0.4 mg under the tongue every 5 (five) minutes as needed for chest pain (3 DOSES MAX). 07/25/2016: Has not needed  . OVER THE COUNTER MEDICATION Place 1 drop into both eyes daily. Over the counter allergy eye drop   . pantoprazole (PROTONIX) 40 MG tablet Take 1 tablet (40 mg total) by mouth daily.   Marland Kitchen tiotropium (SPIRIVA) 18 MCG inhalation capsule Place 1 capsule (18 mcg total) into inhaler and inhale daily.    No facility-administered encounter medications on file as of 08/22/2016.     Functional Status: In your present state of health, do you have any difficulty performing the following activities: 07/05/2016 06/23/2016  Hearing? Tempie Donning  Vision? N N  Difficulty concentrating or making decisions? N N  Walking or climbing stairs? Y N  Dressing or bathing? N N  Doing errands, shopping? N N  Preparing Food and eating ? N -  Using the Toilet? N -  In the past six months, have you accidently leaked urine? N -  Do you have problems with loss of bowel control? N -  Managing your Medications? Y -  Managing your Finances? Y -  Housekeeping or managing your Housekeeping? N -  Some recent data might be hidden    Fall/Depression Screening: PHQ 2/9 Scores 07/11/2016 07/05/2016 06/19/2016 05/03/2014 03/01/2014  PHQ - 2 Score 0 0 0 0 0  PHQ- 9 Score 1 - - - -    Assessment: Medication adherence:  He is 100% adherent to his medications.   Plan: 1.   [name] is doing a wonderful job staying adherent to his medications. He is filling his pill box on his own when he has example to follow.  2.  I explain that [name] was trying to reach and recommended that he call her back.  I showed him which number was her's on his phone.   3.  I recommended he make a follow up appointment with Dr. Janne Napoleon to since he stated he missed his appointment with her on Monday.   4.  I am going to close his case to pharmacy since all of his pharmacy needs have been met.  I am happy to meet with him in the future if any pharmacy needs come up again.    Deanne Coffer, PharmD, [place] [phone]

## 2016-08-31 ENCOUNTER — Other Ambulatory Visit: Payer: Self-pay

## 2016-08-31 NOTE — Patient Outreach (Signed)
[facility] Care Management  08/31/16  George Robbins 07-23-1951 [id number]  RNCM covering for Thea Silversmith, patient's primary RNCM. [name] had previously made 3 unsuccessful attempts to reach patient and sent outreach letter on 08/21/2016.  RNCM made one additional attempt to reach patient today without success. Left HIPAA compliant voicemail with RNCM contact information and invited callback.  [place] R. Tarance Balan, RN, BSN, [place] Management Coordinator [phone]

## 2016-09-04 ENCOUNTER — Ambulatory Visit: Payer: Medicare Other | Attending: Internal Medicine | Admitting: Internal Medicine

## 2016-09-04 ENCOUNTER — Encounter: Payer: Self-pay | Admitting: Internal Medicine

## 2016-09-04 ENCOUNTER — Ambulatory Visit (HOSPITAL_COMMUNITY)
Admission: RE | Admit: 2016-09-04 | Discharge: 2016-09-04 | Disposition: A | Discharge status: Home / Self Care | Payer: Medicare Other | Source: Ambulatory Visit | Attending: Internal Medicine | Admitting: Internal Medicine

## 2016-09-04 VITALS — BP 130/86 | HR 84 | Temp 98.2°F | Resp 16 | Wt 189.0 lb

## 2016-09-04 DIAGNOSIS — Z79899 Other long term (current) drug therapy: Secondary | ICD-10-CM | POA: Insufficient documentation

## 2016-09-04 DIAGNOSIS — G8929 Other chronic pain: Secondary | ICD-10-CM | POA: Insufficient documentation

## 2016-09-04 DIAGNOSIS — E039 Hypothyroidism, unspecified: Secondary | ICD-10-CM | POA: Insufficient documentation

## 2016-09-04 DIAGNOSIS — M545 Low back pain, unspecified: Secondary | ICD-10-CM

## 2016-09-04 DIAGNOSIS — N183 Chronic kidney disease, stage 3 (moderate): Secondary | ICD-10-CM | POA: Diagnosis not present

## 2016-09-04 DIAGNOSIS — I700 Atherosclerosis of aorta: Secondary | ICD-10-CM | POA: Diagnosis not present

## 2016-09-04 DIAGNOSIS — I2510 Atherosclerotic heart disease of native coronary artery without angina pectoris: Secondary | ICD-10-CM | POA: Diagnosis not present

## 2016-09-04 DIAGNOSIS — M79606 Pain in leg, unspecified: Secondary | ICD-10-CM

## 2016-09-04 DIAGNOSIS — Z7982 Long term (current) use of aspirin: Secondary | ICD-10-CM | POA: Diagnosis not present

## 2016-09-04 DIAGNOSIS — I200 Unstable angina: Secondary | ICD-10-CM | POA: Diagnosis not present

## 2016-09-04 DIAGNOSIS — M5136 Other intervertebral disc degeneration, lumbar region: Secondary | ICD-10-CM | POA: Insufficient documentation

## 2016-09-04 DIAGNOSIS — I129 Hypertensive chronic kidney disease with stage 1 through stage 4 chronic kidney disease, or unspecified chronic kidney disease: Secondary | ICD-10-CM | POA: Diagnosis not present

## 2016-09-04 DIAGNOSIS — Z23 Encounter for immunization: Secondary | ICD-10-CM | POA: Insufficient documentation

## 2016-09-04 DIAGNOSIS — M542 Cervicalgia: Secondary | ICD-10-CM | POA: Diagnosis not present

## 2016-09-04 DIAGNOSIS — K219 Gastro-esophageal reflux disease without esophagitis: Secondary | ICD-10-CM | POA: Diagnosis not present

## 2016-09-04 LAB — TSH: TSH: 2.15 mIU/L (ref 0.40–4.50)

## 2016-09-04 LAB — T4, FREE: FREE T4: 1.2 ng/dL (ref 0.8–1.8)

## 2016-09-04 MED ORDER — BUDESONIDE-FORMOTEROL FUMARATE 80-4.5 MCG/ACT IN AERO: 2.0000 | INHALATION_SPRAY | Freq: Two times a day (BID) | RESPIRATORY_TRACT | 3 refills | Status: DC

## 2016-09-04 MED ORDER — DICLOFENAC SODIUM 1 % TD GEL: 2.0000 g | Freq: Four times a day (QID) | TRANSDERMAL | 2 refills | Status: DC

## 2016-09-04 NOTE — Progress Notes (Signed)
Pt is in the office today for back pain Pt states his pain level today is a 10 Pt states he didn't have an injury Pt son states his legs have been giving his issues Pt states he has been taking tylenol and they haven't be helping Pt sons states some days it's hard for him to walk

## 2016-09-04 NOTE — Patient Instructions (Addendum)
Health Maintenance, Male A healthy lifestyle and preventative care can promote health and wellness.  Maintain regular health, dental, and eye exams.  Eat a healthy diet. Foods like vegetables, fruits, whole grains, low-fat dairy products, and lean protein foods contain the nutrients you need and are low in calories. Decrease your intake of foods high in solid fats, added sugars, and salt. Get information about a proper diet from your health care provider, if necessary.  Regular physical exercise is one of the most important things you can do for your health. Most adults should get at least 150 minutes of moderate-intensity exercise (any activity that increases your heart rate and causes you to sweat) each week. In addition, most adults need muscle-strengthening exercises on 2 or more days a week.   Maintain a healthy weight. The body mass index (BMI) is a screening tool to identify possible weight problems. It provides an estimate of body fat based on height and weight. Your health care provider can find your BMI and can help you achieve or maintain a healthy weight. For males 20 years and older:  A BMI below 18.5 is considered underweight.  A BMI of 18.5 to 24.9 is normal.  A BMI of 25 to 29.9 is considered overweight.  A BMI of 30 and above is considered obese.  Maintain normal blood lipids and cholesterol by exercising and minimizing your intake of saturated fat. Eat a balanced diet with plenty of fruits and vegetables. Blood tests for lipids and cholesterol should begin at age 20 and be repeated every 5 years. If your lipid or cholesterol levels are high, you are over age 50, or you are at high risk for heart disease, you may need your cholesterol levels checked more frequently.Ongoing high lipid and cholesterol levels should be treated with medicines if diet and exercise are not working.  If you smoke, find out from your health care provider how to quit. If you do not use tobacco, do not  start.  Lung cancer screening is recommended for adults aged 55-80 years who are at high risk for developing lung cancer because of a history of smoking. A yearly low-dose CT scan of the lungs is recommended for people who have at least a 30-pack-year history of smoking and are current smokers or have quit within the past 15 years. A pack year of smoking is smoking an average of 1 pack of cigarettes a day for 1 year (for example, a 30-pack-year history of smoking could mean smoking 1 pack a day for 30 years or 2 packs a day for 15 years). Yearly screening should continue until the smoker has stopped smoking for at least 15 years. Yearly screening should be stopped for people who develop a health problem that would prevent them from having lung cancer treatment.  If you choose to drink alcohol, do not have more than 2 drinks per day. One drink is considered to be 12 oz (360 mL) of beer, 5 oz (150 mL) of wine, or 1.5 oz (45 mL) of liquor.  Avoid the use of street drugs. Do not share needles with anyone. Ask for help if you need support or instructions about stopping the use of drugs.  High blood pressure causes heart disease and increases the risk of stroke. High blood pressure is more likely to develop in:  People who have blood pressure in the end of the normal range (100-139/85-89 mm Hg).  People who are overweight or obese.  People who are African American.    If you are 18-39 years of age, have your blood pressure checked every 3-5 years. If you are 40 years of age or older, have your blood pressure checked every year. You should have your blood pressure measured twice--once when you are at a hospital or clinic, and once when you are not at a hospital or clinic. Record the average of the two measurements. To check your blood pressure when you are not at a hospital or clinic, you can use:  An automated blood pressure machine at a pharmacy.  A home blood pressure monitor.  If you are 45-79 years  old, ask your health care provider if you should take aspirin to prevent heart disease.  Diabetes screening involves taking a blood sample to check your fasting blood sugar level. This should be done once every 3 years after age 45 if you are at a normal weight and without risk factors for diabetes. Testing should be considered at a younger age or be carried out more frequently if you are overweight and have at least 1 risk factor for diabetes.  Colorectal cancer can be detected and often prevented. Most routine colorectal cancer screening begins at the age of 50 and continues through age 75. However, your health care provider may recommend screening at an earlier age if you have risk factors for colon cancer. On a yearly basis, your health care provider may provide home test kits to check for hidden blood in the stool. A small camera at the end of a tube may be used to directly examine the colon (sigmoidoscopy or colonoscopy) to detect the earliest forms of colorectal cancer. Talk to your health care provider about this at age 50 when routine screening begins. A direct exam of the colon should be repeated every 5-10 years through age 75, unless early forms of precancerous polyps or small growths are found.  People who are at an increased risk for hepatitis B should be screened for this virus. You are considered at high risk for hepatitis B if:  You were born in a country where hepatitis B occurs often. Talk with your health care provider about which countries are considered high risk.  Your parents were born in a high-risk country and you have not received a shot to protect against hepatitis B (hepatitis B vaccine).  You have HIV or AIDS.  You use needles to inject street drugs.  You live with, or have sex with, someone who has hepatitis B.  You are a man who has sex with other men (MSM).  You get hemodialysis treatment.  You take certain medicines for conditions like cancer, organ  transplantation, and autoimmune conditions.  Hepatitis C blood testing is recommended for all people born from 1945 through 1965 and any individual with known risk factors for hepatitis C.  Healthy men should no longer receive prostate-specific antigen (PSA) blood tests as part of routine cancer screening. Talk to your health care provider about prostate cancer screening.  Testicular cancer screening is not recommended for adolescents or adult males who have no symptoms. Screening includes self-exam, a health care provider exam, and other screening tests. Consult with your health care provider about any symptoms you have or any concerns you have about testicular cancer.  Practice safe sex. Use condoms and avoid high-risk sexual practices to reduce the spread of sexually transmitted infections (STIs).  You should be screened for STIs, including gonorrhea and chlamydia if:  You are sexually active and are younger than 24 years.  You   are older than 24 years, and your health care provider tells you that you are at risk for this type of infection.  Your sexual activity has changed since you were last screened, and you are at an increased risk for chlamydia or gonorrhea. Ask your health care provider if you are at risk.  If you are at risk of being infected with HIV, it is recommended that you take a prescription medicine daily to prevent HIV infection. This is called pre-exposure prophylaxis (PrEP). You are considered at risk if:  You are a man who has sex with other men (MSM).  You are a heterosexual man who is sexually active with multiple partners.  You take drugs by injection.  You are sexually active with a partner who has HIV.  Talk with your health care provider about whether you are at high risk of being infected with HIV. If you choose to begin PrEP, you should first be tested for HIV. You should then be tested every 3 months for as long as you are taking PrEP.  Use sunscreen. Apply  sunscreen liberally and repeatedly throughout the day. You should seek shade when your shadow is shorter than you. Protect yourself by wearing long sleeves, pants, a wide-brimmed hat, and sunglasses year round whenever you are outdoors.  Tell your health care provider of new moles or changes in moles, especially if there is a change in shape or color. Also, tell your health care provider if a mole is larger than the size of a pencil eraser.  A one-time screening for abdominal aortic aneurysm (AAA) and surgical repair of large AAAs by ultrasound is recommended for men aged 4-75 years who are current or former smokers.  Stay current with your vaccines (immunizations).   This information is not intended to replace advice given to you by your health care provider. Make sure you discuss any questions you have with your health care provider.   Document Released: 05/10/2008 Document Revised: 12/03/2014 Document Reviewed: 04/09/2011 Elsevier Interactive Patient Education 2016 Elsevier Inc. Influenza Virus Vaccine injection (Fluarix) What is this medicine? INFLUENZA VIRUS VACCINE (in floo EN zuh VAHY ruhs vak SEEN) helps to reduce the risk of getting influenza also known as the flu. This medicine may be used for other purposes; ask your health care provider or pharmacist if you have questions. What should I tell my health care provider before I take this medicine? They need to know if you have any of these conditions: -bleeding disorder like hemophilia -fever or infection -Guillain-Barre syndrome or other neurological problems -immune system problems -infection with the human immunodeficiency virus (HIV) or AIDS -low blood platelet counts -multiple sclerosis -an unusual or allergic reaction to influenza virus vaccine, eggs, chicken proteins, latex, gentamicin, other medicines, foods, dyes or preservatives -pregnant or trying to get pregnant -breast-feeding How should I use this medicine? This  vaccine is for injection into a muscle. It is given by a health care professional. A copy of Vaccine Information Statements will be given before each vaccination. Read this sheet carefully each time. The sheet may change frequently. Talk to your pediatrician regarding the use of this medicine in children. Special care may be needed. Overdosage: If you think you have taken too much of this medicine contact a poison control center or emergency room at once. NOTE: This medicine is only for you. Do not share this medicine with others. What if I miss a dose? This does not apply. What may interact with this medicine? -chemotherapy or  radiation therapy -medicines that lower your immune system like etanercept, anakinra, infliximab, and adalimumab -medicines that treat or prevent blood clots like warfarin -phenytoin -steroid medicines like prednisone or cortisone -theophylline -vaccines This list may not describe all possible interactions. Give your health care provider a list of all the medicines, herbs, non-prescription drugs, or dietary supplements you use. Also tell them if you smoke, drink alcohol, or use illegal drugs. Some items may interact with your medicine. What should I watch for while using this medicine? Report any side effects that do not go away within 3 days to your doctor or health care professional. Call your health care provider if any unusual symptoms occur within 6 weeks of receiving this vaccine. You may still catch the flu, but the illness is not usually as bad. You cannot get the flu from the vaccine. The vaccine will not protect against colds or other illnesses that may cause fever. The vaccine is needed every year. What side effects may I notice from receiving this medicine? Side effects that you should report to your doctor or health care professional as soon as possible: -allergic reactions like skin rash, itching or hives, swelling of the face, lips, or tongue Side effects  that usually do not require medical attention (report to your doctor or health care professional if they continue or are bothersome): -fever -headache -muscle aches and pains -pain, tenderness, redness, or swelling at site where injected -weak or tired This list may not describe all possible side effects. Call your doctor for medical advice about side effects. You may report side effects to FDA at 1-800-FDA-1088. Where should I keep my medicine? This vaccine is only given in a clinic, pharmacy, doctor's office, or other health care setting and will not be stored at home. NOTE: This sheet is a summary. It may not cover all possible information. If you have questions about this medicine, talk to your doctor, pharmacist, or health care provider.    2016, Elsevier/Gold Standard. (2008-06-09 09:30:40)
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
## 2016-09-05 ENCOUNTER — Telehealth: Payer: Self-pay

## 2016-09-05 NOTE — Telephone Encounter (Signed)
Contacted pt to go over lab results. Pt gave me permission to speak with his sister. Pt sister is aware of the results and they would like the 2 referrals placed for the pain doctor and physical therapy

## 2016-09-15 ENCOUNTER — Other Ambulatory Visit: Payer: Self-pay | Admitting: Internal Medicine

## 2016-09-15 DIAGNOSIS — M47816 Spondylosis without myelopathy or radiculopathy, lumbar region: Secondary | ICD-10-CM

## 2016-09-15 NOTE — Telephone Encounter (Signed)
Please call and tell them I put in referrals for pain and PT. May take 1-2 wks to hear from referral specialist. thanks

## 2016-10-04 ENCOUNTER — Encounter: Payer: Self-pay | Admitting: Emergency Medicine

## 2016-10-04 ENCOUNTER — Ambulatory Visit (INDEPENDENT_AMBULATORY_CARE_PROVIDER_SITE_OTHER): Payer: Medicare Other | Admitting: Emergency Medicine

## 2016-10-04 DIAGNOSIS — J438 Other emphysema: Secondary | ICD-10-CM

## 2016-10-04 DIAGNOSIS — I200 Unstable angina: Secondary | ICD-10-CM

## 2016-10-04 NOTE — Assessment & Plan Note (Signed)
He does not appear to be significantly limited with regard to his exertion. He does have to pace himself. He has not been on Spiriva for several weeks, he can't remember exactly how long. He ran out of it and did not refill it. He that Symbicort was started about a month ago but he also did not fill this medication. I would like to hold off on the Symbicort for now, restart the Spiriva. Depending on how he does when he is actually compliant with his medication we can decide whether to add the Symbicort to his regimen. Flu shot up-to-date

## 2016-10-04 NOTE — Patient Instructions (Signed)
Please restart your Spiriva 1 inhalation daily Take albuterol 2 puffs up to every 4 hours if needed for shortness of breath.  Do not start Symbicort for now. We will decide whether to start this in the future depending on how you are doing on the Spiriva.  Follow with Dr Lamonte Sakai in 6 months or sooner if you have any problems

## 2016-10-04 NOTE — Progress Notes (Signed)
Subjective:    Patient ID: George Robbins, male    DOB: May 26, 1951, 65 y.o.   MRN: [id number]  Cough  Associated symptoms include shortness of breath. Pertinent negatives include no postnasal drip or rhinorrhea.   65 yo man, former smoker (30 pk-yrs), history of CAD s/p PTCI, GERD, hypertension, hypothyroidism, chronic kidney disease, prior traumatic pneumothorax. He has a history of chronic cough. Also noted to have mixed obstruction and restriction on pulmonary function testing  ROV 06/10/15 -- follow-up visit for mixed obstruction and restriction, chronic cough. He has been maintained on Spiriva and albuterol as needed. Since our last visit he underwent a modified barium swallow and I personally reviewed the results today. He had minimal to mild pharyngeal dysphagia and a delayed swallow initiation without any obvious laryngeal penetration or aspiration. He was started on swallowing and reflux precautions and a normal diet. His cough is a bit better but still happens w meals. He has been reliable with his Spiriva.  Needs refill on albuterol and nasal spray  ROV 64/29/16 -- follow-up visit for chronic cough, history of traumatic pneumothorax, GERD, CAD. He has mixed disease on spirometry from 06/09/14 that I personally reviewed today. There has been some question of delayed swallow in the past but no obvious aspiration on barium swallow. He has had flaring of his chronic cough in September and was seen by Dr Melvyn Novas and TP.  A trial off his Spiriva was performed at that time. He is having some increased SOB, especially up hills. He states that he is taking his loratadine, fluticasone nasal spray. He is on pantoprazole once a day.  He uses albuterol many mornings. His cough is certainly less than last visit.   ROV 64/19/17 -- Patient has a history of chronic cough, chronic traumatic pneumothorax, GERD, hypertension, coronary disease. He has mixed obstruction and restriction on pulmonary function  testing. He's been seen twice in our office since my last visit with him. On one occasion he had been unable to obtain his Spiriva. He was seen in April for a discomfort in his chest that was making it difficult for him to take a deep breath. He had CT scan of his abdomen on 03/20/16 that showed no intra-abdominal abnormality but some left lower lobe atelectasis. He is on protonix bid, flonase. He tells me today that he has been feeling bad > inability to take a deep breath, has has to stop with hills and extended walking, taking trash to road. He has albuterol that he is using qd. He has been off spiriva since April sample ran out. He also has run out of synthroid and atorvastatin.   ROV 64/8/17 -- Patient With mixed obstruction and restriction on pulmonary function testing, history of chronic cough, a remote traumatic pneumothorax, GERD, hypertension and coronary disease. At his last visit his dyspnea appeared to be worse. He had stopped his Spiriva after running out. Last visit I changed him back to the Spiriva [place]er >  He feels that his dyspnea is a bit better. He was hospitalized end of July for chest pain and dyspnea. He has cardiac stress testing scheduled for 07/04/16. It was discovered that his TSH was > 90! He had been off synthroid for several months.   He has albuterol, uses it about once a day. He is on loratadine, protonix, nasal steroid. His exercise tolerance is limited, he has to stop when walking only a few feet.  ROV 64/9/17 -- This is a f/u visit for pt with a hx COPD, restriction on PFT. Also with remote traumatic PTX, HTN, CAD, hypothyroidism (untreated at our last visit).  Currently compliant with his synthroid. He used spiriva for a few months, ran out and has not refilled. He stats that he was started on Symbicort 160 last month, although he hasn't picked it up or started it.                                                                                                                                No flowsheet data found.   Review of Systems  HENT: Negative for postnasal drip and rhinorrhea.   Respiratory: Positive for cough and shortness of breath.        Objective:   Physical Exam  Vitals:   10/04/16 0911 10/04/16 0912  BP:  120/86  Pulse:  93  SpO2:  92%  Weight: 86.6 kg (191 lb)   Height: 5\' 4"  (1.626 m)    Gen: Pleasant, well-nourished, in no distress,  normal affect  ENT: No lesions,  mouth clear,  oropharynx clear, no postnasal drip  Neck: No JVD, no TMG, no carotid bruits  Lungs: No use of accessory muscles, clear without rales or rhonchi  Cardiovascular: RRR, heart sounds normal, no murmur or gallops, no peripheral edema  Musculoskeletal: No deformities, no cyanosis or clubbing  Neuro: alert, non focal  Skin: Warm, no lesions or rashes       Assessment & Plan:  COPD (chronic obstructive pulmonary disease) (HCC) He does not appear to be significantly limited with regard to his exertion. He does have to pace himself. He has not been on Spiriva for several weeks, he can't remember exactly how long. He ran out of it and did not refill it. He that Symbicort was started about a month ago but he also did not fill this medication. I would like to hold off on the Symbicort for now, restart the Spiriva. Depending on how he does when he is actually compliant with his medication we can decide whether to add the Symbicort to his regimen. Flu shot up-to-date  Baltazar Apo, MD, PhD 10/04/2016, 9:28 AM [facility] [phone] or if no answer [phone]

## 2016-10-29 ENCOUNTER — Other Ambulatory Visit: Payer: Self-pay | Admitting: Internal Medicine

## 2016-10-29 MED ORDER — ALBUTEROL SULFATE HFA 108 (90 BASE) MCG/ACT IN AERS: 2.0000 | INHALATION_SPRAY | Freq: Four times a day (QID) | RESPIRATORY_TRACT | 2 refills | Status: DC | PRN

## 2016-10-29 MED ORDER — TIOTROPIUM BROMIDE MONOHYDRATE 18 MCG IN CAPS: 18.0000 ug | ORAL_CAPSULE | Freq: Every day | RESPIRATORY_TRACT | 3 refills | Status: DC

## 2016-11-24 ENCOUNTER — Encounter (HOSPITAL_COMMUNITY): Payer: Self-pay | Admitting: Emergency Medicine

## 2016-11-24 ENCOUNTER — Emergency Department (HOSPITAL_COMMUNITY): Payer: Medicare Other

## 2016-11-24 ENCOUNTER — Emergency Department (HOSPITAL_COMMUNITY)
Admission: EM | Admit: 2016-11-24 | Discharge: 2016-11-25 | Disposition: A | Discharge status: Home / Self Care | Payer: Medicare Other | Attending: Emergency Medicine | Admitting: Emergency Medicine

## 2016-11-24 DIAGNOSIS — Z7982 Long term (current) use of aspirin: Secondary | ICD-10-CM | POA: Diagnosis not present

## 2016-11-24 DIAGNOSIS — J449 Chronic obstructive pulmonary disease, unspecified: Secondary | ICD-10-CM | POA: Insufficient documentation

## 2016-11-24 DIAGNOSIS — R0602 Shortness of breath: Secondary | ICD-10-CM | POA: Diagnosis not present

## 2016-11-24 DIAGNOSIS — I25119 Atherosclerotic heart disease of native coronary artery with unspecified angina pectoris: Secondary | ICD-10-CM | POA: Insufficient documentation

## 2016-11-24 DIAGNOSIS — K279 Peptic ulcer, site unspecified, unspecified as acute or chronic, without hemorrhage or perforation: Secondary | ICD-10-CM

## 2016-11-24 DIAGNOSIS — R112 Nausea with vomiting, unspecified: Secondary | ICD-10-CM | POA: Diagnosis not present

## 2016-11-24 DIAGNOSIS — Z87891 Personal history of nicotine dependence: Secondary | ICD-10-CM | POA: Insufficient documentation

## 2016-11-24 DIAGNOSIS — R1110 Vomiting, unspecified: Secondary | ICD-10-CM

## 2016-11-24 DIAGNOSIS — I129 Hypertensive chronic kidney disease with stage 1 through stage 4 chronic kidney disease, or unspecified chronic kidney disease: Secondary | ICD-10-CM | POA: Diagnosis not present

## 2016-11-24 DIAGNOSIS — E039 Hypothyroidism, unspecified: Secondary | ICD-10-CM | POA: Diagnosis not present

## 2016-11-24 DIAGNOSIS — Z79899 Other long term (current) drug therapy: Secondary | ICD-10-CM | POA: Diagnosis not present

## 2016-11-24 DIAGNOSIS — K529 Noninfective gastroenteritis and colitis, unspecified: Secondary | ICD-10-CM

## 2016-11-24 DIAGNOSIS — N183 Chronic kidney disease, stage 3 (moderate): Secondary | ICD-10-CM | POA: Diagnosis not present

## 2016-11-24 DIAGNOSIS — R197 Diarrhea, unspecified: Secondary | ICD-10-CM | POA: Diagnosis present

## 2016-11-24 LAB — BASIC METABOLIC PANEL
ANION GAP: 11 (ref 5–15)
BUN: 8 mg/dL (ref 6–20)
CALCIUM: 10.5 mg/dL — AB (ref 8.9–10.3)
CO2: 26 mmol/L (ref 22–32)
Chloride: 103 mmol/L (ref 101–111)
Creatinine, Ser: 1.17 mg/dL (ref 0.61–1.24)
GFR calc Af Amer: >60 mL/min (ref >60)
Glucose, Bld: 99 mg/dL (ref 65–99)
Potassium: 4 mmol/L (ref 3.5–5.1)
SODIUM: 140 mmol/L (ref 135–145)

## 2016-11-24 LAB — HEPATIC FUNCTION PANEL
ALBUMIN: 4.1 g/dL (ref 3.5–5.0)
ALT: 47 U/L (ref 17–63)
AST: 31 U/L (ref 15–41)
Alkaline Phosphatase: 104 U/L (ref 38–126)
Bilirubin, Direct: <0.1 mg/dL — ABNORMAL LOW (ref 0.1–0.5)
TOTAL PROTEIN: 7.5 g/dL (ref 6.5–8.1)
Total Bilirubin: 0.6 mg/dL (ref 0.3–1.2)

## 2016-11-24 LAB — CBC
HCT: 42.3 % (ref 39.0–52.0)
HEMOGLOBIN: 14.8 g/dL (ref 13.0–17.0)
MCH: 29 pg (ref 26.0–34.0)
MCHC: 35 g/dL (ref 30.0–36.0)
MCV: 82.9 fL (ref 78.0–100.0)
Platelets: 178 10*3/uL (ref 150–400)
RBC: 5.1 MIL/uL (ref 4.22–5.81)
RDW: 14.2 % (ref 11.5–15.5)
WBC: 12.8 10*3/uL — AB (ref 4.0–10.5)

## 2016-11-24 LAB — I-STAT TROPONIN, ED: TROPONIN I, POC: 0 ng/mL (ref 0.00–0.08)

## 2016-11-24 LAB — LIPASE, BLOOD: LIPASE: 26 U/L (ref 11–51)

## 2016-11-24 MED ORDER — ONDANSETRON 4 MG PO TBDP: 4.0000 mg | ORAL_TABLET | Freq: Three times a day (TID) | ORAL | 0 refills | Status: DC | PRN

## 2016-11-24 MED ORDER — SUCRALFATE 1 G PO TABS: 1.0000 g | ORAL_TABLET | Freq: Three times a day (TID) | ORAL | 0 refills | Status: DC

## 2016-11-24 MED ORDER — ONDANSETRON 4 MG PO TBDP
4.0000 mg | ORAL_TABLET | Freq: Once | ORAL | Status: DC
  Filled 2016-11-24: qty 1

## 2016-11-24 NOTE — ED Notes (Signed)
Patient transported to X-ray 

## 2016-11-24 NOTE — Discharge Instructions (Signed)
Please continue to take her Protonix as prescribed. Start taking the Carafate as well. Avoid spicy, fried, citrus foods and stick to bland foods until her symptoms start to feel better. Dear best to maintain a oral hydration. If he become nauseous he may take Zofran every 8 hours. If your symptoms do not resolve in the next week please follow-up with your PCP. You may need a gallbladder ultrasound ordered at that time if you're symptoms continue.

## 2016-11-24 NOTE — ED Provider Notes (Signed)
Presents with abdominal pain, diffuse intermittent for the past 5 days with vomiting and diarrhea. Denies fever. Denies shortness of breath No hematemesis or blood per rectum Presently he denies any symptoms. Denies pain he is presently hungry. On exam alert and in no distress heart regular rate and rhythm lungs clear auscultation abdomen nondistended, nontender, midline surgical scar. Genitalia normal male   Orlie Dakin, MD 11/25/16 [phone]

## 2016-11-24 NOTE — ED Notes (Signed)
Dong MD at bedside

## 2016-11-24 NOTE — ED Triage Notes (Signed)
Pt to ED with  Multiple complaints-- sister with pt, states has been throwing up "for awhile" pt called sister to bring him here because of increased shortness of breath. Pt does not know if he had a fever, but states he did have diarrhea this am. C/o sharp pains in lower back, generalized weakness

## 2016-11-24 NOTE — ED Notes (Signed)
Provided PO challenge, pt passed. Offered sandwich.
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
## 2016-11-25 NOTE — ED Notes (Signed)
Provided discharge instructions at this time, pt denied questions.

## 2017-01-08 ENCOUNTER — Other Ambulatory Visit: Payer: Self-pay | Admitting: Internal Medicine

## 2017-01-08 ENCOUNTER — Encounter: Payer: Self-pay | Admitting: Internal Medicine

## 2017-01-08 ENCOUNTER — Ambulatory Visit: Payer: Medicare Other | Attending: Internal Medicine | Admitting: Internal Medicine

## 2017-01-08 VITALS — BP 123/84 | HR 85 | Temp 98.0°F | Resp 16 | Wt 188.8 lb

## 2017-01-08 DIAGNOSIS — I10 Essential (primary) hypertension: Secondary | ICD-10-CM

## 2017-01-08 DIAGNOSIS — E785 Hyperlipidemia, unspecified: Secondary | ICD-10-CM | POA: Diagnosis not present

## 2017-01-08 DIAGNOSIS — Z79899 Other long term (current) drug therapy: Secondary | ICD-10-CM | POA: Insufficient documentation

## 2017-01-08 DIAGNOSIS — I209 Angina pectoris, unspecified: Secondary | ICD-10-CM

## 2017-01-08 DIAGNOSIS — Z7982 Long term (current) use of aspirin: Secondary | ICD-10-CM | POA: Diagnosis not present

## 2017-01-08 DIAGNOSIS — I2510 Atherosclerotic heart disease of native coronary artery without angina pectoris: Secondary | ICD-10-CM | POA: Diagnosis not present

## 2017-01-08 DIAGNOSIS — K279 Peptic ulcer, site unspecified, unspecified as acute or chronic, without hemorrhage or perforation: Secondary | ICD-10-CM

## 2017-01-08 DIAGNOSIS — Z888 Allergy status to other drugs, medicaments and biological substances status: Secondary | ICD-10-CM | POA: Insufficient documentation

## 2017-01-08 DIAGNOSIS — I25119 Atherosclerotic heart disease of native coronary artery with unspecified angina pectoris: Secondary | ICD-10-CM

## 2017-01-08 DIAGNOSIS — N183 Chronic kidney disease, stage 3 (moderate): Secondary | ICD-10-CM | POA: Insufficient documentation

## 2017-01-08 DIAGNOSIS — E1122 Type 2 diabetes mellitus with diabetic chronic kidney disease: Secondary | ICD-10-CM | POA: Insufficient documentation

## 2017-01-08 DIAGNOSIS — E039 Hypothyroidism, unspecified: Secondary | ICD-10-CM | POA: Diagnosis not present

## 2017-01-08 DIAGNOSIS — E119 Type 2 diabetes mellitus without complications: Secondary | ICD-10-CM | POA: Diagnosis not present

## 2017-01-08 DIAGNOSIS — J438 Other emphysema: Secondary | ICD-10-CM

## 2017-01-08 DIAGNOSIS — J439 Emphysema, unspecified: Secondary | ICD-10-CM | POA: Insufficient documentation

## 2017-01-08 DIAGNOSIS — K219 Gastro-esophageal reflux disease without esophagitis: Secondary | ICD-10-CM | POA: Diagnosis not present

## 2017-01-08 DIAGNOSIS — Z7902 Long term (current) use of antithrombotics/antiplatelets: Secondary | ICD-10-CM | POA: Diagnosis not present

## 2017-01-08 DIAGNOSIS — I129 Hypertensive chronic kidney disease with stage 1 through stage 4 chronic kidney disease, or unspecified chronic kidney disease: Secondary | ICD-10-CM | POA: Diagnosis not present

## 2017-01-08 MED ORDER — FAMOTIDINE 20 MG PO TABS: 20.0000 mg | ORAL_TABLET | Freq: Two times a day (BID) | ORAL | 1 refills | Status: DC

## 2017-01-08 MED ORDER — ASPIRIN EC 81 MG PO TBEC: 81.0000 mg | DELAYED_RELEASE_TABLET | Freq: Every day | ORAL | 5 refills | Status: DC

## 2017-01-08 MED ORDER — TIOTROPIUM BROMIDE MONOHYDRATE 18 MCG IN CAPS: 18.0000 ug | ORAL_CAPSULE | Freq: Every day | RESPIRATORY_TRACT | 3 refills | Status: DC

## 2017-01-08 MED ORDER — ATORVASTATIN CALCIUM 40 MG PO TABS: 40.0000 mg | ORAL_TABLET | Freq: Every day | ORAL | 3 refills | Status: DC

## 2017-01-08 MED ORDER — CARVEDILOL 3.125 MG PO TABS: 3.1250 mg | ORAL_TABLET | Freq: Two times a day (BID) | ORAL | 11 refills | Status: DC

## 2017-01-08 NOTE — Patient Instructions (Addendum)
-   make lab appointment 3-4 wks for fasting cholesterol     Continue to take protonix (acid reflux) for couple days after starting pepcid. Than stop protonix.  -  Food Choices for Gastroesophageal Reflux Disease, Adult When you have gastroesophageal reflux disease (GERD), the foods you eat and your eating habits are very important. Choosing the right foods can help ease your discomfort. What guidelines do I need to follow?  Choose fruits, vegetables, whole grains, and low-fat dairy products.  Choose low-fat meat, fish, and poultry.  Limit fats such as oils, salad dressings, butter, nuts, and avocado.  Keep a food diary. This helps you identify foods that cause symptoms.  Avoid foods that cause symptoms. These may be different for everyone.  Eat small meals often instead of 3 large meals a day.  Eat your meals slowly, in a place where you are relaxed.  Limit fried foods.  Cook foods using methods other than frying.  Avoid drinking alcohol.  Avoid drinking large amounts of liquids with your meals.  Avoid bending over or lying down until 2-3 hours after eating. What foods are not recommended? These are some foods and drinks that may make your symptoms worse: Vegetables  Tomatoes. Tomato juice. Tomato and spaghetti sauce. Chili peppers. Onion and garlic. Horseradish. Fruits  Oranges, grapefruit, and lemon (fruit and juice). Meats  High-fat meats, fish, and poultry. This includes hot dogs, ribs, ham, sausage, salami, and bacon. Dairy  Whole milk and chocolate milk. Sour cream. Cream. Butter. Ice cream. Cream cheese. Drinks  Coffee and tea. Bubbly (carbonated) drinks or energy drinks. Condiments  Hot sauce. Barbecue sauce. Sweets/Desserts  Chocolate and cocoa. Donuts. Peppermint and spearmint. Fats and Oils  High-fat foods. This includes [place] fries and potato chips. Other  Vinegar. Strong spices. This includes black pepper, white pepper, red pepper, cayenne, curry  powder, cloves, ginger, and chili powder. The items listed above may not be a complete list of foods and drinks to avoid. Contact your dietitian for more information.  This information is not intended to replace advice given to you by your health care provider. Make sure you discuss any questions you have with your health care provider. Document Released: 05/13/2012 Document Revised: 04/19/2016 Document Reviewed: 09/16/2013 Elsevier Interactive Patient Education  2017 [employer].
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
## 2017-01-09 ENCOUNTER — Telehealth: Payer: Self-pay

## 2017-01-09 ENCOUNTER — Other Ambulatory Visit: Payer: Self-pay | Admitting: Internal Medicine

## 2017-01-09 LAB — TSH: TSH: 3.87 mIU/L (ref 0.40–4.50)

## 2017-01-09 MED ORDER — LEVOTHYROXINE SODIUM 125 MCG PO TABS: 125.0000 ug | ORAL_TABLET | Freq: Every day | ORAL | 2 refills | Status: DC

## 2017-01-09 MED ORDER — SUCRALFATE 1 GM/10ML PO SUSP: 1.0000 g | Freq: Three times a day (TID) | ORAL | 0 refills | Status: DC

## 2017-01-09 NOTE — Telephone Encounter (Signed)
I gave pt rx// liquid suspension form of sucrafate for his pud instead of pill form to see if helps. When he gets off of his protonix, schedule lab appt to come it to check for hpylori urea breath test (ordered as future order) . thanks

## 2017-01-09 NOTE — Telephone Encounter (Signed)
Contacted pt to go over lab results spoke with patients brother about results. Brother states he understands and doesn't have any questions or concerns

## 2017-01-17 ENCOUNTER — Telehealth (HOSPITAL_COMMUNITY): Payer: Self-pay

## 2017-01-17 NOTE — Telephone Encounter (Signed)
01/17/2017  Attempted to call patient to schedule for new pharmacy service regarding inhaler education and technique. Left HIPAA compliant message with clinic phone number to call to schedule and Dr. Kirby Funk phone number for more information regarding service.  Belia Heman, PharmD PGY1 Pharmacy Resident [phone] (Pager) 01/17/2017 2:43 PM

## 2017-01-29 ENCOUNTER — Ambulatory Visit: Payer: Medicare Other | Attending: Internal Medicine

## 2017-01-29 DIAGNOSIS — I25119 Atherosclerotic heart disease of native coronary artery with unspecified angina pectoris: Secondary | ICD-10-CM | POA: Insufficient documentation

## 2017-01-29 DIAGNOSIS — K279 Peptic ulcer, site unspecified, unspecified as acute or chronic, without hemorrhage or perforation: Secondary | ICD-10-CM

## 2017-01-29 LAB — LIPID PANEL
CHOL/HDL RATIO: 3.5 ratio (ref <5.0)
Cholesterol: 175 mg/dL (ref <200)
HDL: 50 mg/dL (ref >40)
LDL CALC: 91 mg/dL (ref <100)
Triglycerides: 169 mg/dL — ABNORMAL HIGH (ref <150)
VLDL: 34 mg/dL — AB (ref <30)

## 2017-01-29 NOTE — Progress Notes (Signed)
Patient here for lab visit  

## 2017-01-30 ENCOUNTER — Telehealth: Payer: Self-pay

## 2017-01-30 LAB — H. PYLORI BREATH TEST: H. PYLORI BREATH TEST: NOT DETECTED

## 2017-01-30 NOTE — Telephone Encounter (Signed)
Contacted pt to go over lab results pt is aware of results and doesn't have any questions or concerns 

## 2017-02-04 ENCOUNTER — Telehealth: Payer: Self-pay | Admitting: Internal Medicine

## 2017-02-04 NOTE — Telephone Encounter (Signed)
Patient called the office to speak with PC regarding his medication. Because patient's insurance will not cover current medication patient has to take incruse ellipta. Pt want to know if this ok to take. Please follow.   Thank you.

## 2017-02-05 ENCOUNTER — Telehealth: Payer: Self-pay

## 2017-02-05 MED ORDER — UMECLIDINIUM BROMIDE 62.5 MCG/INH IN AEPB: 1.0000 | INHALATION_SPRAY | Freq: Every day | RESPIRATORY_TRACT | 2 refills | Status: DC

## 2017-02-05 NOTE — Telephone Encounter (Signed)
02/05/2017  Contacted George Robbins today to attempt to schedule with [facility] pharmacist for new pharmacy service regarding inhaler education and use. Left HIPAA compliant message for patient to call Dr. Theda Sers to schedule an appointment.   Belia Heman, PharmD PGY1 Pharmacy Resident [phone] (Pager)

## 2017-02-05 NOTE — Telephone Encounter (Signed)
Called pt, confirmed dob. Per pt, his insurance declined spiriva. Will ask Ruthy Dick to look into it - prior auth?  Erline Levine - could you look into this. Is there a prior aurth or close substitute ins would accept? thanks

## 2017-02-05 NOTE — Telephone Encounter (Signed)
Incruse Ellipta is the preferred and is the same as Spiriva, just a different long acting version. Will order preferred and if patient does not tolerate, can do Spiriva prior auth (patient must fail this drug first).

## 2017-02-06 NOTE — Telephone Encounter (Signed)
Pt aware of message. States he picked up on yesterday. Will try this medication. Instructed to call back if not helping.

## 2017-02-15 ENCOUNTER — Encounter (HOSPITAL_COMMUNITY): Payer: Self-pay | Admitting: Emergency Medicine

## 2017-02-15 ENCOUNTER — Emergency Department (HOSPITAL_COMMUNITY)
Admission: EM | Admit: 2017-02-15 | Discharge: 2017-02-17 | Disposition: A | Discharge status: Home / Self Care | Payer: Medicare Other | Attending: Emergency Medicine | Admitting: Emergency Medicine

## 2017-02-15 ENCOUNTER — Emergency Department (HOSPITAL_COMMUNITY): Payer: Medicare Other

## 2017-02-15 DIAGNOSIS — R1084 Generalized abdominal pain: Secondary | ICD-10-CM

## 2017-02-15 DIAGNOSIS — I129 Hypertensive chronic kidney disease with stage 1 through stage 4 chronic kidney disease, or unspecified chronic kidney disease: Secondary | ICD-10-CM | POA: Insufficient documentation

## 2017-02-15 DIAGNOSIS — Z87891 Personal history of nicotine dependence: Secondary | ICD-10-CM | POA: Diagnosis not present

## 2017-02-15 DIAGNOSIS — I2510 Atherosclerotic heart disease of native coronary artery without angina pectoris: Secondary | ICD-10-CM | POA: Insufficient documentation

## 2017-02-15 DIAGNOSIS — R05 Cough: Secondary | ICD-10-CM | POA: Diagnosis not present

## 2017-02-15 DIAGNOSIS — R059 Cough, unspecified: Secondary | ICD-10-CM

## 2017-02-15 DIAGNOSIS — E039 Hypothyroidism, unspecified: Secondary | ICD-10-CM | POA: Diagnosis not present

## 2017-02-15 DIAGNOSIS — R109 Unspecified abdominal pain: Secondary | ICD-10-CM | POA: Diagnosis present

## 2017-02-15 DIAGNOSIS — N183 Chronic kidney disease, stage 3 (moderate): Secondary | ICD-10-CM | POA: Insufficient documentation

## 2017-02-15 DIAGNOSIS — R112 Nausea with vomiting, unspecified: Secondary | ICD-10-CM | POA: Diagnosis not present

## 2017-02-15 DIAGNOSIS — Z7982 Long term (current) use of aspirin: Secondary | ICD-10-CM | POA: Diagnosis not present

## 2017-02-15 LAB — COMPREHENSIVE METABOLIC PANEL
ALK PHOS: 104 U/L (ref 38–126)
ALT: 47 U/L (ref 17–63)
AST: 27 U/L (ref 15–41)
Albumin: 4.4 g/dL (ref 3.5–5.0)
Anion gap: 10 (ref 5–15)
BILIRUBIN TOTAL: 0.7 mg/dL (ref 0.3–1.2)
BUN: 11 mg/dL (ref 6–20)
CALCIUM: 10.4 mg/dL — AB (ref 8.9–10.3)
CO2: 27 mmol/L (ref 22–32)
CREATININE: 1.14 mg/dL (ref 0.61–1.24)
Chloride: 101 mmol/L (ref 101–111)
GFR calc Af Amer: >60 mL/min (ref >60)
Glucose, Bld: 102 mg/dL — ABNORMAL HIGH (ref 65–99)
Potassium: 3.8 mmol/L (ref 3.5–5.1)
Sodium: 138 mmol/L (ref 135–145)
TOTAL PROTEIN: 7.5 g/dL (ref 6.5–8.1)

## 2017-02-15 LAB — URINALYSIS, ROUTINE W REFLEX MICROSCOPIC
BILIRUBIN URINE: NEGATIVE
GLUCOSE, UA: NEGATIVE mg/dL
Hgb urine dipstick: NEGATIVE
KETONES UR: NEGATIVE mg/dL
LEUKOCYTES UA: NEGATIVE
NITRITE: NEGATIVE
PROTEIN: NEGATIVE mg/dL
Specific Gravity, Urine: 1.01 (ref 1.005–1.030)
pH: 6 (ref 5.0–8.0)

## 2017-02-15 LAB — CBC
HCT: 41 % (ref 39.0–52.0)
Hemoglobin: 14.2 g/dL (ref 13.0–17.0)
MCH: 29.7 pg (ref 26.0–34.0)
MCHC: 34.6 g/dL (ref 30.0–36.0)
MCV: 85.8 fL (ref 78.0–100.0)
PLATELETS: 193 10*3/uL (ref 150–400)
RBC: 4.78 MIL/uL (ref 4.22–5.81)
RDW: 15.1 % (ref 11.5–15.5)
WBC: 13.4 10*3/uL — AB (ref 4.0–10.5)

## 2017-02-15 LAB — LIPASE, BLOOD: Lipase: 25 U/L (ref 11–51)

## 2017-02-15 MED ORDER — IPRATROPIUM-ALBUTEROL 0.5-2.5 (3) MG/3ML IN SOLN
3.0000 mL | Freq: Once | RESPIRATORY_TRACT | Status: AC
  Administered 2017-02-15: 3 mL via RESPIRATORY_TRACT
  Filled 2017-02-15: qty 3

## 2017-02-15 NOTE — ED Notes (Signed)
Patient just returned via stretcher from xray.
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
## 2017-02-15 NOTE — ED Notes (Signed)
Neb treatment in process at this time.  

## 2017-02-15 NOTE — ED Notes (Signed)
Will Dansie, PA at bedside at this time.

## 2017-02-15 NOTE — ED Triage Notes (Signed)
Pt states since Monday he has been having productive cough, abd pain ever since he stepped on a stink bug. EMS has bug in medicine jar to show RN. Pt from Home. EMS reports no resp distress, lungs clear. Pt reports vomiting 3 times on Monday. BP 160/100, HR 100

## 2017-02-16 MED ORDER — BENZONATATE 100 MG PO CAPS: 100.0000 mg | ORAL_CAPSULE | Freq: Three times a day (TID) | ORAL | 0 refills | Status: DC | PRN

## 2017-03-03 ENCOUNTER — Other Ambulatory Visit: Payer: Self-pay | Admitting: Internal Medicine

## 2017-03-12 ENCOUNTER — Other Ambulatory Visit: Payer: Self-pay | Admitting: Pharmacist

## 2017-03-12 MED ORDER — VENTOLIN HFA 108 (90 BASE) MCG/ACT IN AERS
2.0000 | INHALATION_SPRAY | Freq: Four times a day (QID) | RESPIRATORY_TRACT | 1 refills | Status: DC | PRN
Start: 2017-03-12 — End: 2017-04-09

## 2017-03-19 ENCOUNTER — Emergency Department (HOSPITAL_COMMUNITY): Payer: Medicare Other

## 2017-03-19 ENCOUNTER — Encounter (HOSPITAL_COMMUNITY): Payer: Self-pay | Admitting: Emergency Medicine

## 2017-03-19 DIAGNOSIS — R079 Chest pain, unspecified: Secondary | ICD-10-CM | POA: Diagnosis not present

## 2017-03-19 DIAGNOSIS — Z87891 Personal history of nicotine dependence: Secondary | ICD-10-CM | POA: Diagnosis not present

## 2017-03-19 DIAGNOSIS — I2510 Atherosclerotic heart disease of native coronary artery without angina pectoris: Secondary | ICD-10-CM | POA: Diagnosis not present

## 2017-03-19 DIAGNOSIS — Z7982 Long term (current) use of aspirin: Secondary | ICD-10-CM | POA: Insufficient documentation

## 2017-03-19 DIAGNOSIS — J449 Chronic obstructive pulmonary disease, unspecified: Secondary | ICD-10-CM | POA: Diagnosis not present

## 2017-03-19 DIAGNOSIS — R0789 Other chest pain: Secondary | ICD-10-CM | POA: Insufficient documentation

## 2017-03-19 DIAGNOSIS — I129 Hypertensive chronic kidney disease with stage 1 through stage 4 chronic kidney disease, or unspecified chronic kidney disease: Secondary | ICD-10-CM | POA: Diagnosis not present

## 2017-03-19 DIAGNOSIS — Z79899 Other long term (current) drug therapy: Secondary | ICD-10-CM | POA: Insufficient documentation

## 2017-03-19 DIAGNOSIS — E039 Hypothyroidism, unspecified: Secondary | ICD-10-CM | POA: Insufficient documentation

## 2017-03-19 DIAGNOSIS — N183 Chronic kidney disease, stage 3 (moderate): Secondary | ICD-10-CM | POA: Insufficient documentation

## 2017-03-19 LAB — CBC
HCT: 40 % (ref 39.0–52.0)
Hemoglobin: 13.7 g/dL (ref 13.0–17.0)
MCH: 29.8 pg (ref 26.0–34.0)
MCHC: 34.3 g/dL (ref 30.0–36.0)
MCV: 87 fL (ref 78.0–100.0)
PLATELETS: 182 10*3/uL (ref 150–400)
RBC: 4.6 MIL/uL (ref 4.22–5.81)
RDW: 14.7 % (ref 11.5–15.5)
WBC: 9.9 10*3/uL (ref 4.0–10.5)

## 2017-03-19 LAB — I-STAT TROPONIN, ED: Troponin i, poc: 0 ng/mL (ref 0.00–0.08)

## 2017-03-19 LAB — BASIC METABOLIC PANEL
Anion gap: 9 (ref 5–15)
BUN: 8 mg/dL (ref 6–20)
CALCIUM: 9.7 mg/dL (ref 8.9–10.3)
CHLORIDE: 100 mmol/L — AB (ref 101–111)
CO2: 28 mmol/L (ref 22–32)
CREATININE: 1.13 mg/dL (ref 0.61–1.24)
GFR calc Af Amer: >60 mL/min (ref >60)
GFR calc non Af Amer: >60 mL/min (ref >60)
Glucose, Bld: 100 mg/dL — ABNORMAL HIGH (ref 65–99)
Potassium: 3.9 mmol/L (ref 3.5–5.1)
SODIUM: 137 mmol/L (ref 135–145)

## 2017-03-19 NOTE — ED Triage Notes (Signed)
Pt c/o 9/10 bilateral cp states pain comes and goes, denies SOB, dizziness, nausea or vomiting.

## 2017-03-20 ENCOUNTER — Emergency Department (HOSPITAL_COMMUNITY)
Admission: EM | Admit: 2017-03-20 | Discharge: 2017-03-20 | Disposition: A | Discharge status: Home / Self Care | Payer: Medicare Other | Attending: Emergency Medicine | Admitting: Emergency Medicine

## 2017-03-20 DIAGNOSIS — R079 Chest pain, unspecified: Secondary | ICD-10-CM

## 2017-03-20 LAB — I-STAT TROPONIN, ED: TROPONIN I, POC: 0 ng/mL (ref 0.00–0.08)
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
## 2017-03-20 NOTE — Discharge Instructions (Signed)
Follow-up with your cardiologist in the next week for a recheck, and return to the emergency department in the meantime if your symptoms significantly worsen or change.

## 2017-04-09 ENCOUNTER — Encounter: Payer: Self-pay | Admitting: Internal Medicine

## 2017-04-09 ENCOUNTER — Ambulatory Visit: Payer: Medicare Other | Attending: Internal Medicine | Admitting: Internal Medicine

## 2017-04-09 VITALS — BP 115/76 | HR 78 | Temp 98.4°F | Resp 16 | Wt 183.0 lb

## 2017-04-09 DIAGNOSIS — E039 Hypothyroidism, unspecified: Secondary | ICD-10-CM | POA: Diagnosis not present

## 2017-04-09 DIAGNOSIS — J438 Other emphysema: Secondary | ICD-10-CM

## 2017-04-09 DIAGNOSIS — Z7982 Long term (current) use of aspirin: Secondary | ICD-10-CM | POA: Insufficient documentation

## 2017-04-09 DIAGNOSIS — I25119 Atherosclerotic heart disease of native coronary artery with unspecified angina pectoris: Secondary | ICD-10-CM | POA: Diagnosis not present

## 2017-04-09 DIAGNOSIS — I2510 Atherosclerotic heart disease of native coronary artery without angina pectoris: Secondary | ICD-10-CM | POA: Diagnosis not present

## 2017-04-09 DIAGNOSIS — I10 Essential (primary) hypertension: Secondary | ICD-10-CM

## 2017-04-09 DIAGNOSIS — N183 Chronic kidney disease, stage 3 (moderate): Secondary | ICD-10-CM | POA: Insufficient documentation

## 2017-04-09 DIAGNOSIS — I129 Hypertensive chronic kidney disease with stage 1 through stage 4 chronic kidney disease, or unspecified chronic kidney disease: Secondary | ICD-10-CM | POA: Diagnosis not present

## 2017-04-09 DIAGNOSIS — R0789 Other chest pain: Secondary | ICD-10-CM

## 2017-04-09 DIAGNOSIS — Z125 Encounter for screening for malignant neoplasm of prostate: Secondary | ICD-10-CM

## 2017-04-09 DIAGNOSIS — Z86010 Personal history of colonic polyps: Secondary | ICD-10-CM | POA: Insufficient documentation

## 2017-04-09 DIAGNOSIS — B351 Tinea unguium: Secondary | ICD-10-CM | POA: Diagnosis not present

## 2017-04-09 DIAGNOSIS — R3911 Hesitancy of micturition: Secondary | ICD-10-CM | POA: Diagnosis not present

## 2017-04-09 DIAGNOSIS — E785 Hyperlipidemia, unspecified: Secondary | ICD-10-CM | POA: Diagnosis not present

## 2017-04-09 DIAGNOSIS — J302 Other seasonal allergic rhinitis: Secondary | ICD-10-CM | POA: Diagnosis not present

## 2017-04-09 DIAGNOSIS — J439 Emphysema, unspecified: Secondary | ICD-10-CM | POA: Insufficient documentation

## 2017-04-09 DIAGNOSIS — Z7902 Long term (current) use of antithrombotics/antiplatelets: Secondary | ICD-10-CM | POA: Insufficient documentation

## 2017-04-09 DIAGNOSIS — K219 Gastro-esophageal reflux disease without esophagitis: Secondary | ICD-10-CM | POA: Diagnosis not present

## 2017-04-09 DIAGNOSIS — I209 Angina pectoris, unspecified: Secondary | ICD-10-CM | POA: Diagnosis not present

## 2017-04-09 MED ORDER — PANTOPRAZOLE SODIUM 40 MG PO TBEC: 40.0000 mg | DELAYED_RELEASE_TABLET | Freq: Every day | ORAL | 3 refills | Status: DC

## 2017-04-09 MED ORDER — FLUTICASONE PROPIONATE 50 MCG/ACT NA SUSP: NASAL | 5 refills | Status: DC

## 2017-04-09 MED ORDER — GUAIFENESIN ER 600 MG PO TB12: 600.0000 mg | ORAL_TABLET | Freq: Two times a day (BID) | ORAL | 3 refills | Status: DC

## 2017-04-09 MED ORDER — CARVEDILOL 3.125 MG PO TABS: 3.1250 mg | ORAL_TABLET | Freq: Two times a day (BID) | ORAL | 11 refills | Status: DC

## 2017-04-09 MED ORDER — ATORVASTATIN CALCIUM 40 MG PO TABS: 40.0000 mg | ORAL_TABLET | Freq: Every day | ORAL | 3 refills | Status: DC

## 2017-04-09 MED ORDER — CETIRIZINE HCL 10 MG PO TABS: 10.0000 mg | ORAL_TABLET | Freq: Every day | ORAL | 11 refills | Status: DC

## 2017-04-09 MED ORDER — VENTOLIN HFA 108 (90 BASE) MCG/ACT IN AERS
2.0000 | INHALATION_SPRAY | Freq: Four times a day (QID) | RESPIRATORY_TRACT | 11 refills | Status: DC | PRN
Start: 2017-04-09 — End: 2019-01-30

## 2017-04-09 MED ORDER — ASPIRIN EC 81 MG PO TBEC: 81.0000 mg | DELAYED_RELEASE_TABLET | Freq: Every day | ORAL | 5 refills | Status: DC

## 2017-04-09 MED ORDER — CLOPIDOGREL BISULFATE 75 MG PO TABS: 75.0000 mg | ORAL_TABLET | Freq: Every day | ORAL | 11 refills | Status: DC
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
## 2017-04-09 NOTE — Patient Instructions (Addendum)
Call your heart doctor and lung doctor to make fu appoitments.  Heart doctor:  Saw NP in Aug 2017, [name], NP  07/25/2016 9:49 AM    [place], [address] [place], [place] Phone: [phone]; Fax: [phone]   Lung doctor: DR Darlin Drop pulmonary [phone]   Food Choices for Gastroesophageal Reflux Disease, Adult When you have gastroesophageal reflux disease (GERD), the foods you eat and your eating habits are very important. Choosing the right foods can help ease your discomfort. What guidelines do I need to follow?  Choose fruits, vegetables, whole grains, and low-fat dairy products.  Choose low-fat meat, fish, and poultry.  Limit fats such as oils, salad dressings, butter, nuts, and avocado.  Keep a food diary. This helps you identify foods that cause symptoms.  Avoid foods that cause symptoms. These may be different for everyone.  Eat small meals often instead of 3 large meals a day.  Eat your meals slowly, in a place where you are relaxed.  Limit fried foods.  Cook foods using methods other than frying.  Avoid drinking alcohol.  Avoid drinking large amounts of liquids with your meals.  Avoid bending over or lying down until 2-3 hours after eating. What foods are not recommended? These are some foods and drinks that may make your symptoms worse: Vegetables  Tomatoes. Tomato juice. Tomato and spaghetti sauce. Chili peppers. Onion and garlic. Horseradish. Fruits  Oranges, grapefruit, and lemon (fruit and juice). Meats  High-fat meats, fish, and poultry. This includes hot dogs, ribs, ham, sausage, salami, and bacon. Dairy  Whole milk and chocolate milk. Sour cream. Cream. Butter. Ice cream. Cream cheese. Drinks  Coffee and tea. Bubbly (carbonated) drinks or energy drinks. Condiments  Hot sauce. Barbecue sauce. Sweets/Desserts  Chocolate and cocoa. Donuts.  Peppermint and spearmint. Fats and Oils  High-fat foods. This includes [place] fries and potato chips. Other  Vinegar. Strong spices. This includes black pepper, white pepper, red pepper, cayenne, curry powder, cloves, ginger, and chili powder. The items listed above may not be a complete list of foods and drinks to avoid. Contact your dietitian for more information.  This information is not intended to replace advice given to you by your health care provider. Make sure you discuss any questions you have with your health care provider. Document Released: 05/13/2012 Document Revised: 04/19/2016 Document Reviewed: 09/16/2013 Elsevier Interactive Patient Education  2017 Elsevier Inc.   -   Chest Wall Pain Chest wall pain is pain in or around the bones and muscles of your chest. Sometimes, an injury causes this pain. Sometimes, the cause may not be known. This pain may take several weeks or longer to get better. Follow these instructions at home: Pay attention to any changes in your symptoms. Take these actions to help with your pain:  Rest as told by your doctor.  Avoid activities that cause pain. Try not to use your chest, belly (abdominal), or side muscles to lift heavy things.  If directed, apply ice to the painful area:  Put ice in a plastic bag.  Place a towel between your skin and the bag.  Leave the ice on for 20 minutes, 2-3 times per day.  Take over-the-counter and prescription medicines only as told by your doctor.  Do not use tobacco products, including cigarettes, chewing tobacco, and e-cigarettes. If you need help quitting, ask your doctor.  Keep all follow-up visits as  told by your doctor. This is important. Contact a doctor if:  You have a fever.  Your chest pain gets worse.  You have new symptoms. Get help right away if:  You feel sick to your stomach (nauseous) or you throw up (vomit).  You feel sweaty or light-headed.  You have a cough with phlegm  (sputum) or you cough up blood.  You are short of breath. This information is not intended to replace advice given to you by your health care provider. Make sure you discuss any questions you have with your health care provider. Document Released: 04/30/2008 Document Revised: 04/19/2016 Document Reviewed: 02/07/2015 Elsevier Interactive Patient Education  2017 Elsevier Inc.   -   Back Pain, Adult Back pain is very common. The pain often gets better over time. The cause of back pain is usually not dangerous. Most people can learn to manage their back pain on their own. Follow these instructions at home: Watch your back pain for any changes. The following actions may help to lessen any pain you are feeling:  Stay active. Start with short walks on flat ground if you can. Try to walk farther each day.  Exercise regularly as told by your doctor. Exercise helps your back heal faster. It also helps avoid future injury by keeping your muscles strong and flexible.  Do not sit, drive, or stand in one place for more than 30 minutes.  Do not stay in bed. Resting more than 1-2 days can slow down your recovery.  Be careful when you bend or lift an object. Use good form when lifting:  Bend at your knees.  Keep the object close to your body.  Do not twist.  Sleep on a firm mattress. Lie on your side, and bend your knees. If you lie on your back, put a pillow under your knees.  Take medicines only as told by your doctor.  Put ice on the injured area.  Put ice in a plastic bag.  Place a towel between your skin and the bag.  Leave the ice on for 20 minutes, 2-3 times a day for the first 2-3 days. After that, you can switch between ice and heat packs.  Avoid feeling anxious or stressed. Find good ways to deal with stress, such as exercise.  Maintain a healthy weight. Extra weight puts stress on your back. Contact a doctor if:  You have pain that does not go away with rest or  medicine.  You have worsening pain that goes down into your legs or buttocks.  You have pain that does not get better in one week.  You have pain at night.  You lose weight.  You have a fever or chills. Get help right away if:  You cannot control when you poop (bowel movement) or pee (urinate).  Your arms or legs feel weak.  Your arms or legs lose feeling (numbness).  You feel sick to your stomach (nauseous) or throw up (vomit).  You have belly (abdominal) pain.  You feel like you may pass out (faint). This information is not intended to replace advice given to you by your health care provider. Make sure you discuss any questions you have with your health care provider. Document Released: 04/30/2008 Document Revised: 04/19/2016 Document Reviewed: 03/16/2014 Elsevier Interactive Patient Education  2017 [employer].   -  Back Exercises If you have pain in your back, do these exercises 2-3 times each day or as told by your doctor. When the pain goes  away, do the exercises once each day, but repeat the steps more times for each exercise (do more repetitions). If you do not have pain in your back, do these exercises once each day or as told by your doctor. Exercises Single Knee to Chest   Do these steps 3-5 times in a row for each leg: 1. Lie on your back on a firm bed or the floor with your legs stretched out. 2. Bring one knee to your chest. 3. Hold your knee to your chest by grabbing your knee or thigh. 4. Pull on your knee until you feel a gentle stretch in your lower back. 5. Keep doing the stretch for 10-30 seconds. 6. Slowly let go of your leg and straighten it. Pelvic Tilt   Do these steps 5-10 times in a row: 1. Lie on your back on a firm bed or the floor with your legs stretched out. 2. Bend your knees so they point up to the ceiling. Your feet should be flat on the floor. 3. Tighten your lower belly (abdomen) muscles to press your lower back against the floor.  This will make your tailbone point up to the ceiling instead of pointing down to your feet or the floor. 4. Stay in this position for 5-10 seconds while you gently tighten your muscles and breathe evenly. Cat-Cow   Do these steps until your lower back bends more easily: 1. Get on your hands and knees on a firm surface. Keep your hands under your shoulders, and keep your knees under your hips. You may put padding under your knees. 2. Let your head hang down, and make your tailbone point down to the floor so your lower back is round like the back of a cat. 3. Stay in this position for 5 seconds. 4. Slowly lift your head and make your tailbone point up to the ceiling so your back hangs low (sags) like the back of a cow. 5. Stay in this position for 5 seconds. Press-Ups   Do these steps 5-10 times in a row: 1. Lie on your belly (face-down) on the floor. 2. Place your hands near your head, about shoulder-width apart. 3. While you keep your back relaxed and keep your hips on the floor, slowly straighten your arms to raise the top half of your body and lift your shoulders. Do not use your back muscles. To make yourself more comfortable, you may change where you place your hands. 4. Stay in this position for 5 seconds. 5. Slowly return to lying flat on the floor. Bridges   Do these steps 10 times in a row: 1. Lie on your back on a firm surface. 2. Bend your knees so they point up to the ceiling. Your feet should be flat on the floor. 3. Tighten your butt muscles and lift your butt off of the floor until your waist is almost as high as your knees. If you do not feel the muscles working in your butt and the back of your thighs, slide your feet 1-2 inches farther away from your butt. 4. Stay in this position for 3-5 seconds. 5. Slowly lower your butt to the floor, and let your butt muscles relax. If this exercise is too easy, try doing it with your arms crossed over your chest. Belly Crunches   Do  these steps 5-10 times in a row: 1. Lie on your back on a firm bed or the floor with your legs stretched out. 2. Bend your knees so  they point up to the ceiling. Your feet should be flat on the floor. 3. Cross your arms over your chest. 4. Tip your chin a little bit toward your chest but do not bend your neck. 5. Tighten your belly muscles and slowly raise your chest just enough to lift your shoulder blades a tiny bit off of the floor. 6. Slowly lower your chest and your head to the floor. Back Lifts  Do these steps 5-10 times in a row: 1. Lie on your belly (face-down) with your arms at your sides, and rest your forehead on the floor. 2. Tighten the muscles in your legs and your butt. 3. Slowly lift your chest off of the floor while you keep your hips on the floor. Keep the back of your head in line with the curve in your back. Look at the floor while you do this. 4. Stay in this position for 3-5 seconds. 5. Slowly lower your chest and your face to the floor. Contact a doctor if:  Your back pain gets a lot worse when you do an exercise.  Your back pain does not lessen 2 hours after you exercise. If you have any of these problems, stop doing the exercises. Do not do them again unless your doctor says it is okay. Get help right away if:  You have sudden, very bad back pain. If this happens, stop doing the exercises. Do not do them again unless your doctor says it is okay. This information is not intended to replace advice given to you by your health care provider. Make sure you discuss any questions you have with your health care provider. Document Released: 12/15/2010 Document Revised: 04/19/2016 Document Reviewed: 01/06/2015 Elsevier Interactive Patient Education  2017 [employer].

## 2017-04-10 LAB — TSH: TSH: 1.75 u[IU]/mL (ref 0.450–4.500)

## 2017-04-10 LAB — PSA: PROSTATE SPECIFIC AG, SERUM: 0.8 ng/mL (ref 0.0–4.0)

## 2017-04-11 ENCOUNTER — Encounter: Payer: Self-pay | Admitting: Internal Medicine

## 2017-04-12 ENCOUNTER — Encounter: Payer: Self-pay | Admitting: Internal Medicine

## 2017-04-15 ENCOUNTER — Encounter: Payer: Self-pay | Admitting: Internal Medicine

## 2017-04-15 ENCOUNTER — Other Ambulatory Visit: Payer: Self-pay | Admitting: Internal Medicine

## 2017-04-15 MED ORDER — LEVOTHYROXINE SODIUM 125 MCG PO TABS: 125.0000 ug | ORAL_TABLET | Freq: Every day | ORAL | 2 refills | Status: DC

## 2017-04-17 ENCOUNTER — Telehealth: Payer: Self-pay

## 2017-04-17 NOTE — Telephone Encounter (Signed)
Contacted pt to go over lab results pt is aware of results and doesn't have any questions or concerns 

## 2017-05-09 ENCOUNTER — Other Ambulatory Visit: Payer: Self-pay | Admitting: Pharmacist

## 2017-05-09 MED ORDER — UMECLIDINIUM BROMIDE 62.5 MCG/INH IN AEPB: 1.0000 | INHALATION_SPRAY | Freq: Every day | RESPIRATORY_TRACT | 0 refills | Status: DC

## 2017-05-10 ENCOUNTER — Ambulatory Visit: Payer: Medicare Other | Attending: Internal Medicine | Admitting: Internal Medicine

## 2017-05-10 ENCOUNTER — Encounter: Payer: Self-pay | Admitting: Internal Medicine

## 2017-05-10 VITALS — BP 114/79 | HR 74 | Temp 98.0°F | Resp 17 | Ht 64.0 in | Wt 188.0 lb

## 2017-05-10 DIAGNOSIS — Z833 Family history of diabetes mellitus: Secondary | ICD-10-CM | POA: Diagnosis not present

## 2017-05-10 DIAGNOSIS — Z9889 Other specified postprocedural states: Secondary | ICD-10-CM | POA: Diagnosis not present

## 2017-05-10 DIAGNOSIS — Z800 Family history of malignant neoplasm of digestive organs: Secondary | ICD-10-CM | POA: Insufficient documentation

## 2017-05-10 DIAGNOSIS — Z79899 Other long term (current) drug therapy: Secondary | ICD-10-CM | POA: Insufficient documentation

## 2017-05-10 DIAGNOSIS — E669 Obesity, unspecified: Secondary | ICD-10-CM | POA: Insufficient documentation

## 2017-05-10 DIAGNOSIS — E039 Hypothyroidism, unspecified: Secondary | ICD-10-CM | POA: Insufficient documentation

## 2017-05-10 DIAGNOSIS — I25119 Atherosclerotic heart disease of native coronary artery with unspecified angina pectoris: Secondary | ICD-10-CM | POA: Diagnosis not present

## 2017-05-10 DIAGNOSIS — Z8249 Family history of ischemic heart disease and other diseases of the circulatory system: Secondary | ICD-10-CM | POA: Diagnosis not present

## 2017-05-10 DIAGNOSIS — K219 Gastro-esophageal reflux disease without esophagitis: Secondary | ICD-10-CM | POA: Insufficient documentation

## 2017-05-10 DIAGNOSIS — I129 Hypertensive chronic kidney disease with stage 1 through stage 4 chronic kidney disease, or unspecified chronic kidney disease: Secondary | ICD-10-CM | POA: Insufficient documentation

## 2017-05-10 DIAGNOSIS — Z888 Allergy status to other drugs, medicaments and biological substances status: Secondary | ICD-10-CM | POA: Diagnosis not present

## 2017-05-10 DIAGNOSIS — I10 Essential (primary) hypertension: Secondary | ICD-10-CM

## 2017-05-10 DIAGNOSIS — Z955 Presence of coronary angioplasty implant and graft: Secondary | ICD-10-CM | POA: Diagnosis not present

## 2017-05-10 DIAGNOSIS — R001 Bradycardia, unspecified: Secondary | ICD-10-CM | POA: Diagnosis not present

## 2017-05-10 DIAGNOSIS — E785 Hyperlipidemia, unspecified: Secondary | ICD-10-CM | POA: Diagnosis not present

## 2017-05-10 DIAGNOSIS — R0982 Postnasal drip: Secondary | ICD-10-CM | POA: Diagnosis not present

## 2017-05-10 DIAGNOSIS — I2510 Atherosclerotic heart disease of native coronary artery without angina pectoris: Secondary | ICD-10-CM

## 2017-05-10 DIAGNOSIS — Z87891 Personal history of nicotine dependence: Secondary | ICD-10-CM | POA: Insufficient documentation

## 2017-05-10 DIAGNOSIS — Z7982 Long term (current) use of aspirin: Secondary | ICD-10-CM | POA: Diagnosis not present

## 2017-05-10 DIAGNOSIS — Z7902 Long term (current) use of antithrombotics/antiplatelets: Secondary | ICD-10-CM | POA: Diagnosis not present

## 2017-05-10 DIAGNOSIS — N182 Chronic kidney disease, stage 2 (mild): Secondary | ICD-10-CM | POA: Insufficient documentation

## 2017-05-10 DIAGNOSIS — J449 Chronic obstructive pulmonary disease, unspecified: Secondary | ICD-10-CM | POA: Diagnosis not present

## 2017-05-10 MED ORDER — NITROGLYCERIN 0.4 MG SL SUBL: 0.4000 mg | SUBLINGUAL_TABLET | SUBLINGUAL | 1 refills | Status: DC | PRN

## 2017-05-10 MED ORDER — CLOPIDOGREL BISULFATE 75 MG PO TABS: 75.0000 mg | ORAL_TABLET | Freq: Every day | ORAL | 11 refills | Status: DC
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
## 2017-05-10 NOTE — Patient Instructions (Signed)
Stop the Famotidine (Pepcid).  Continue Protonix.

## 2017-05-25 ENCOUNTER — Other Ambulatory Visit: Payer: Self-pay | Admitting: Emergency Medicine

## 2017-06-10 ENCOUNTER — Other Ambulatory Visit: Payer: Self-pay | Admitting: Internal Medicine

## 2017-06-23 ENCOUNTER — Other Ambulatory Visit: Payer: Self-pay | Admitting: Cardiology

## 2017-06-25 ENCOUNTER — Other Ambulatory Visit: Payer: Self-pay | Admitting: Pharmacist

## 2017-06-25 DIAGNOSIS — I2510 Atherosclerotic heart disease of native coronary artery without angina pectoris: Secondary | ICD-10-CM

## 2017-06-25 MED ORDER — ASPIRIN EC 81 MG PO TBEC: 81.0000 mg | DELAYED_RELEASE_TABLET | Freq: Every day | ORAL | 2 refills | Status: AC

## 2017-07-01 ENCOUNTER — Other Ambulatory Visit: Payer: Self-pay | Admitting: Internal Medicine

## 2017-07-22 ENCOUNTER — Other Ambulatory Visit: Payer: Self-pay | Admitting: Cardiology

## 2017-07-26 IMAGING — DX DG CHEST 2V
2 series · 2 of 2 positions shown · non-contrast
Comparison: PA and lateral chest 01/12/2016 and 08/05/2015.

CLINICAL DATA: Substernal chest pain today. No known injury.
Initial encounter.

EXAM:
CHEST  2 VIEW

[chest pa]
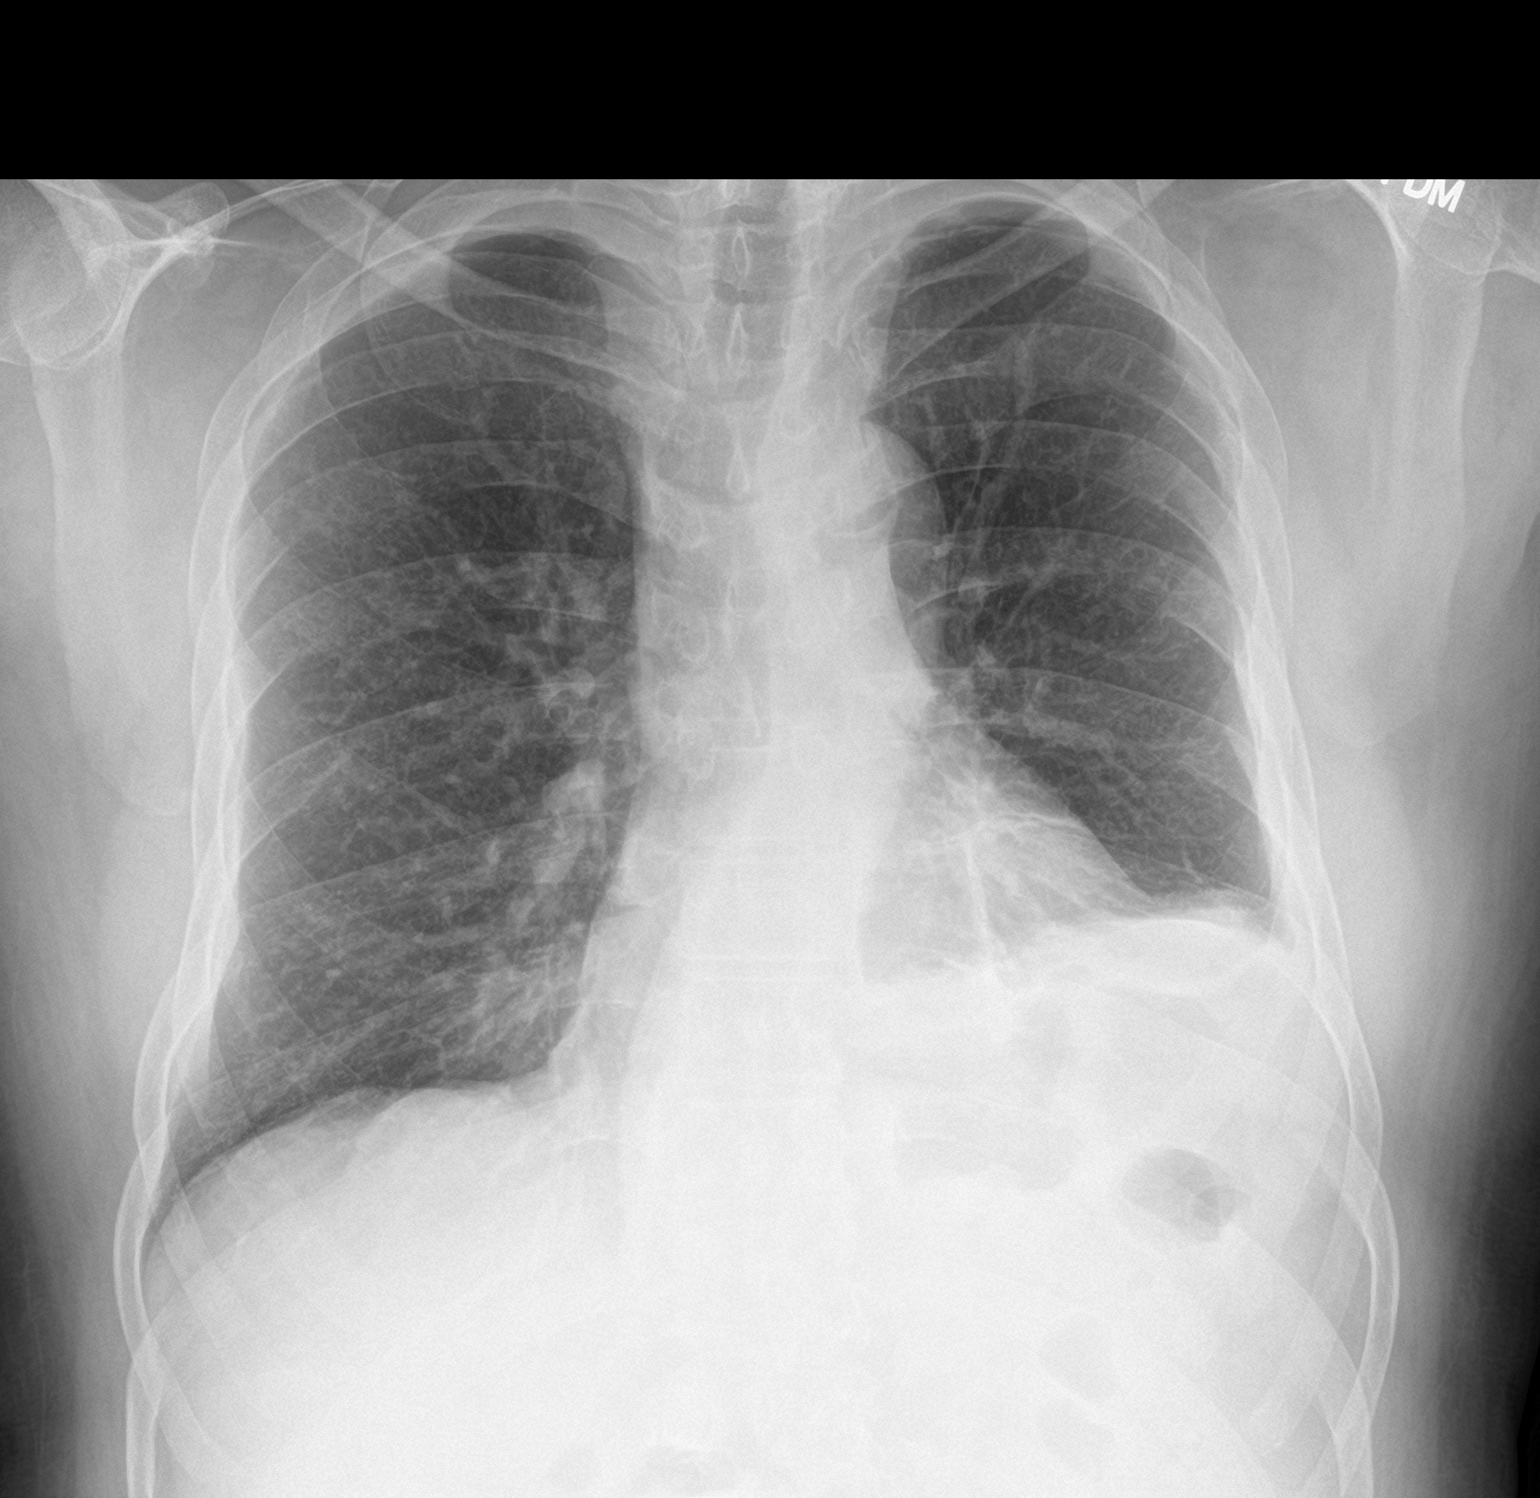

[chest lat]
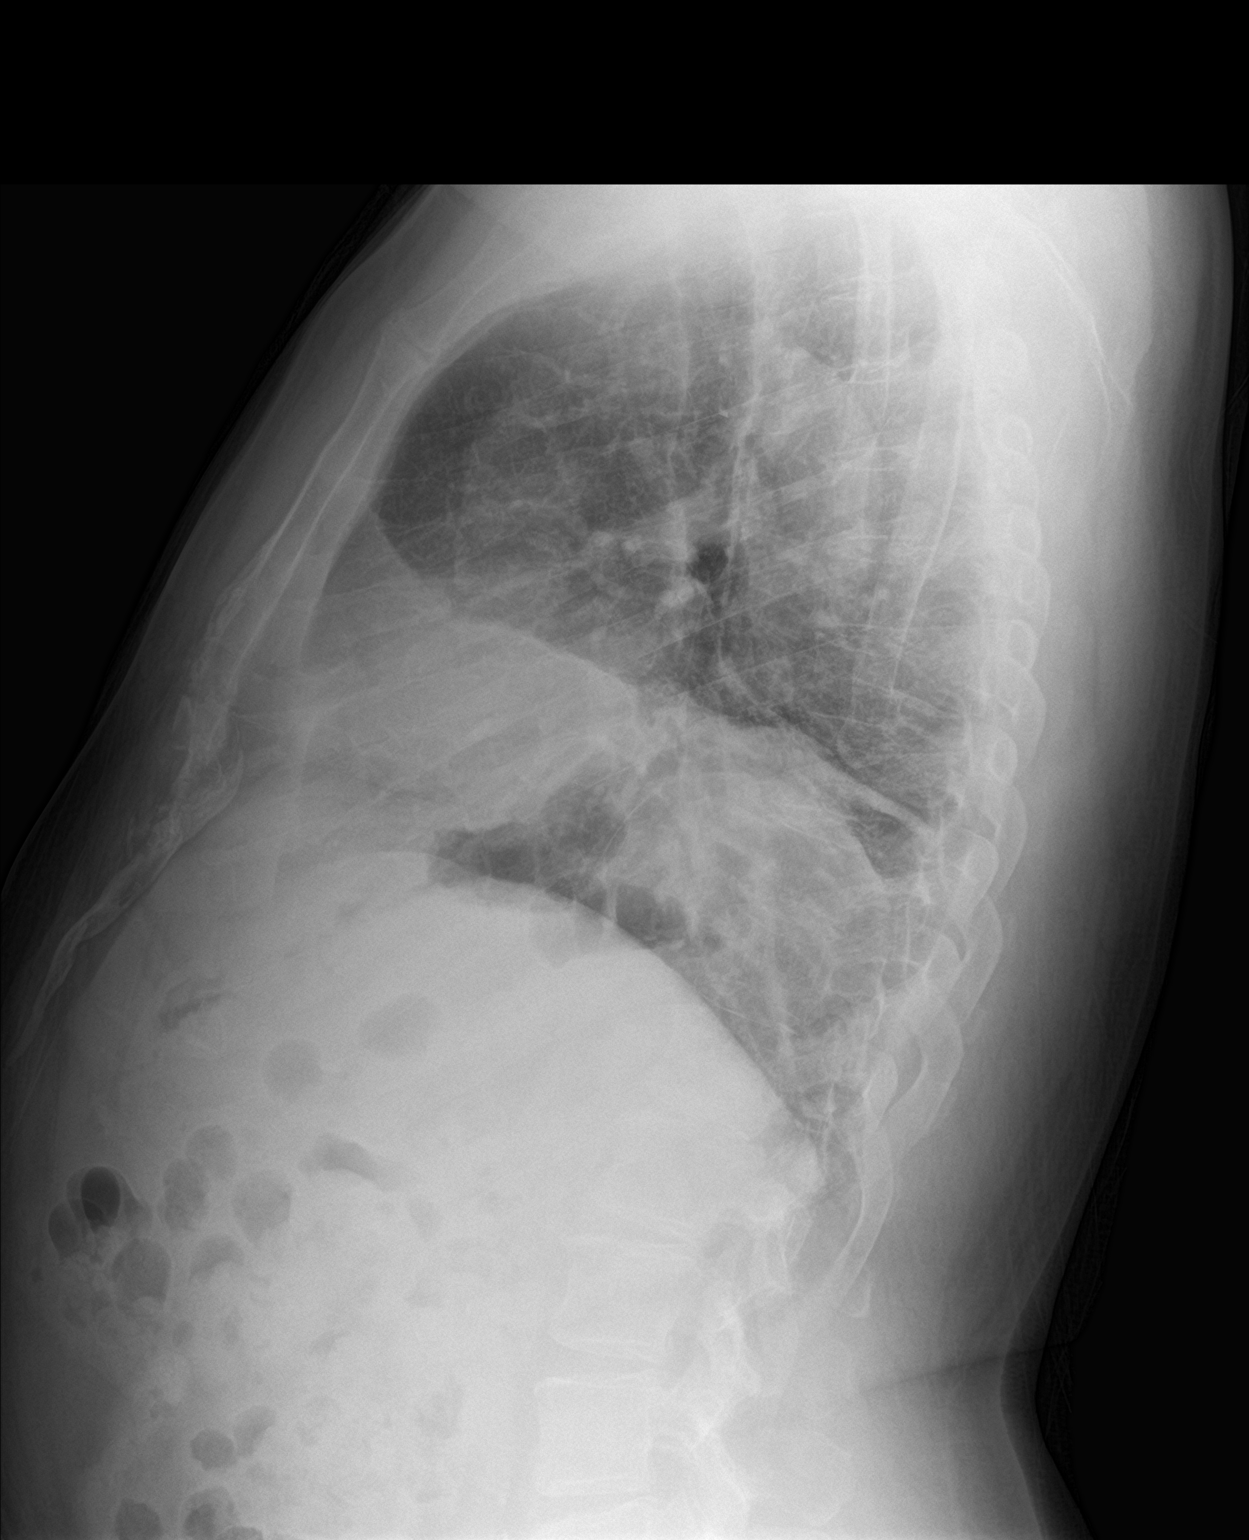

[2 of 2 positions shown; findings below may reference images not displayed]

FINDINGS: Asymmetric elevation of the left hemidiaphragm with left basilar
scarring or atelectasis again seen. The lungs are otherwise clear.
Heart size is normal. No pneumothorax or pleural effusion. Multiple
remote bilateral rib fractures are identified.
IMPRESSION: No acute disease.

## 2017-07-29 ENCOUNTER — Other Ambulatory Visit: Payer: Self-pay | Admitting: Cardiology

## 2017-07-30 ENCOUNTER — Other Ambulatory Visit: Payer: Self-pay | Admitting: Pharmacist

## 2017-07-30 MED ORDER — PANTOPRAZOLE SODIUM 40 MG PO TBEC: 40.0000 mg | DELAYED_RELEASE_TABLET | Freq: Every day | ORAL | 0 refills | Status: DC

## 2017-07-31 ENCOUNTER — Telehealth: Payer: Self-pay | Admitting: Emergency Medicine

## 2017-07-31 NOTE — Telephone Encounter (Signed)
lmtcb for pt. Pt has not been seen since 09/2016 with no rov scheduled.

## 2017-07-31 NOTE — Telephone Encounter (Signed)
Pt returned Ashley's call.

## 2017-07-31 NOTE — Telephone Encounter (Signed)
Pt c/o worsening cough, sob, white mucus production X"quite a while".  Pt not seen since 09/2016.  Scheduled to see RA tomorrow morning as an acute pt.  Nothing further needed at this time.

## 2017-08-01 ENCOUNTER — Ambulatory Visit (INDEPENDENT_AMBULATORY_CARE_PROVIDER_SITE_OTHER): Payer: Medicare Other | Admitting: Pulmonary Disease

## 2017-08-01 ENCOUNTER — Encounter: Payer: Self-pay | Admitting: Pulmonary Disease

## 2017-08-01 DIAGNOSIS — I25119 Atherosclerotic heart disease of native coronary artery with unspecified angina pectoris: Secondary | ICD-10-CM | POA: Diagnosis not present

## 2017-08-01 DIAGNOSIS — Z6835 Body mass index (BMI) 35.0-35.9, adult: Secondary | ICD-10-CM

## 2017-08-01 DIAGNOSIS — J439 Emphysema, unspecified: Secondary | ICD-10-CM | POA: Diagnosis not present

## 2017-08-01 DIAGNOSIS — R05 Cough: Secondary | ICD-10-CM | POA: Diagnosis not present

## 2017-08-01 DIAGNOSIS — R053 Chronic cough: Secondary | ICD-10-CM

## 2017-08-01 MED ORDER — UMECLIDINIUM-VILANTEROL 62.5-25 MCG/INH IN AEPB: 1.0000 | INHALATION_SPRAY | Freq: Every day | RESPIRATORY_TRACT | 0 refills | Status: DC

## 2017-08-01 NOTE — Progress Notes (Signed)
   Subjective:    Patient ID: George Robbins, male    DOB: [birth year], 66 y.o.   MRN: [id number]  HPI Chief Complaint  Patient presents with  . Acute Visit    Pt is being seen today for a cough and SOB almost all summer that has become worse within the past week. Pt is still coughing up occ. white mucus and states that he is pretty much SOB all the time with occ. wheezing. Pt also states that do to his SOB, he has been more fatigued. Pt states that he uses his Ventolin inhaler once a day.   RB pt  George Robbins is a 66 year old ex-smoker with CAD status post stent and CK D. He is a history of prior traumatic pneumothorax and chronic cough PFTs have shown mostly restriction in the past. He smoked about 30-pack-years before he quit in 2005. He has a speech impediment since childhood  He is accompanied by his big sister today. He reports increasing dyspnea throughout the summer he is maintained on incruse for COPD, but she suspects that he is not able to use this due to poor inhaler technique. He denies wheezing. He does have a chronic dry cough  His sister reports that he is very sedentary and does not exert himself at all He denies orthopnea, paroxysmal nocturnal dyspnea or pedal edema  Significant tests/ events reviewed   PFTs 03/2014 ratio of 81, FEV1 60%, FVC 60% and DLCO of 62%  Chest x-ray 02/2017 medically and pulmonary vascular congestion  Echo 05/2016 normal LV function, RVSP could not be estimated Nuclear stress test 06/2016 low risk   Review of Systems neg for any significant sore throat, dysphagia, itching, sneezing, nasal congestion or excess/ purulent secretions, fever, chills, sweats, unintended wt loss, pleuritic or exertional cp, hempoptysis, orthopnea pnd or change in chronic leg swelling. Also denies presyncope, palpitations, heartburn, abdominal pain, nausea, vomiting, diarrhea or change in bowel or urinary habits, dysuria,hematuria, rash, arthralgias, visual complaints,  headache, numbness weakness or ataxia.     Objective:   Physical Exam   Gen. Pleasant, obese, in no distress ENT - no lesions, no post nasal drip Neck: No JVD, no thyromegaly, no carotid bruits Lungs: no use of accessory muscles, no dullness to percussion, decreased without rales or rhonchi  Cardiovascular: Rhythm regular, heart sounds  normal, no murmurs or gallops, no peripheral edema Musculoskeletal: No deformities, no cyanosis or clubbing , no tremors        Assessment & Plan:

## 2017-08-01 NOTE — Progress Notes (Signed)
Patient seen in the office today and instructed on use of Anoro.  Patient expressed understanding and demonstrated technique. 

## 2017-08-01 NOTE — Addendum Note (Signed)
Addended by: Lorretta Harp on: 08/01/2017 11:00 AM   Modules accepted: Orders

## 2017-08-01 NOTE — Addendum Note (Signed)
Addended by: Lorretta Harp on: 08/01/2017 11:08 AM   Modules accepted: Orders

## 2017-08-01 NOTE — Assessment & Plan Note (Signed)
Currently controlled Although echo suggests grade 1 diastolic dysfunction, he does not seem to be in overt heart failure

## 2017-08-01 NOTE — Patient Instructions (Signed)
Trial of ANORO once daily- take this instead of INCRUSE  Referral to pulmonary rehabilitation program

## 2017-08-01 NOTE — Assessment & Plan Note (Signed)
As PFTs mostly shows restriction, I'm not convinced that he has significant COPD He does have difficulty using his current inhaler Trial of ANORO once daily- take this instead of INCRUSE  Referral to pulmonary rehabilitation program -I do feel that deconditioning is a big part of his symptoms His sister will drive him to the program

## 2017-08-01 NOTE — Assessment & Plan Note (Signed)
He likely has underlying OSA Can schedule home sleep study in the future

## 2017-08-09 ENCOUNTER — Other Ambulatory Visit: Payer: Self-pay | Admitting: Pharmacist

## 2017-08-09 DIAGNOSIS — I2510 Atherosclerotic heart disease of native coronary artery without angina pectoris: Secondary | ICD-10-CM

## 2017-08-09 MED ORDER — ATORVASTATIN CALCIUM 40 MG PO TABS
40.0000 mg | ORAL_TABLET | Freq: Every day | ORAL | 0 refills | Status: DC
Start: 2017-08-09 — End: 2018-05-07

## 2017-08-22 ENCOUNTER — Other Ambulatory Visit: Payer: Self-pay | Admitting: Cardiology

## 2017-08-28 ENCOUNTER — Other Ambulatory Visit: Payer: Self-pay | Admitting: Emergency Medicine

## 2017-09-10 ENCOUNTER — Telehealth (HOSPITAL_COMMUNITY): Payer: Self-pay

## 2017-09-10 NOTE — Telephone Encounter (Signed)
Called patient 3 times - left voicemail, sent letter, no response from patient. Closed referral.

## 2017-09-17 ENCOUNTER — Encounter: Payer: Self-pay | Admitting: Cardiovascular Disease

## 2017-09-17 ENCOUNTER — Ambulatory Visit (INDEPENDENT_AMBULATORY_CARE_PROVIDER_SITE_OTHER): Payer: Medicare Other | Admitting: Cardiovascular Disease

## 2017-09-17 ENCOUNTER — Telehealth: Payer: Self-pay | Admitting: Emergency Medicine

## 2017-09-17 VITALS — BP 112/70 | HR 81 | Ht 64.0 in | Wt 195.2 lb

## 2017-09-17 DIAGNOSIS — E782 Mixed hyperlipidemia: Secondary | ICD-10-CM | POA: Diagnosis not present

## 2017-09-17 DIAGNOSIS — Z23 Encounter for immunization: Secondary | ICD-10-CM | POA: Diagnosis not present

## 2017-09-17 DIAGNOSIS — I25119 Atherosclerotic heart disease of native coronary artery with unspecified angina pectoris: Secondary | ICD-10-CM | POA: Diagnosis not present

## 2017-09-17 DIAGNOSIS — I2510 Atherosclerotic heart disease of native coronary artery without angina pectoris: Secondary | ICD-10-CM | POA: Diagnosis not present

## 2017-09-17 LAB — LIPID PANEL
CHOLESTEROL TOTAL: 145 mg/dL (ref 100–199)
Chol/HDL Ratio: 3 ratio (ref 0.0–5.0)
HDL: 49 mg/dL (ref >39)
LDL Calculated: 70 mg/dL (ref 0–99)
Triglycerides: 132 mg/dL (ref 0–149)
VLDL Cholesterol Cal: 26 mg/dL (ref 5–40)

## 2017-09-17 LAB — HEPATIC FUNCTION PANEL
ALBUMIN: 4.3 g/dL (ref 3.6–4.8)
ALK PHOS: 113 IU/L (ref 39–117)
ALT: 45 IU/L — ABNORMAL HIGH (ref 0–44)
AST: 25 IU/L (ref 0–40)
BILIRUBIN, DIRECT: 0.14 mg/dL (ref 0.00–0.40)
Bilirubin Total: 0.5 mg/dL (ref 0.0–1.2)
TOTAL PROTEIN: 7 g/dL (ref 6.0–8.5)

## 2017-09-17 LAB — BASIC METABOLIC PANEL
BUN / CREAT RATIO: 9 — AB (ref 10–24)
BUN: 11 mg/dL (ref 8–27)
CO2: 26 mmol/L (ref 20–29)
CREATININE: 1.17 mg/dL (ref 0.76–1.27)
Calcium: 9.7 mg/dL (ref 8.6–10.2)
Chloride: 98 mmol/L (ref 96–106)
GFR calc Af Amer: 76 mL/min/{1.73_m2} (ref >59)
GFR calc non Af Amer: 65 mL/min/{1.73_m2} (ref >59)
GLUCOSE: 94 mg/dL (ref 65–99)
Potassium: 4.3 mmol/L (ref 3.5–5.2)
Sodium: 139 mmol/L (ref 134–144)

## 2017-09-17 MED ORDER — UMECLIDINIUM-VILANTEROL 62.5-25 MCG/INH IN AEPB: 1.0000 | INHALATION_SPRAY | Freq: Every day | RESPIRATORY_TRACT | 3 refills | Status: DC
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
## 2017-09-17 NOTE — Telephone Encounter (Signed)
Pt aware that Rx has been sent to preferred pharmacy. Nothing further needed.

## 2017-09-17 NOTE — Patient Instructions (Signed)
Medication Instructions:  Your physician recommends that you continue on your current medications as directed. Please refer to the Current Medication list given to you today.   Labwork: TODAY - basic metabolic panel, cholesterol, liver panel   Testing/Procedures: None Ordered   Follow-Up: Your physician wants you to follow-up in: 1 year with Dr. Acie Fredrickson.  You will receive a reminder letter in the mail two months in advance. If you don't receive a letter, please call our office to schedule the follow-up appointment.   If you need a refill on your cardiac medications before your next appointment, please call your pharmacy.   Thank you for choosing [facility]! Christen Bame, RN [phone]

## 2017-09-17 NOTE — Telephone Encounter (Signed)
ATC pt, no answer. Left message for pt to call back. Anoro sent.

## 2017-09-17 NOTE — Telephone Encounter (Signed)
Pt returned phone call; advised patient the medication was entered and sent to  [facility] pharmacy

## 2017-09-22 ENCOUNTER — Other Ambulatory Visit: Payer: Self-pay | Admitting: Cardiovascular Disease

## 2017-09-30 ENCOUNTER — Other Ambulatory Visit: Payer: Self-pay | Admitting: Cardiovascular Disease

## 2017-11-03 ENCOUNTER — Other Ambulatory Visit: Payer: Self-pay | Admitting: Cardiovascular Disease

## 2017-11-19 IMAGING — DX DG CHEST 2V
2 series · 2 of 2 positions shown · non-contrast
Comparison: 02/28/2016

CLINICAL DATA: Chest pain since [REDACTED], cough.

EXAM:
CHEST  2 VIEW

[chest ap]
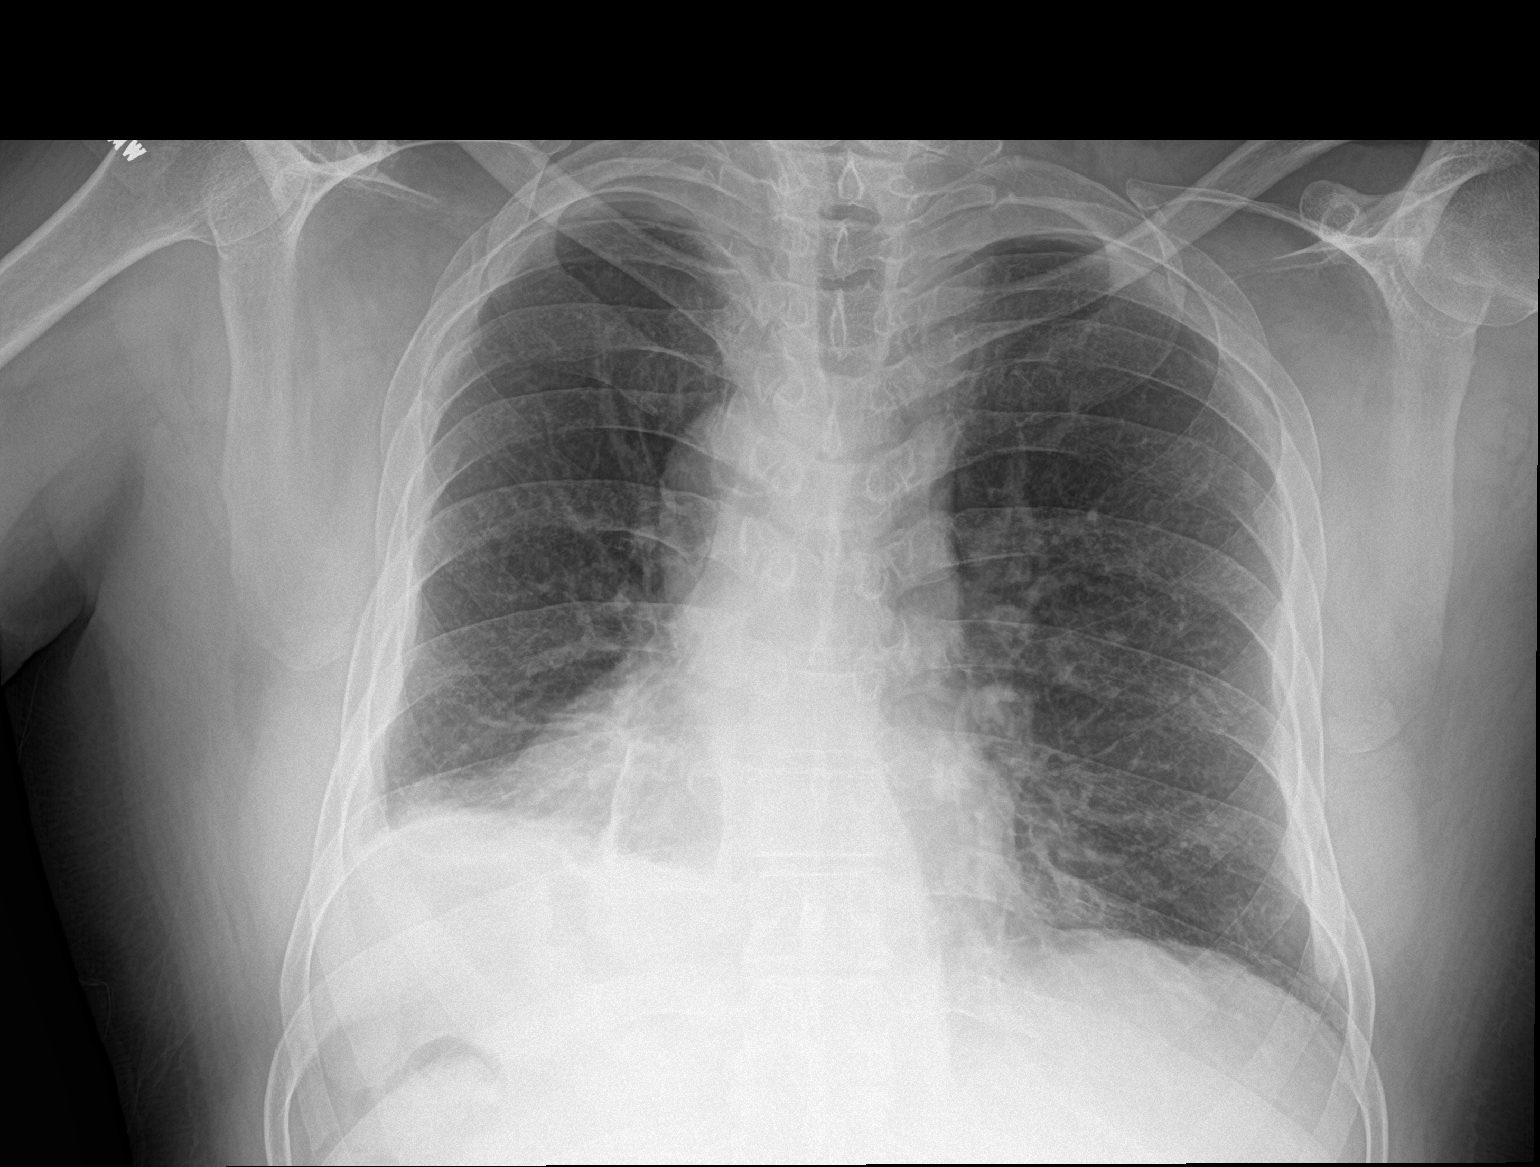

[chest lat]
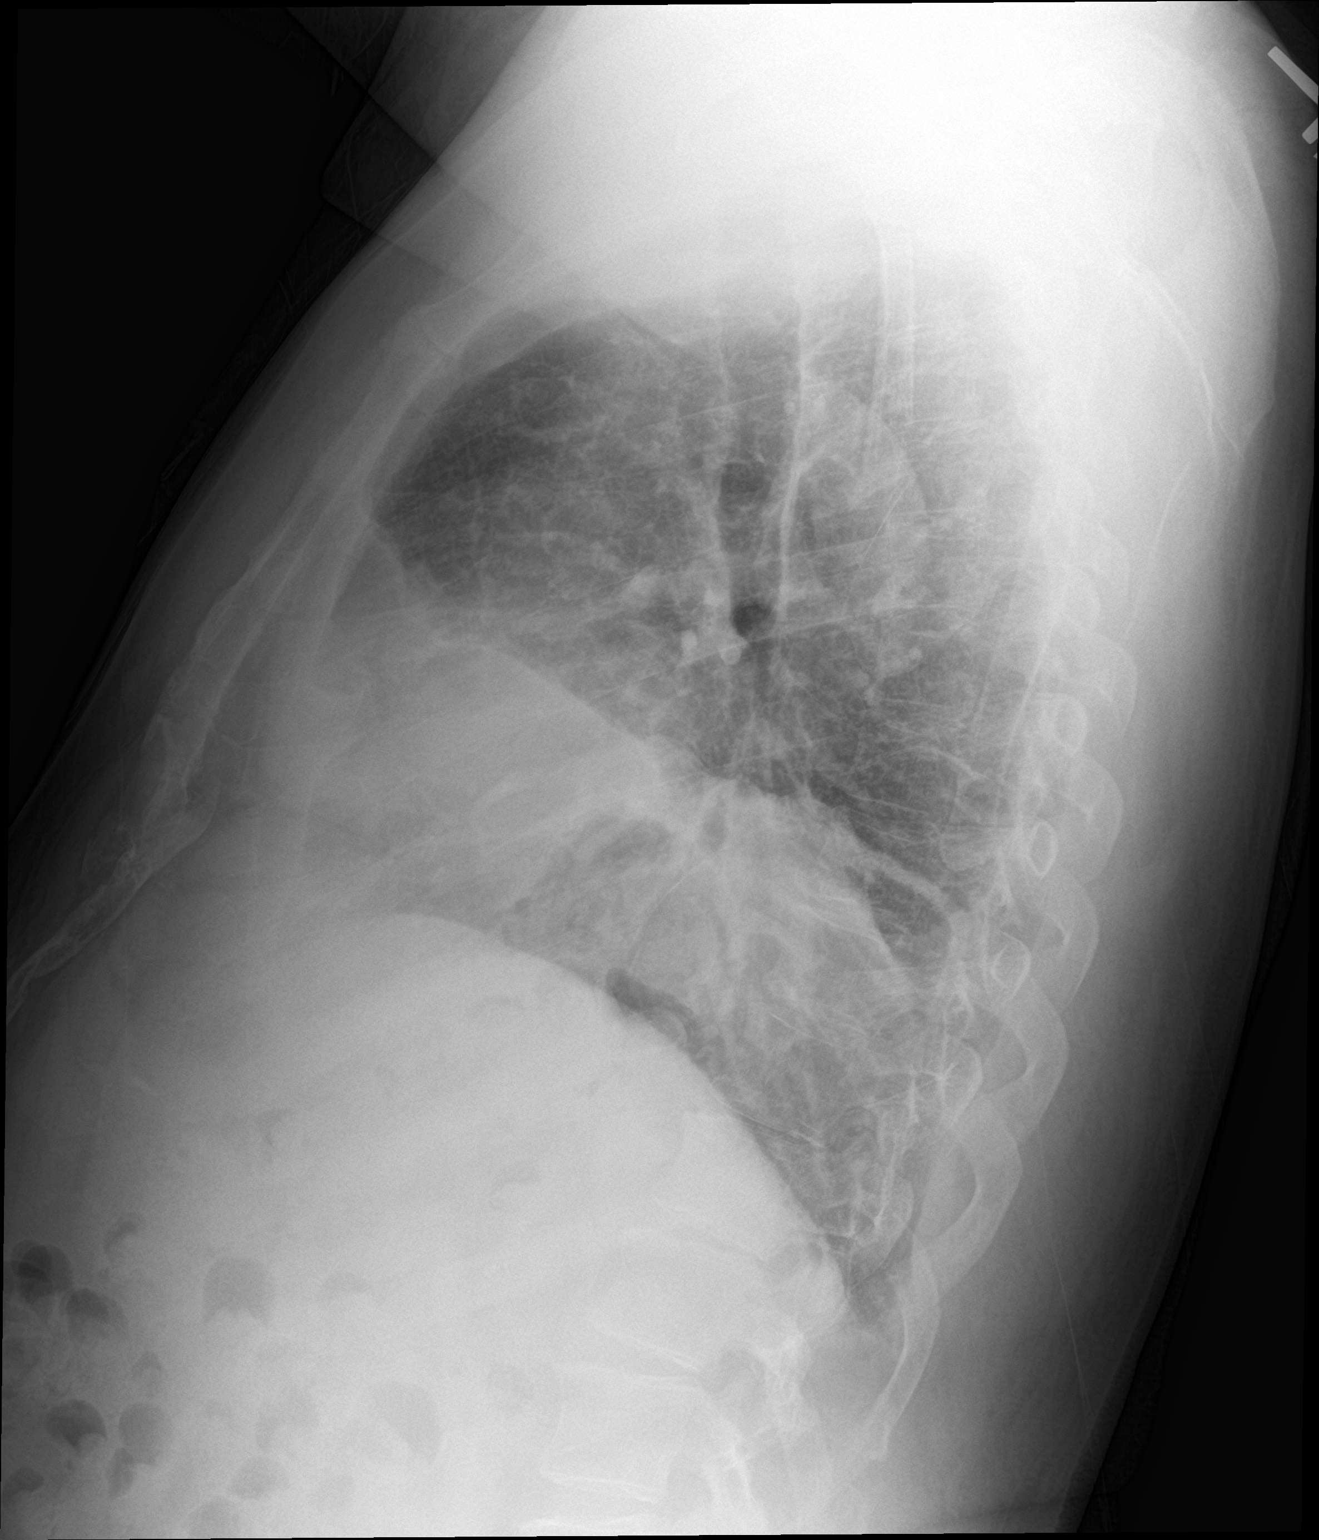

[2 of 2 positions shown; findings below may reference images not displayed]

FINDINGS: Linear densities at the left base, stable compatible with scarring.
Right lung is clear. Heart is normal size. No effusions or acute
bony abnormality.
IMPRESSION: Left basilar scarring.  No active disease.

## 2018-01-31 ENCOUNTER — Other Ambulatory Visit: Payer: Self-pay | Admitting: Emergency Medicine

## 2018-02-10 ENCOUNTER — Encounter: Payer: Self-pay | Admitting: Acute Care

## 2018-02-10 ENCOUNTER — Ambulatory Visit (INDEPENDENT_AMBULATORY_CARE_PROVIDER_SITE_OTHER): Payer: Medicare Other | Admitting: Acute Care

## 2018-02-10 VITALS — BP 142/82 | HR 78 | Ht 63.0 in | Wt 195.8 lb

## 2018-02-10 DIAGNOSIS — J439 Emphysema, unspecified: Secondary | ICD-10-CM | POA: Diagnosis not present

## 2018-02-10 DIAGNOSIS — G4733 Obstructive sleep apnea (adult) (pediatric): Secondary | ICD-10-CM | POA: Diagnosis not present

## 2018-02-10 NOTE — Patient Instructions (Addendum)
It is nice to meet you today. Continue your Anoro  1 puff once daily. Rinse mouth after use. Continue using your rescue inhaler for breakthrough shortness of breath. We will schedule Pulmonary Function tests to see if your COPD has become worse We will schedule a home sleep study to make sure you don't have sleep apnea We will call you with results. We will recommend nasal saline spray for your nasal dryness. You can get this at [facility], [facility] or [facility].( Get generic) Follow up in 4 weeks after PFT's and sleep test. Please contact office for sooner follow up if symptoms do not improve or worsen or seek emergency care

## 2018-02-10 NOTE — Assessment & Plan Note (Signed)
At baseline Compliant with Anoro Needs PFT's to eval for worsening pulmonary function vs. Deconditioning Needs home sleep study Plan Continue your Anoro  1 puff once daily. Rinse mouth after use. Continue using your rescue inhaler for breakthrough shortness of breath. We will schedule Pulmonary Function tests to see if your COPD has become worse We will schedule a home sleep study to make sure you don't have sleep apnea We will call you with results. We will recommend nasal saline spray for your nasal dryness. You can get this at [facility], [facility] or [facility].( Get generic) Follow up in 4 weeks after PFT's and sleep test. Please contact office for sooner follow up if symptoms do not improve or worsen or seek emergency care
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
## 2018-02-11 ENCOUNTER — Other Ambulatory Visit: Payer: Self-pay | Admitting: Emergency Medicine

## 2018-02-14 ENCOUNTER — Ambulatory Visit: Payer: Medicare Other | Attending: Internal Medicine | Admitting: Internal Medicine

## 2018-02-14 ENCOUNTER — Other Ambulatory Visit: Payer: Self-pay

## 2018-02-14 ENCOUNTER — Encounter: Payer: Self-pay | Admitting: Internal Medicine

## 2018-02-14 VITALS — BP 117/77 | HR 85 | Temp 97.9°F | Resp 18 | Ht 64.0 in | Wt 195.2 lb

## 2018-02-14 DIAGNOSIS — K219 Gastro-esophageal reflux disease without esophagitis: Secondary | ICD-10-CM | POA: Insufficient documentation

## 2018-02-14 DIAGNOSIS — I10 Essential (primary) hypertension: Secondary | ICD-10-CM

## 2018-02-14 DIAGNOSIS — I25119 Atherosclerotic heart disease of native coronary artery with unspecified angina pectoris: Secondary | ICD-10-CM | POA: Insufficient documentation

## 2018-02-14 DIAGNOSIS — E039 Hypothyroidism, unspecified: Secondary | ICD-10-CM | POA: Diagnosis not present

## 2018-02-14 DIAGNOSIS — Z9189 Other specified personal risk factors, not elsewhere classified: Secondary | ICD-10-CM | POA: Diagnosis not present

## 2018-02-14 DIAGNOSIS — M549 Dorsalgia, unspecified: Secondary | ICD-10-CM | POA: Diagnosis present

## 2018-02-14 DIAGNOSIS — R350 Frequency of micturition: Secondary | ICD-10-CM

## 2018-02-14 DIAGNOSIS — E785 Hyperlipidemia, unspecified: Secondary | ICD-10-CM | POA: Insufficient documentation

## 2018-02-14 DIAGNOSIS — J449 Chronic obstructive pulmonary disease, unspecified: Secondary | ICD-10-CM | POA: Insufficient documentation

## 2018-02-14 DIAGNOSIS — N179 Acute kidney failure, unspecified: Secondary | ICD-10-CM | POA: Diagnosis not present

## 2018-02-14 DIAGNOSIS — Z79890 Hormone replacement therapy (postmenopausal): Secondary | ICD-10-CM | POA: Diagnosis not present

## 2018-02-14 DIAGNOSIS — Z131 Encounter for screening for diabetes mellitus: Secondary | ICD-10-CM

## 2018-02-14 DIAGNOSIS — M545 Low back pain, unspecified: Secondary | ICD-10-CM

## 2018-02-14 DIAGNOSIS — Z7902 Long term (current) use of antithrombotics/antiplatelets: Secondary | ICD-10-CM | POA: Diagnosis not present

## 2018-02-14 DIAGNOSIS — Z79899 Other long term (current) drug therapy: Secondary | ICD-10-CM | POA: Diagnosis not present

## 2018-02-14 DIAGNOSIS — I2510 Atherosclerotic heart disease of native coronary artery without angina pectoris: Secondary | ICD-10-CM | POA: Diagnosis not present

## 2018-02-14 DIAGNOSIS — N189 Chronic kidney disease, unspecified: Secondary | ICD-10-CM | POA: Insufficient documentation

## 2018-02-14 DIAGNOSIS — I129 Hypertensive chronic kidney disease with stage 1 through stage 4 chronic kidney disease, or unspecified chronic kidney disease: Secondary | ICD-10-CM | POA: Diagnosis not present

## 2018-02-14 DIAGNOSIS — E1122 Type 2 diabetes mellitus with diabetic chronic kidney disease: Secondary | ICD-10-CM | POA: Insufficient documentation

## 2018-02-14 DIAGNOSIS — Z8249 Family history of ischemic heart disease and other diseases of the circulatory system: Secondary | ICD-10-CM | POA: Insufficient documentation

## 2018-02-14 DIAGNOSIS — Z833 Family history of diabetes mellitus: Secondary | ICD-10-CM | POA: Insufficient documentation

## 2018-02-14 DIAGNOSIS — Z9889 Other specified postprocedural states: Secondary | ICD-10-CM | POA: Diagnosis not present

## 2018-02-14 DIAGNOSIS — Z955 Presence of coronary angioplasty implant and graft: Secondary | ICD-10-CM | POA: Insufficient documentation

## 2018-02-14 DIAGNOSIS — Z888 Allergy status to other drugs, medicaments and biological substances status: Secondary | ICD-10-CM | POA: Diagnosis not present

## 2018-02-14 DIAGNOSIS — N401 Enlarged prostate with lower urinary tract symptoms: Secondary | ICD-10-CM | POA: Diagnosis not present

## 2018-02-14 DIAGNOSIS — E669 Obesity, unspecified: Secondary | ICD-10-CM | POA: Insufficient documentation

## 2018-02-14 DIAGNOSIS — Z6833 Body mass index (BMI) 33.0-33.9, adult: Secondary | ICD-10-CM | POA: Insufficient documentation

## 2018-02-14 DIAGNOSIS — Z7982 Long term (current) use of aspirin: Secondary | ICD-10-CM | POA: Diagnosis not present

## 2018-02-14 DIAGNOSIS — Z87891 Personal history of nicotine dependence: Secondary | ICD-10-CM | POA: Insufficient documentation

## 2018-02-14 LAB — POCT URINALYSIS DIPSTICK
BILIRUBIN UA: NEGATIVE
Glucose, UA: NEGATIVE
KETONES UA: NEGATIVE
Leukocytes, UA: NEGATIVE
Nitrite, UA: NEGATIVE
PH UA: 6.5 (ref 5.0–8.0)
RBC UA: NEGATIVE
Spec Grav, UA: 1.015 (ref 1.010–1.025)
UROBILINOGEN UA: 0.2 U/dL

## 2018-02-14 LAB — POCT GLYCOSYLATED HEMOGLOBIN (HGB A1C): Hemoglobin A1C: 6.5

## 2018-02-14 MED ORDER — METFORMIN HCL 500 MG PO TABS: 250.0000 mg | ORAL_TABLET | Freq: Every day | ORAL | 3 refills | Status: DC

## 2018-02-14 MED ORDER — TAMSULOSIN HCL 0.4 MG PO CAPS: 0.4000 mg | ORAL_CAPSULE | Freq: Every day | ORAL | 3 refills | Status: DC
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
## 2018-02-14 NOTE — Patient Instructions (Addendum)
Start Flomax 0.4 mg daily for your urinary symptoms.    Benign Prostatic Hyperplasia Benign prostatic hyperplasia (BPH) is an enlarged prostate gland that is caused by the normal aging process and not by cancer. The prostate is a walnut-sized gland that is involved in the production of semen. It is located in front of the rectum and below the bladder. The bladder stores urine and the urethra is the tube that carries the urine out of the body. The prostate may get bigger as a man gets older. An enlarged prostate can press on the urethra. This can make it harder to pass urine. The build-up of urine in the bladder can cause infection. Back pressure and infection may progress to bladder damage and kidney (renal) failure. What are the causes? This condition is part of a normal aging process. However, not all men develop problems from this condition. If the prostate enlarges away from the urethra, urine flow will not be blocked. If it enlarges toward the urethra and compresses it, there will be problems passing urine. What increases the risk? This condition is more likely to develop in men over the age of 50 years. What are the signs or symptoms? Symptoms of this condition include:  Getting up often during the night to urinate.  Needing to urinate frequently during the day.  Difficulty starting urine flow.  Decrease in size and strength of your urine stream.  Leaking (dribbling) after urinating.  Inability to pass urine. This needs immediate treatment.  Inability to completely empty your bladder.  Pain when you pass urine. This is more common if there is also an infection.  Urinary tract infection (UTI).  How is this diagnosed? This condition is diagnosed based on your medical history, a physical exam, and your symptoms. Tests will also be done, such as:  A post-void bladder scan. This measures any amount of urine that may remain in your bladder after you finish urinating.  A digital  rectal exam. In a rectal exam, your health care provider checks your prostate by putting a lubricated, gloved finger into your rectum to feel the back of your prostate gland. This exam detects the size of your gland and any abnormal lumps or growths.  An exam of your urine (urinalysis).  A prostate specific antigen (PSA) screening. This is a blood test used to screen for prostate cancer.  An ultrasound. This test uses sound waves to electronically produce a picture of your prostate gland.  Your health care provider may refer you to a specialist in kidney and prostate diseases (urologist). How is this treated? Once symptoms begin, your health care provider will monitor your condition (active surveillance or watchful waiting). Treatment for this condition will depend on the severity of your condition. Treatment may include:  Observation and yearly exams. This may be the only treatment needed if your condition and symptoms are mild.  Medicines to relieve your symptoms, including: ? Medicines to shrink the prostate. ? Medicines to relax the muscle of the prostate.  Surgery in severe cases. Surgery may include: ? Prostatectomy. In this procedure, the prostate tissue is removed completely through an open incision or with a laparascope or robotics. ? Transurethral resection of the prostate (TURP). In this procedure, a tool is inserted through the opening at the tip of the penis (urethra). It is used to cut away tissue of the inner core of the prostate. The pieces are removed through the same opening of the penis. This removes the blockage. ? Transurethral incision (  TUIP). In this procedure, small cuts are made in the prostate. This lessens the prostate's pressure on the urethra. ? Transurethral microwave thermotherapy (TUMT). This procedure uses microwaves to create heat. The heat destroys and removes a small amount of prostate tissue. ? Transurethral needle ablation (TUNA). This procedure uses radio  frequencies to destroy and remove a small amount of prostate tissue. ? Interstitial laser coagulation ([place]). This procedure uses a laser to destroy and remove a small amount of prostate tissue. ? Transurethral electrovaporization (TUVP). This procedure uses electrodes to destroy and remove a small amount of prostate tissue. ? Prostatic urethral lift. This procedure inserts an implant to push the lobes of the prostate away from the urethra.  Follow these instructions at home:  Take over-the-counter and prescription medicines only as told by your health care provider.  Monitor your symptoms for any changes. Contact your health care provider with any changes.  Avoid drinking large amounts of liquid before going to bed or out in public.  Avoid or reduce how much caffeine or alcohol you drink.  Give yourself time when you urinate.  Keep all follow-up visits as told by your health care provider. This is important. Contact a health care provider if:  You have unexplained back pain.  Your symptoms do not get better with treatment.  You develop side effects from the medicine you are taking.  Your urine becomes very dark or has a bad smell.  Your lower abdomen becomes distended and you have trouble passing your urine. Get help right away if:  You have a fever or chills.  You suddenly cannot urinate.  You feel lightheaded, or very dizzy, or you faint.  There are large amounts of blood or clots in the urine.  Your urinary problems become hard to manage.  You develop moderate to severe low back or flank pain. The flank is the side of your body between the ribs and the hip. These symptoms may represent a serious problem that is an emergency. Do not wait to see if the symptoms will go away. Get medical help right away. Call your local emergency services (911 in the [place] Do not drive yourself to the hospital. Summary  Benign prostatic hyperplasia (BPH) is an enlarged prostate that is  caused by the normal aging process and not by cancer.  An enlarged prostate can press on the urethra. This can make it hard to pass urine.  This condition is part of a normal aging process and is more likely to develop in men over the age of 53 years.  Get help right away if you suddenly cannot urinate. This information is not intended to replace advice given to you by your health care provider. Make sure you discuss any questions you have with your health care provider. Document Released: 11/12/2005 Document Revised: 12/17/2016 Document Reviewed: 12/17/2016 Elsevier Interactive Patient Education  [employer].

## 2018-02-14 NOTE — Progress Notes (Signed)
Patient is here for frequent urination   Burning sensation & back pain

## 2018-02-15 LAB — URINE CULTURE

## 2018-02-15 LAB — PSA: Prostate Specific Ag, Serum: 0.9 ng/mL (ref 0.0–4.0)

## 2018-02-17 ENCOUNTER — Telehealth: Payer: Self-pay

## 2018-02-17 NOTE — Telephone Encounter (Signed)
Contacted pt to go over lab results pt is aware and doesn't have any questions or concerns 

## 2018-03-01 DIAGNOSIS — G4733 Obstructive sleep apnea (adult) (pediatric): Secondary | ICD-10-CM | POA: Diagnosis not present

## 2018-03-02 DIAGNOSIS — G4733 Obstructive sleep apnea (adult) (pediatric): Secondary | ICD-10-CM | POA: Diagnosis not present

## 2018-03-03 ENCOUNTER — Other Ambulatory Visit: Payer: Self-pay | Admitting: *Deleted

## 2018-03-03 DIAGNOSIS — G4733 Obstructive sleep apnea (adult) (pediatric): Secondary | ICD-10-CM

## 2018-03-19 ENCOUNTER — Other Ambulatory Visit: Payer: Self-pay

## 2018-03-19 MED ORDER — CETIRIZINE HCL 10 MG PO TABS: 10.0000 mg | ORAL_TABLET | Freq: Every day | ORAL | 2 refills | Status: DC

## 2018-03-24 ENCOUNTER — Encounter: Payer: Self-pay | Admitting: Acute Care

## 2018-03-24 ENCOUNTER — Ambulatory Visit (INDEPENDENT_AMBULATORY_CARE_PROVIDER_SITE_OTHER): Payer: Medicare Other | Admitting: Emergency Medicine

## 2018-03-24 ENCOUNTER — Ambulatory Visit (INDEPENDENT_AMBULATORY_CARE_PROVIDER_SITE_OTHER): Payer: Medicare Other | Admitting: Acute Care

## 2018-03-24 VITALS — BP 120/78 | HR 84 | Ht 63.0 in

## 2018-03-24 DIAGNOSIS — Z6835 Body mass index (BMI) 35.0-35.9, adult: Secondary | ICD-10-CM

## 2018-03-24 DIAGNOSIS — I2510 Atherosclerotic heart disease of native coronary artery without angina pectoris: Secondary | ICD-10-CM | POA: Diagnosis not present

## 2018-03-24 DIAGNOSIS — J439 Emphysema, unspecified: Secondary | ICD-10-CM

## 2018-03-24 DIAGNOSIS — G4733 Obstructive sleep apnea (adult) (pediatric): Secondary | ICD-10-CM | POA: Insufficient documentation

## 2018-03-24 LAB — PULMONARY FUNCTION TEST
DL/VA % pred: 96 %
DL/VA: 4.01 ml/min/mmHg/L
DLCO unc % pred: 40 %
DLCO unc: 9.54 ml/min/mmHg
FEF 25-75 Post: 1.2 L/sec
FEF 25-75 Pre: 1.03 L/sec
FEF2575-%Change-Post: 16 %
FEF2575-%Pred-Post: 56 %
FEF2575-%Pred-Pre: 49 %
FEV1-%Change-Post: 0 %
FEV1-%Pred-Post: 52 %
FEV1-%Pred-Pre: 51 %
FEV1-Post: 1.18 L
FEV1-Pre: 1.17 L
FEV1FVC-%Change-Post: 7 %
FEV1FVC-%Pred-Pre: 105 %
FEV6-%Change-Post: -6 %
FEV6-%Pred-Post: 47 %
FEV6-%Pred-Pre: 50 %
FEV6-Post: 1.36 L
FEV6-Pre: 1.44 L
FEV6FVC-%Pred-Post: 105 %
FEV6FVC-%Pred-Pre: 105 %
FVC-%Change-Post: -6 %
FVC-%Pred-Post: 45 %
FVC-%Pred-Pre: 48 %
FVC-Post: 1.36 L
FVC-Pre: 1.44 L
Post FEV1/FVC ratio: 87 %
Post FEV6/FVC ratio: 100 %
Pre FEV1/FVC ratio: 81 %
Pre FEV6/FVC Ratio: 100 %
RV % pred: 100 %
RV: 2.01 L
TLC % pred: 60 %
TLC: 3.43 L

## 2018-03-24 MED ORDER — FLUTICASONE-UMECLIDIN-VILANT 100-62.5-25 MCG/INH IN AEPB: 1.0000 | INHALATION_SPRAY | Freq: Every day | RESPIRATORY_TRACT | 0 refills | Status: DC

## 2018-03-24 NOTE — Progress Notes (Signed)
PFT done today. 

## 2018-03-24 NOTE — Patient Instructions (Addendum)
It is nice to see you today. We will send in a prescription for a  new inhaler called Trelegy. Use this one puff once a day every day. Rinse mouth after use. Stop,Anoro while on Trelegy Use Delsym at night for cough. 1 teaspoon every 12 hours as needed. Your Sleep study shows moderate sleep apnea with hypopnea causing severe oxygen desaturations. You will need CPAP therapy. We will schedule you for a CPAP titration to determine best therapy option for you. Follow up in 1 month with Seraphina Mitchner or Dr.Byrum  to ensure your doing well on the Trelegy. Follow up after CPAP titration. Refer to pulmonary rehab under Dr. Lamonte Sakai Please contact office for sooner follow up if symptoms do not improve or worsen or seek emergency care

## 2018-03-24 NOTE — Assessment & Plan Note (Signed)
Obese Plan: Work on weight loss Refer to pulmonary rehab
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
## 2018-03-24 NOTE — Assessment & Plan Note (Signed)
  Home Sleep diagnostic test 03/01/2018>> Moderate OSA with hypopneas causing oxygen desaturations  (severe). AHI is 15.5, 19 obstructive,3 central, 8 mixed. Pt. Is agreeable to CPAP therapy Plan: Will need CPAP titration study to determine optimal settings and need for oxygen Follow up after titration study . We will order CPAP machine once cpap titration is completed.

## 2018-03-24 NOTE — Assessment & Plan Note (Signed)
PFT's show worsening disease Plan: We will send in a prescription for a  new inhaler called Trelegy. Use this one puff once a day every day. Rinse mouth after use. Stop,Anoro while on Trelegy Use Delsym at night for cough. 1 teaspoon every 12 hours as needed. Your Sleep study shows moderate sleep apnea with hypopnea causing severe oxygen desaturations. You will need CPAP therapy. We will schedule you for a CPAP titration to determine best therapy option for you. Follow up in 1 month with Markiyah Gahm or Dr.Byrum  to ensure your doing well on the Trelegy. Follow up after CPAP titration. Refer to pulmonary rehab under Dr. Lamonte Sakai Please contact office for sooner follow up if symptoms do not improve or worsen or seek emergency care

## 2018-03-28 ENCOUNTER — Encounter: Payer: Self-pay | Admitting: Internal Medicine

## 2018-03-28 ENCOUNTER — Ambulatory Visit: Payer: Medicare Other | Attending: Internal Medicine | Admitting: Internal Medicine

## 2018-03-28 VITALS — BP 121/79 | HR 72 | Temp 98.1°F | Resp 16 | Wt 190.2 lb

## 2018-03-28 DIAGNOSIS — R350 Frequency of micturition: Secondary | ICD-10-CM

## 2018-03-28 DIAGNOSIS — G4733 Obstructive sleep apnea (adult) (pediatric): Secondary | ICD-10-CM | POA: Diagnosis not present

## 2018-03-28 DIAGNOSIS — Z7982 Long term (current) use of aspirin: Secondary | ICD-10-CM | POA: Diagnosis not present

## 2018-03-28 DIAGNOSIS — N189 Chronic kidney disease, unspecified: Secondary | ICD-10-CM | POA: Diagnosis not present

## 2018-03-28 DIAGNOSIS — Z6833 Body mass index (BMI) 33.0-33.9, adult: Secondary | ICD-10-CM | POA: Diagnosis not present

## 2018-03-28 DIAGNOSIS — Z833 Family history of diabetes mellitus: Secondary | ICD-10-CM | POA: Diagnosis not present

## 2018-03-28 DIAGNOSIS — Z87891 Personal history of nicotine dependence: Secondary | ICD-10-CM | POA: Diagnosis not present

## 2018-03-28 DIAGNOSIS — Z888 Allergy status to other drugs, medicaments and biological substances status: Secondary | ICD-10-CM | POA: Insufficient documentation

## 2018-03-28 DIAGNOSIS — E039 Hypothyroidism, unspecified: Secondary | ICD-10-CM | POA: Insufficient documentation

## 2018-03-28 DIAGNOSIS — Z79890 Hormone replacement therapy (postmenopausal): Secondary | ICD-10-CM | POA: Insufficient documentation

## 2018-03-28 DIAGNOSIS — J449 Chronic obstructive pulmonary disease, unspecified: Secondary | ICD-10-CM

## 2018-03-28 DIAGNOSIS — Z800 Family history of malignant neoplasm of digestive organs: Secondary | ICD-10-CM | POA: Insufficient documentation

## 2018-03-28 DIAGNOSIS — N400 Enlarged prostate without lower urinary tract symptoms: Secondary | ICD-10-CM | POA: Diagnosis present

## 2018-03-28 DIAGNOSIS — Z8249 Family history of ischemic heart disease and other diseases of the circulatory system: Secondary | ICD-10-CM | POA: Insufficient documentation

## 2018-03-28 DIAGNOSIS — Z955 Presence of coronary angioplasty implant and graft: Secondary | ICD-10-CM | POA: Insufficient documentation

## 2018-03-28 DIAGNOSIS — R7303 Prediabetes: Secondary | ICD-10-CM | POA: Diagnosis not present

## 2018-03-28 DIAGNOSIS — Z7951 Long term (current) use of inhaled steroids: Secondary | ICD-10-CM | POA: Diagnosis not present

## 2018-03-28 DIAGNOSIS — Z7902 Long term (current) use of antithrombotics/antiplatelets: Secondary | ICD-10-CM | POA: Diagnosis not present

## 2018-03-28 DIAGNOSIS — I129 Hypertensive chronic kidney disease with stage 1 through stage 4 chronic kidney disease, or unspecified chronic kidney disease: Secondary | ICD-10-CM | POA: Diagnosis not present

## 2018-03-28 DIAGNOSIS — N401 Enlarged prostate with lower urinary tract symptoms: Secondary | ICD-10-CM | POA: Diagnosis not present

## 2018-03-28 DIAGNOSIS — E785 Hyperlipidemia, unspecified: Secondary | ICD-10-CM | POA: Diagnosis not present

## 2018-03-28 DIAGNOSIS — E669 Obesity, unspecified: Secondary | ICD-10-CM | POA: Diagnosis not present

## 2018-03-28 DIAGNOSIS — Z7984 Long term (current) use of oral hypoglycemic drugs: Secondary | ICD-10-CM | POA: Diagnosis not present

## 2018-03-28 DIAGNOSIS — Z23 Encounter for immunization: Secondary | ICD-10-CM | POA: Diagnosis not present

## 2018-03-28 DIAGNOSIS — Z79899 Other long term (current) drug therapy: Secondary | ICD-10-CM | POA: Insufficient documentation

## 2018-03-28 DIAGNOSIS — K219 Gastro-esophageal reflux disease without esophagitis: Secondary | ICD-10-CM | POA: Insufficient documentation

## 2018-03-28 LAB — GLUCOSE, POCT (MANUAL RESULT ENTRY): POC GLUCOSE: 99 mg/dL (ref 70–99)

## 2018-03-28 MED ORDER — PNEUMOCOCCAL 13-VAL CONJ VACC IM SUSP: 0.5000 mL | INTRAMUSCULAR | 0 refills | Status: AC
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
## 2018-03-28 NOTE — Patient Instructions (Signed)
Pneumococcal Conjugate Vaccine (PCV13) What You Need to Know 1. Why get vaccinated? Vaccination can protect both children and adults from pneumococcal disease. Pneumococcal disease is caused by bacteria that can spread from person to person through close contact. It can cause ear infections, and it can also lead to more serious infections of the:  Lungs (pneumonia),  Blood (bacteremia), and  Covering of the brain and spinal cord (meningitis).  Pneumococcal pneumonia is most common among adults. Pneumococcal meningitis can cause deafness and brain damage, and it kills about 1 child in 10 who get it. Anyone can get pneumococcal disease, but children under 2 years of age and adults 65 years and older, people with certain medical conditions, and cigarette smokers are at the highest risk. Before there was a vaccine, the [place] saw:  more than 700 cases of meningitis,  about 13,000 blood infections,  about 5 million ear infections, and  about 200 deaths  in children under 5 each year from pneumococcal disease. Since vaccine became available, severe pneumococcal disease in these children has fallen by 88%. About 18,000 older adults die of pneumococcal disease each year in the [place]. Treatment of pneumococcal infections with penicillin and other drugs is not as effective as it used to be, because some strains of the disease have become resistant to these drugs. This makes prevention of the disease, through vaccination, even more important. 2. PCV13 vaccine Pneumococcal conjugate vaccine (called PCV13) protects against 13 types of pneumococcal bacteria. PCV13 is routinely given to children at 2, 4, 6, and 12-15 months of age. It is also recommended for children and adults 2 to 64 years of age with certain health conditions, and for all adults 65 years of age and older. Your doctor can give you details. 3. Some people should not get this vaccine Anyone who has ever had a  life-threatening allergic reaction to a dose of this vaccine, to an earlier pneumococcal vaccine called PCV7, or to any vaccine containing diphtheria toxoid (for example, DTaP), should not get PCV13. Anyone with a severe allergy to any component of PCV13 should not get the vaccine. Tell your doctor if the person being vaccinated has any severe allergies. If the person scheduled for vaccination is not feeling well, your healthcare provider might decide to reschedule the shot on another day. 4. Risks of a vaccine reaction With any medicine, including vaccines, there is a chance of reactions. These are usually mild and go away on their own, but serious reactions are also possible. Problems reported following PCV13 varied by age and dose in the series. The most common problems reported among children were:  About half became drowsy after the shot, had a temporary loss of appetite, or had redness or tenderness where the shot was given.  About 1 out of 3 had swelling where the shot was given.  About 1 out of 3 had a mild fever, and about 1 in 20 had a fever over 102.2F.  Up to about 8 out of 10 became fussy or irritable.  Adults have reported pain, redness, and swelling where the shot was given; also mild fever, fatigue, headache, chills, or muscle pain. Young children who get PCV13 along with inactivated flu vaccine at the same time may be at increased risk for seizures caused by fever. Ask your doctor for more information. Problems that could happen after any vaccine:  People sometimes faint after a medical procedure, including vaccination. Sitting or lying down for about 15 minutes can help prevent   fainting, and injuries caused by a fall. Tell your doctor if you feel dizzy, or have vision changes or ringing in the ears.  Some older children and adults get severe pain in the shoulder and have difficulty moving the arm where a shot was given. This happens very rarely.  Any medication can cause a  severe allergic reaction. Such reactions from a vaccine are very rare, estimated at about 1 in a million doses, and would happen within a few minutes to a few hours after the vaccination. As with any medicine, there is a very small chance of a vaccine causing a serious injury or death. The safety of vaccines is always being monitored. For more information, visit: www.cdc.gov/vaccinesafety/ 5. What if there is a serious reaction? What should I look for? Look for anything that concerns you, such as signs of a severe allergic reaction, very high fever, or unusual behavior. Signs of a severe allergic reaction can include hives, swelling of the face and throat, difficulty breathing, a fast heartbeat, dizziness, and weakness-usually within a few minutes to a few hours after the vaccination. What should I do?  If you think it is a severe allergic reaction or other emergency that can't wait, call 9-1-1 or get the person to the nearest hospital. Otherwise, call your doctor.  Reactions should be reported to the Vaccine Adverse Event Reporting System (VAERS). Your doctor should file this report, or you can do it yourself through the VAERS web site at www.vaers.hhs.gov, or by calling 1-800-822-7967. ? VAERS does not give medical advice. 6. The National Vaccine Injury Compensation Program The National Vaccine Injury Compensation Program (VICP) is a federal program that was created to compensate people who may have been injured by certain vaccines. Persons who believe they may have been injured by a vaccine can learn about the program and about filing a claim by calling 1-800-338-2382 or visiting the VICP website at www.hrsa.gov/vaccinecompensation. There is a time limit to file a claim for compensation. 7. How can I learn more?  Ask your healthcare provider. He or she can give you the vaccine package insert or suggest other sources of information.  Call your local or state health department.  Contact the  Centers for Disease Control and Prevention (CDC): ? Call 1-800-232-4636 (1-800-CDC-INFO) or ? Visit CDC's website at www.cdc.gov/vaccines Vaccine Information Statement, PCV13 Vaccine (09/30/2014) This information is not intended to replace advice given to you by your health care provider. Make sure you discuss any questions you have with your health care provider. Document Released: 09/09/2006 Document Revised: 08/02/2016 Document Reviewed: 08/02/2016 Elsevier Interactive Patient Education  2017 Elsevier Inc.  

## 2018-04-10 ENCOUNTER — Telehealth (HOSPITAL_COMMUNITY): Payer: Self-pay

## 2018-04-10 NOTE — Telephone Encounter (Signed)
Called patient in regards to Pulmonary Rehab - Scheduled orientation on 04/25/18 at 9:30am. Patient will attend the 10:30am exc class. Mailed packet.

## 2018-04-14 ENCOUNTER — Ambulatory Visit (HOSPITAL_BASED_OUTPATIENT_CLINIC_OR_DEPARTMENT_OTHER): Payer: Medicare Other

## 2018-04-20 ENCOUNTER — Telehealth: Payer: Self-pay | Admitting: Internal Medicine

## 2018-04-20 DIAGNOSIS — E039 Hypothyroidism, unspecified: Secondary | ICD-10-CM

## 2018-04-20 MED ORDER — LEVOTHYROXINE SODIUM 125 MCG PO TABS: 125.0000 ug | ORAL_TABLET | Freq: Every day | ORAL | 2 refills | Status: DC

## 2018-04-20 NOTE — Telephone Encounter (Signed)
-----   Message from Jackelyn Knife, [place] sent at 04/18/2018 11:06 AM EDT ----- Pt is requesting his levothyroxine 161mcg. Last time TSH was drawn was 04/12/2017. Don't know if pt needs to continue same dose or need to have a TSH level drawn

## 2018-04-22 IMAGING — DX DG CHEST 2V
2 series · 2 of 2 positions shown · non-contrast
Comparison: 06/23/2016

CLINICAL DATA: Shortness of breath.  Vomiting.

EXAM:
CHEST  2 VIEW

[chest pa]
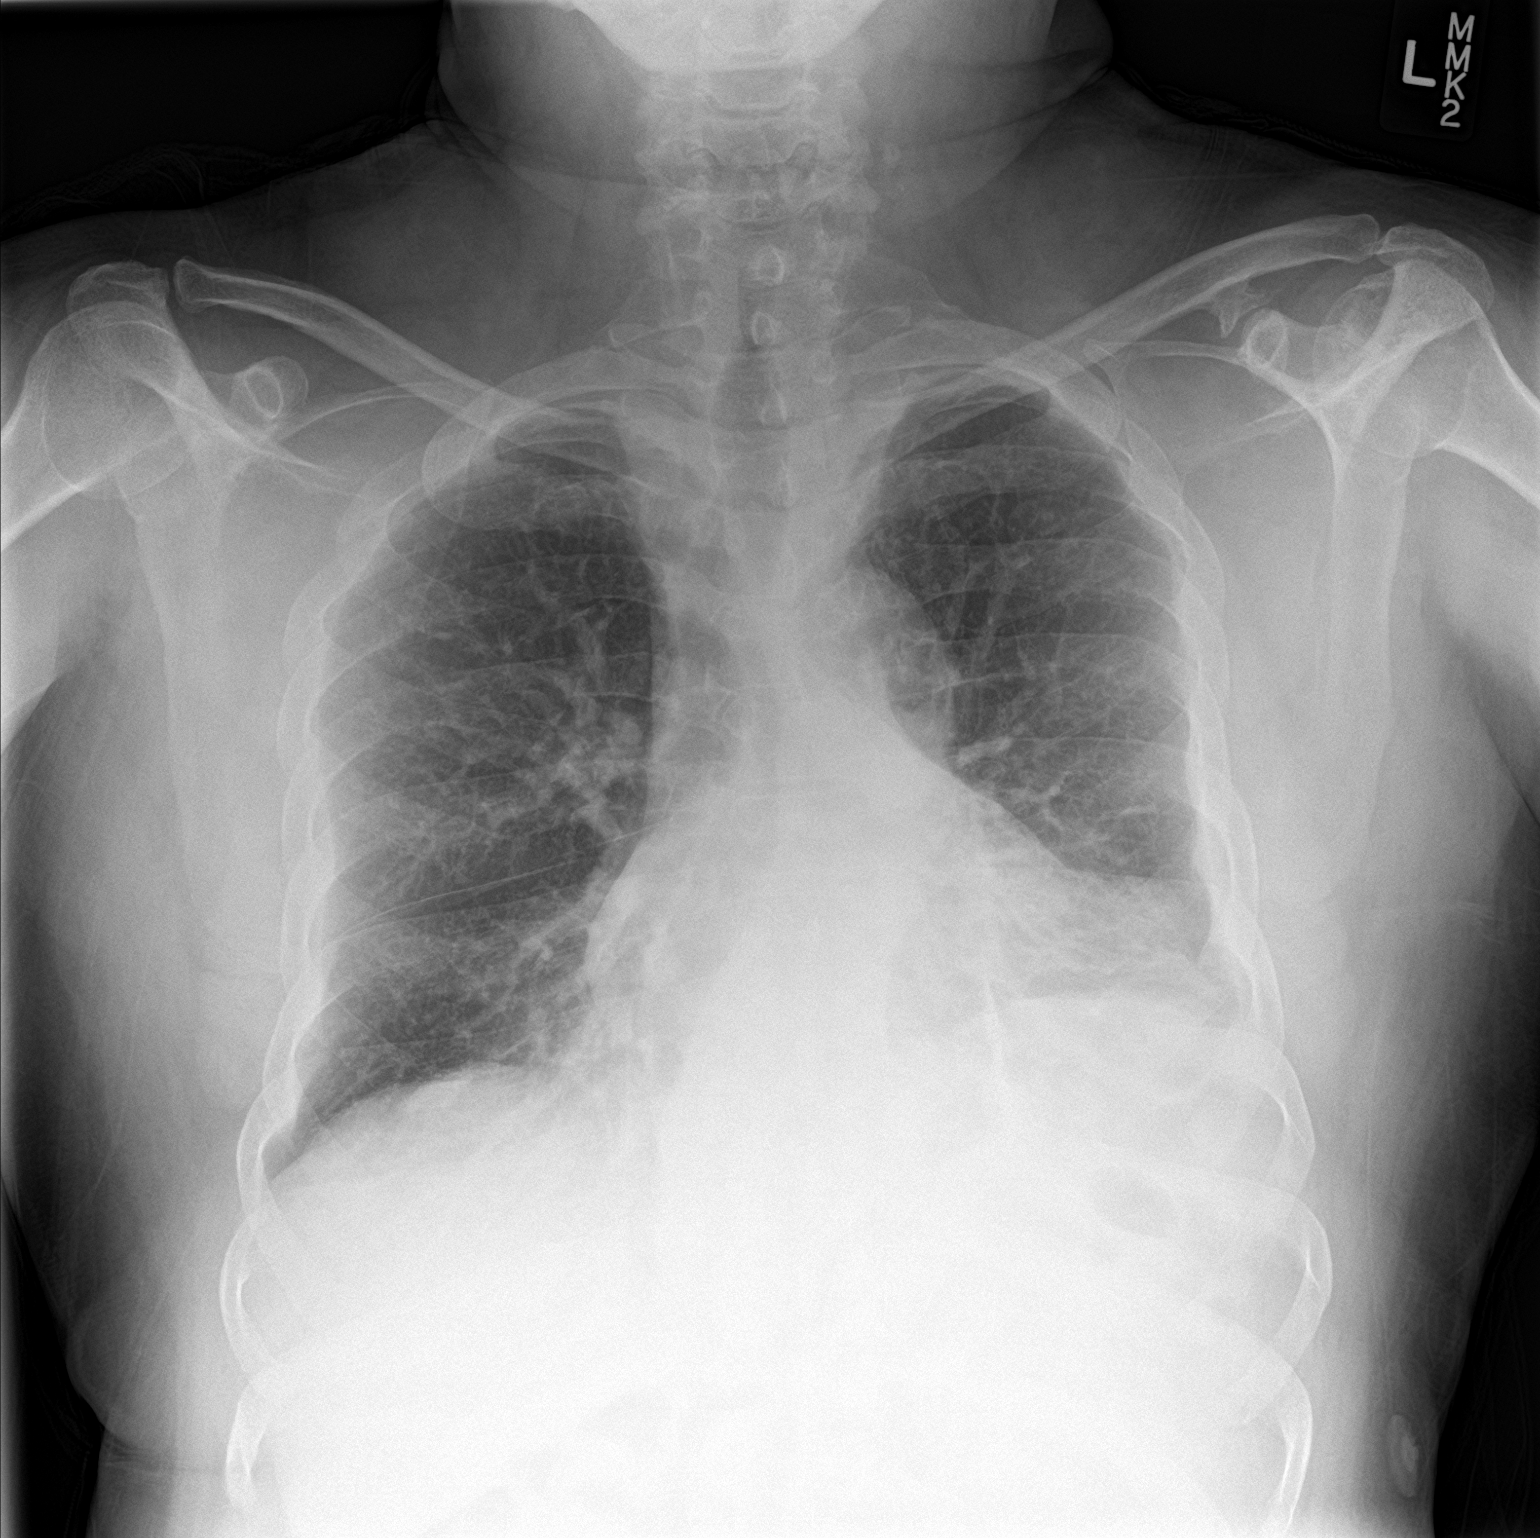

[chest lat]
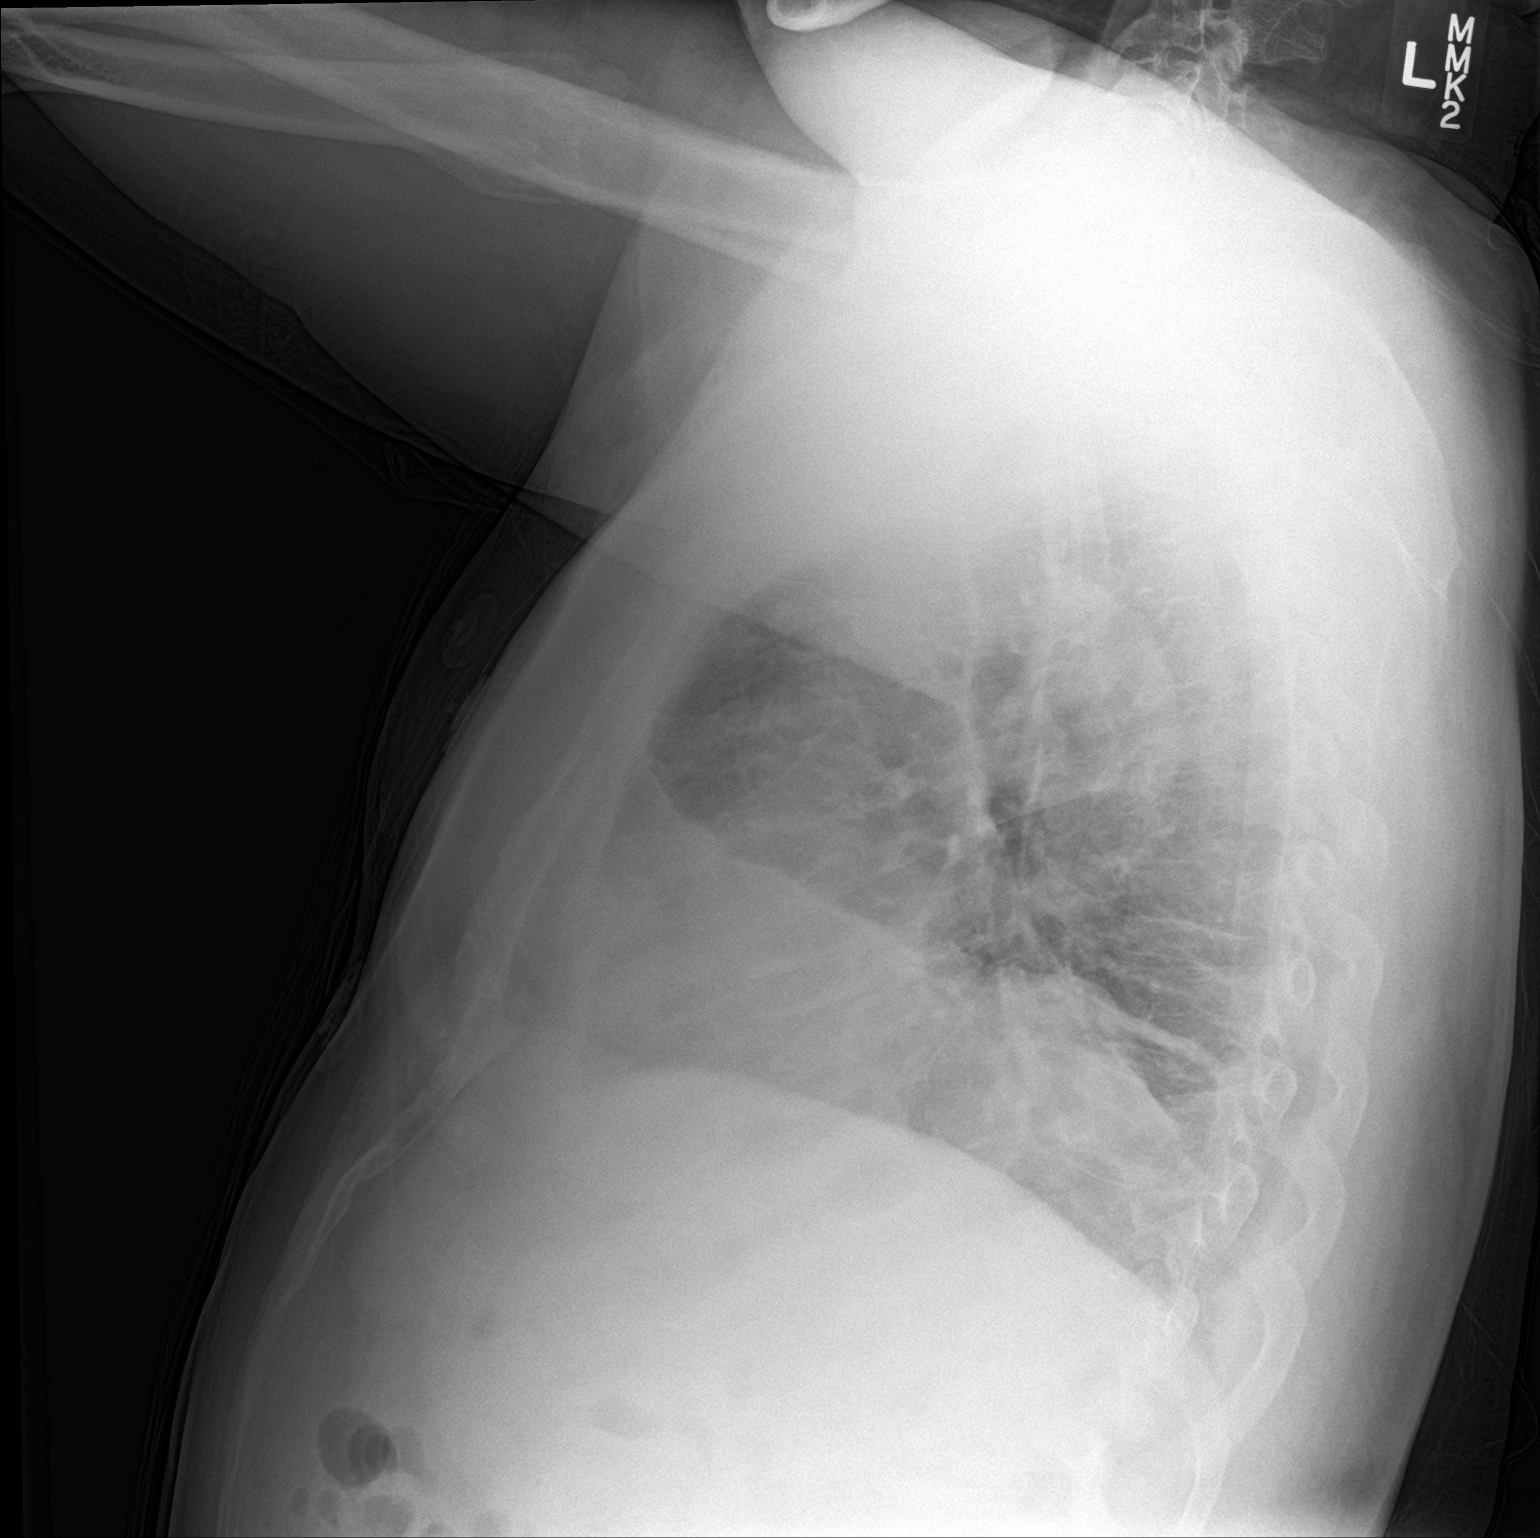

[2 of 2 positions shown; findings below may reference images not displayed]

FINDINGS: Heart size and pulmonary vascularity are within normal limits.
Tortuosity of the thoracic aorta. Chronic elevation of the left
hemidiaphragm. No acute infiltrates or effusions. No acute bone
abnormality.
IMPRESSION: No acute abnormalities. Chronic elevation of the left hemidiaphragm
with scarring at the left lung base.

## 2018-04-25 ENCOUNTER — Encounter (HOSPITAL_COMMUNITY): Payer: Self-pay

## 2018-04-25 ENCOUNTER — Encounter (HOSPITAL_COMMUNITY)
Admission: RE | Admit: 2018-04-25 | Discharge: 2018-04-25 | Disposition: A | Discharge status: Home / Self Care | Payer: Medicare Other | Source: Ambulatory Visit | Attending: Emergency Medicine | Admitting: Emergency Medicine

## 2018-04-25 VITALS — BP 132/72 | HR 76 | Temp 97.9°F | Resp 18 | Ht 63.0 in | Wt 190.3 lb

## 2018-04-25 DIAGNOSIS — J439 Emphysema, unspecified: Secondary | ICD-10-CM | POA: Insufficient documentation
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
## 2018-04-28 ENCOUNTER — Ambulatory Visit (INDEPENDENT_AMBULATORY_CARE_PROVIDER_SITE_OTHER): Payer: Medicare Other | Admitting: Acute Care

## 2018-04-28 ENCOUNTER — Encounter: Payer: Self-pay | Admitting: Acute Care

## 2018-04-28 DIAGNOSIS — I2510 Atherosclerotic heart disease of native coronary artery without angina pectoris: Secondary | ICD-10-CM | POA: Diagnosis not present

## 2018-04-28 DIAGNOSIS — G4733 Obstructive sleep apnea (adult) (pediatric): Secondary | ICD-10-CM | POA: Diagnosis not present

## 2018-04-28 DIAGNOSIS — J439 Emphysema, unspecified: Secondary | ICD-10-CM

## 2018-04-28 MED ORDER — FLUTICASONE-UMECLIDIN-VILANT 100-62.5-25 MCG/INH IN AEPB: 1.0000 | INHALATION_SPRAY | Freq: Every day | RESPIRATORY_TRACT | 3 refills | Status: DC

## 2018-04-28 MED ORDER — PREDNISONE 10 MG PO TABS: ORAL_TABLET | ORAL | 0 refills | Status: DC
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
## 2018-04-28 NOTE — Patient Instructions (Addendum)
It is nice to meet you today. We will send in  A prescription for Trelegy. Use one puff once daily. Rinse your mouth after use. Resume you Zyrtec once daily for post nasal drip. It is ok to use Generic, as it is cheaper. Continue using Flonase 2 sprays in each nostril once daily. This should also help with your post nasal drip which is making you cough. Prednisone taper; 10 mg tablets: 3 tabs x 2 days, 2 tabs x 2 days, 1 tabs x 2 days then stop. Continue using Delsym cough syrup every 12 hours. CPAP Titration scheduled 05/08/2018. We will call you with the results, and then we will get you machine ordered. Follow up with Dr. Lamonte Sakai or NP in 3 months. Follow up 30-90 days after starting CPAP. Please contact office for sooner follow up if symptoms do not improve or worsen or seek emergency care

## 2018-04-28 NOTE — Assessment & Plan Note (Signed)
Better on Trelegy Still coughing Not taking Zyrtec Plan We will send in  A prescription for Trelegy. Use one puff once daily. Rinse your mouth after use. Resume you Zyrtec once daily for post nasal drip. It is ok to use Generic, as it is cheaper. Continue using Flonase 2 sprays in each nostril once daily. This should also help with your post nasal drip which is making you cough. Prednisone taper; 10 mg tablets: 3 tabs x 2 days, 2 tabs x 2 days, 1 tabs x 2 days then stop. Continue using Delsym cough syrup every 12 hours. CPAP Titration scheduled 05/08/2018. We will call you with the results, and then we will get you machine ordered. Follow up with Dr. Lamonte Sakai or NP in 3 months. Follow up 30-90 days after starting CPAP. Please contact office for sooner follow up if symptoms do not improve or worsen or seek emergency care

## 2018-04-28 NOTE — Assessment & Plan Note (Signed)
CPAP titration is scheduled for 04/2018 Plan: We will send in  A prescription for Trelegy. Use one puff once daily. Rinse your mouth after use. Resume you Zyrtec once daily for post nasal drip. It is ok to use Generic, as it is cheaper. Continue using Flonase 2 sprays in each nostril once daily. This should also help with your post nasal drip which is making you cough. Prednisone taper; 10 mg tablets: 3 tabs x 2 days, 2 tabs x 2 days, 1 tabs x 2 days then stop. Continue using Delsym cough syrup every 12 hours. CPAP Titration scheduled 05/08/2018. We will call you with the results, and then we will get you machine ordered. Follow up with Dr. Lamonte Sakai or NP in 3 months. Once you have your  CPAP use at bedtime.  Goal is to wear for at least 6 hours each night for maximal clinical benefit. Continue to work on weight loss, as the link between excess weight  and sleep apnea is well established.  Do not drive if sleepy. Remember to clean mask, tubing, filter, and reservoir once weekly with soapy water.  Follow up 30-90 days after starting CPAP. Please contact office for sooner follow up if symptoms do not improve or worsen or seek emergency care

## 2018-04-29 ENCOUNTER — Encounter (HOSPITAL_COMMUNITY)
Admission: RE | Admit: 2018-04-29 | Discharge: 2018-04-29 | Disposition: A | Discharge status: Home / Self Care | Payer: Medicare Other | Source: Ambulatory Visit | Attending: Emergency Medicine | Admitting: Emergency Medicine

## 2018-04-29 DIAGNOSIS — J439 Emphysema, unspecified: Secondary | ICD-10-CM | POA: Insufficient documentation

## 2018-04-30 ENCOUNTER — Other Ambulatory Visit: Payer: Self-pay

## 2018-04-30 MED ORDER — LEVOTHYROXINE SODIUM 125 MCG PO TABS: 125.0000 ug | ORAL_TABLET | Freq: Every day | ORAL | 2 refills | Status: DC

## 2018-05-01 NOTE — Progress Notes (Signed)
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 with the consumption of 4-5 meals/snacks    Improve shortness of breath with ADL's  Yes    Intervention  Provide education, individualized exercise plan and daily activity instruction to help decrease symptoms of SOB with activities of daily living.    Expected Outcomes  Short Term: Improve cardiorespiratory fitness to achieve a reduction of symptoms when performing ADLs;Long Term: Be able to perform more ADLs without symptoms or delay the onset of symptoms    Diabetes  -- diagnosised with "pre diabetes" takes metofrmin but does not check his blood glucoose or have a meter    Lipids  Yes    Intervention  Provide education and support for participant on nutrition & aerobic/resistive exercise along with prescribed medications to achieve LDL 70mg , HDL >40mg .    Expected Outcomes  Short Term: Participant states understanding of  desired cholesterol values and is compliant with medications prescribed. Participant is following exercise prescription and nutrition guidelines.;Long Term: Cholesterol controlled with medications as prescribed, with individualized exercise RX and with personalized nutrition plan. Value goals: LDL < 70mg , HDL > 40 mg.       Core Components/Risk Factors/Patient Goals Review:    Core Components/Risk Factors/Patient Goals at Discharge (Final Review):    ITP Comments: ITP Comments    Row Name 04/25/18 1009           ITP Comments  Dr. Jennet Maduro, Medical Director          Comments:

## 2018-05-06 ENCOUNTER — Encounter (HOSPITAL_COMMUNITY)
Admission: RE | Admit: 2018-05-06 | Discharge: 2018-05-06 | Disposition: A | Discharge status: Home / Self Care | Payer: Medicare Other | Source: Ambulatory Visit | Attending: Emergency Medicine | Admitting: Emergency Medicine

## 2018-05-06 VITALS — Wt 191.1 lb

## 2018-05-06 DIAGNOSIS — J439 Emphysema, unspecified: Secondary | ICD-10-CM | POA: Diagnosis not present

## 2018-05-06 LAB — GLUCOSE, CAPILLARY
Glucose-Capillary: 114 mg/dL — ABNORMAL HIGH (ref 65–99)
Glucose-Capillary: 92 mg/dL (ref 65–99)

## 2018-05-06 NOTE — Progress Notes (Signed)
Daily Session Note  Patient Details  Name: George Robbins MRN: [id number] Date of Birth: July 07, 1951 Referring Provider:     Pulmonary Rehab Walk Test from 04/29/2018 in [place]  Referring Provider  Dr. Lamonte Sakai      Encounter Date: 05/06/2018  Check In: Session Check In - 05/06/18 1030      Check-In   Location  [facility]    Staff Present  Rosebud Poles, RN, BSN;Molly DiVincenzo, MS, ACSM RCEP, Exercise Physiologist;Lisa Ysidro Evert, RN;Carlette Powhatan, RN, Deland Pretty, MS, ACSM CEP, Exercise Physiologist;Annedrea Rosezella [place], RN, [facility]    Supervising physician immediately available to respond to emergencies  [facility] immediately available    Physician(s)  Dr. Tana Coast    Medication changes reported      No    Fall or balance concerns reported     No    Warm-up and Cool-down  Performed as group-led instruction    Resistance Training Performed  Yes    VAD Patient?  No      Pain Assessment   Currently in Pain?  No/denies    Multiple Pain Sites  No       Capillary Blood Glucose: Results for orders placed or performed during the hospital encounter of 05/06/18 (from the past 24 hour(s))  Glucose, capillary     Status: Abnormal   Collection Time: 05/06/18 10:41 AM  Result Value Ref Range   Glucose-Capillary 114 (H) 65 - 99 mg/dL  Glucose, capillary     Status: None   Collection Time: 05/06/18 12:02 PM  Result Value Ref Range   Glucose-Capillary 92 65 - 99 mg/dL   POCT Glucose - 05/06/18 1322      POCT Blood Glucose   Pre-Exercise  114 mg/dL    Post-Exercise  90 mg/dL      Exercise Prescription Changes - 05/06/18 1300      Response to Exercise   Blood Pressure (Admit)  124/74    Blood Pressure (Exercise)  118/80    Blood Pressure (Exit)  104/74    Heart Rate (Admit)  71 bpm    Heart Rate (Exercise)  91 bpm    Heart Rate (Exit)  66 bpm    Oxygen Saturation (Admit)  96 %    Oxygen Saturation (Exercise)  95 %     Oxygen Saturation (Exit)  19 %    Rating of Perceived Exertion (Exercise)  3    Duration  Progress to 45 minutes of aerobic exercise without signs/symptoms of physical distress    Intensity  THRR unchanged      Progression   Progression  Continue to progress workloads to maintain intensity without signs/symptoms of physical distress.      Resistance Training   Training Prescription  Yes    Weight  blue bands    Reps  10-15      Oxygen   Oxygen  Continuous    Liters  2      Bike   Level  0.4    Minutes  17      NuStep   Level  1    SPM  80    Minutes  17    METs  2.2      Track   Laps  8    Minutes  17       Social History   Tobacco Use  Smoking Status Former Smoker  . Packs/day: 1.00  . Years: 30.00  . Pack years:  30.00  . Types: Cigarettes  . Last attempt to quit: 04/02/2004  . Years since quitting: 14.1  Smokeless Tobacco Never Used    Goals Met:  Exercise tolerated well Strength training completed today  Goals Unmet:  Not Applicable  Comments: Service time is from 1030 to 1225.    Dr. Rush Farmer is Medical Director for Pulmonary Rehab at [facility].

## 2018-05-07 ENCOUNTER — Other Ambulatory Visit: Payer: Self-pay

## 2018-05-07 DIAGNOSIS — I2510 Atherosclerotic heart disease of native coronary artery without angina pectoris: Secondary | ICD-10-CM

## 2018-05-07 MED ORDER — ATORVASTATIN CALCIUM 40 MG PO TABS: 40.0000 mg | ORAL_TABLET | Freq: Every day | ORAL | 0 refills | Status: DC

## 2018-05-08 ENCOUNTER — Other Ambulatory Visit: Payer: Self-pay

## 2018-05-08 ENCOUNTER — Encounter (HOSPITAL_COMMUNITY)
Admission: RE | Admit: 2018-05-08 | Discharge: 2018-05-08 | Disposition: A | Discharge status: Home / Self Care | Payer: Medicare Other | Source: Ambulatory Visit | Attending: Emergency Medicine | Admitting: Emergency Medicine

## 2018-05-08 ENCOUNTER — Ambulatory Visit (HOSPITAL_BASED_OUTPATIENT_CLINIC_OR_DEPARTMENT_OTHER): Payer: Medicare Other | Attending: Acute Care | Admitting: Pulmonary Disease

## 2018-05-08 ENCOUNTER — Encounter

## 2018-05-08 DIAGNOSIS — G4733 Obstructive sleep apnea (adult) (pediatric): Secondary | ICD-10-CM | POA: Diagnosis not present

## 2018-05-08 DIAGNOSIS — J439 Emphysema, unspecified: Secondary | ICD-10-CM

## 2018-05-08 LAB — GLUCOSE, CAPILLARY
Glucose-Capillary: 101 mg/dL — ABNORMAL HIGH (ref 65–99)
Glucose-Capillary: 96 mg/dL (ref 65–99)

## 2018-05-08 MED ORDER — FLUTICASONE PROPIONATE 50 MCG/ACT NA SUSP: NASAL | 0 refills | Status: DC

## 2018-05-08 NOTE — Progress Notes (Signed)
Daily Session Note  Patient Details  Name: George Robbins MRN: [id number] Date of Birth: 02-Mar-1951 Referring Provider:     Pulmonary Rehab Walk Test from 04/29/2018 in [place]  Referring Provider  Dr. Lamonte Sakai      Encounter Date: 05/08/2018  Check In: Session Check In - 05/08/18 1030      Check-In   Location  [facility]    Staff Present  Rosebud Poles, RN, BSN;Molly DiVincenzo, MS, ACSM RCEP, Exercise Physiologist;Lisa Ysidro Evert, RN;Carlette Wilber Oliphant, RN, BSN;Ramon Dredge, RN, [facility]    Supervising physician immediately available to respond to emergencies  [facility] immediately available    Physician(s)  Dr. Broadus John    Medication changes reported      No    Fall or balance concerns reported     No    Tobacco Cessation  No Change    Warm-up and Cool-down  Performed as group-led instruction    Resistance Training Performed  Yes    VAD Patient?  No      Pain Assessment   Currently in Pain?  No/denies    Multiple Pain Sites  No       Capillary Blood Glucose: Results for orders placed or performed during the hospital encounter of 05/08/18 (from the past 24 hour(s))  Glucose, capillary     Status: Abnormal   Collection Time: 05/08/18 10:17 AM  Result Value Ref Range   Glucose-Capillary 101 (H) 65 - 99 mg/dL  Glucose, capillary     Status: None   Collection Time: 05/08/18 12:05 PM  Result Value Ref Range   Glucose-Capillary 96 65 - 99 mg/dL      Social History   Tobacco Use  Smoking Status Former Smoker  . Packs/day: 1.00  . Years: 30.00  . Pack years: 30.00  . Types: Cigarettes  . Last attempt to quit: 04/02/2004  . Years since quitting: 14.1  Smokeless Tobacco Never Used    Goals Met:  Exercise tolerated well Strength training completed today  Goals Unmet:  Not Applicable  Comments: Service time is from 1030 to 1215.    Dr. Rush Farmer is Medical Director for Pulmonary Rehab at [facility].

## 2018-05-09 ENCOUNTER — Other Ambulatory Visit: Payer: Self-pay | Admitting: Internal Medicine

## 2018-05-09 ENCOUNTER — Telehealth: Payer: Self-pay | Admitting: Pulmonary Disease

## 2018-05-09 DIAGNOSIS — N401 Enlarged prostate with lower urinary tract symptoms: Secondary | ICD-10-CM

## 2018-05-09 DIAGNOSIS — G4730 Sleep apnea, unspecified: Secondary | ICD-10-CM | POA: Diagnosis not present

## 2018-05-09 DIAGNOSIS — R350 Frequency of micturition: Principal | ICD-10-CM

## 2018-05-09 DIAGNOSIS — G4733 Obstructive sleep apnea (adult) (pediatric): Secondary | ICD-10-CM

## 2018-05-09 NOTE — Procedures (Signed)
Patient Name: George Robbins, George Robbins Date: 05/08/2018 Gender: Male D.O.B: [birth year] Age (years): 9 Referring Provider: Magdalen Spatz NP Height (inches): 63 Interpreting Physician: Kara Mead MD, ABSM Weight (lbs): 190 RPSGT: Baxter Flattery BMI: 34 MRN: [id number] Neck Size: 20.00 <br> <br> CLINICAL INFORMATION The patient is referred for a CPAP titration to treat sleep apnea.  Date of  HST: 02/2018 , AHI 15/h, severe desaturations without events  SLEEP STUDY TECHNIQUE As per the AASM Manual for the Scoring of Sleep and Associated Events v2.3 (April 2016) with a hypopnea requiring 4% desaturations.  The channels recorded and monitored were frontal, central and occipital EEG, electrooculogram (EOG), submentalis EMG (chin), nasal and oral airflow, thoracic and abdominal wall motion, anterior tibialis EMG, snore microphone, electrocardiogram, and pulse oximetry. Continuous positive airway pressure (CPAP) was initiated at the beginning of the study and titrated to treat sleep-disordered breathing.  TECHNICIAN COMMENTS Comments added by technician: Patient had difficulty initiating sleep.  RESPIRATORY PARAMETERS Optimal PAP Pressure (cm): 13 AHI at Optimal Pressure (/hr): 10.0 Overall Minimal O2 (%): 73.0 Supine % at Optimal Pressure (%): 100 Minimal O2 at Optimal Pressure (%): 84.0   SLEEP ARCHITECTURE The study was initiated at 11:01:23 PM and ended at 5:10:19 AM.  Sleep onset time was 4.2 minutes and the sleep efficiency was 86.3%%. The total sleep time was 318.5 minutes.  The patient spent 3.9%% of the night in stage N1 sleep, 74.9%% in stage N2 sleep, 0.0%% in stage N3 and 21.19% in REM.Stage REM latency was 68.0 minutes  Wake after sleep onset was 46.2. Alpha intrusion was absent. Supine sleep was 100.00%.  CARDIAC DATA The 2 lead EKG demonstrated sinus rhythm. The mean heart rate was 63.9 beats per minute. Other EKG findings include: None. LEG MOVEMENT DATA The total  Periodic Limb Movements of Sleep (PLMS) were 0. The PLMS index was 0.0. A PLMS index of <15 is considered normal in adults.  IMPRESSIONS - The optimal PAP pressure was 13 cm of water. - Central sleep apnea was not noted during this titration (CAI = 0.0/h). - Severe oxygen desaturations were observed during this titration (min O2 = 73.0%). - No snoring was audible during this study. - No cardiac abnormalities were observed during this study. - Clinically significant periodic limb movements were not noted during this study. Arousals associated with PLMs were rare.   DIAGNOSIS - Obstructive Sleep Apnea (327.23 [G47.33 ICD-10])   RECOMMENDATIONS - Trial of CPAP therapy on 13 cm H2O with a Medium size Fisher&Paykel Full Face Mask Simplus mask and heated humidification. - Avoid alcohol, sedatives and other CNS depressants that may worsen sleep apnea and disrupt normal sleep architecture. - Sleep hygiene should be reviewed to assess factors that may improve sleep quality. - Weight management and regular exercise should be initiated or continued. - Return to [facility] Center for re-evaluation after 4 weeks of therapy , Consider oxygen saturation check at final CPAP level once compliant for desaturations due to underlying lung disease   Kara Mead MD Board Certified in [place]

## 2018-05-09 NOTE — Telephone Encounter (Signed)
Pt is aware of results and voiced his understanding. Order has been placed for cpap therapy. Pt has pending OV for 06/30/18 at 10:00a. Nothing further is needed.

## 2018-05-09 NOTE — Telephone Encounter (Signed)
CPAP therapy on 13 cm H2O with a Medium size Fisher&Paykel Full Face Mask Simplus mask and heated humidification.  OV with me in 6 wks

## 2018-05-13 ENCOUNTER — Telehealth: Payer: Self-pay | Admitting: Emergency Medicine

## 2018-05-13 ENCOUNTER — Telehealth (HOSPITAL_COMMUNITY): Payer: Self-pay | Admitting: *Deleted

## 2018-05-13 ENCOUNTER — Encounter (HOSPITAL_COMMUNITY)
Admission: RE | Admit: 2018-05-13 | Discharge: 2018-05-13 | Disposition: A | Discharge status: Home / Self Care | Payer: Medicare Other | Source: Ambulatory Visit | Attending: Emergency Medicine | Admitting: Emergency Medicine

## 2018-05-13 VITALS — Wt 191.1 lb

## 2018-05-13 DIAGNOSIS — J439 Emphysema, unspecified: Secondary | ICD-10-CM | POA: Diagnosis not present

## 2018-05-13 LAB — GLUCOSE, CAPILLARY
GLUCOSE-CAPILLARY: 110 mg/dL — AB (ref 65–99)
Glucose-Capillary: 105 mg/dL — ABNORMAL HIGH (ref 65–99)

## 2018-05-13 NOTE — Telephone Encounter (Signed)
Called and spoke with Remo Lipps, she states that while the patient is at rehab he is placed on 2 liters of 02 to help keep his oxygen up while exercising. She is asking that the patient is on 2 liters of o2 while at home as well. She states that patient will be doing exercising while at home.    RB please advise, thank you.

## 2018-05-13 NOTE — Progress Notes (Signed)
Daily Session Note  Patient Details  Name: George Robbins MRN: [id number] Date of Birth: Aug 13, 1951 Referring Provider:     Pulmonary Rehab Walk Test from 04/29/2018 in [place]  Referring Provider  Dr. Lamonte Sakai      Encounter Date: 05/13/2018  Check In: Session Check In - 05/13/18 1030      Check-In   Location  [facility]    Staff Present  Rosebud Poles, RN, BSN;Molly DiVincenzo, MS, ACSM RCEP, Exercise Physiologist;Lisa Ysidro Evert, Felipe Drone, RN, MHA;Carlette Wilber Oliphant, RN, BSN    Supervising physician immediately available to respond to emergencies  [facility] immediately available    Physician(s)  Dr. Broadus John    Medication changes reported      No    Fall or balance concerns reported     No    Tobacco Cessation  No Change    Warm-up and Cool-down  Performed as group-led instruction    Resistance Training Performed  Yes    VAD Patient?  No      Pain Assessment   Currently in Pain?  No/denies    Multiple Pain Sites  No       Capillary Blood Glucose: Results for orders placed or performed during the hospital encounter of 05/13/18 (from the past 24 hour(s))  Glucose, capillary     Status: Abnormal   Collection Time: 05/13/18  9:53 AM  Result Value Ref Range   Glucose-Capillary 105 (H) 65 - 99 mg/dL  Glucose, capillary     Status: Abnormal   Collection Time: 05/13/18 12:12 PM  Result Value Ref Range   Glucose-Capillary 110 (H) 65 - 99 mg/dL      Social History   Tobacco Use  Smoking Status Former Smoker  . Packs/day: 1.00  . Years: 30.00  . Pack years: 30.00  . Types: Cigarettes  . Last attempt to quit: 04/02/2004  . Years since quitting: 14.1  Smokeless Tobacco Never Used    Goals Met:  Exercise tolerated well Strength training completed today  Goals Unmet:  Not Applicable  Comments: Service time is from 1030 to 1210    Dr. Rush Farmer is Medical Director for Pulmonary Rehab at  [facility].

## 2018-05-14 NOTE — Telephone Encounter (Signed)
Called patient, unable to reach. Left message to give [place] a call back. I have ordered o2 for exertion.

## 2018-05-14 NOTE — Telephone Encounter (Signed)
Please order oxygen 2 L/min with exertion.  If the patient desires then he can be assessed for pulse oxygen and a portable oxygen concentrator.
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
## 2018-05-15 ENCOUNTER — Encounter (HOSPITAL_COMMUNITY)
Admission: RE | Admit: 2018-05-15 | Discharge: 2018-05-15 | Disposition: A | Discharge status: Home / Self Care | Payer: Medicare Other | Source: Ambulatory Visit | Attending: Emergency Medicine | Admitting: Emergency Medicine

## 2018-05-15 ENCOUNTER — Telehealth: Payer: Self-pay | Admitting: Emergency Medicine

## 2018-05-15 VITALS — Wt 189.8 lb

## 2018-05-15 DIAGNOSIS — J439 Emphysema, unspecified: Secondary | ICD-10-CM | POA: Diagnosis not present

## 2018-05-15 NOTE — Progress Notes (Signed)
Daily Session Note  Patient Details  Name: George Robbins MRN: [id number] Date of Birth: [birth year] Referring Provider:     Pulmonary Rehab Walk Test from 04/29/2018 in [place]  Referring Provider  Dr. Lamonte Sakai      Encounter Date: 05/15/2018  Check In: Session Check In - 05/15/18 1030      Check-In   Location  [facility]    Staff Present  Rosebud Poles, RN, BSN;Molly DiVincenzo, MS, ACSM RCEP, Exercise Physiologist;Lisa Ysidro Evert, Felipe Drone, RN, [facility]    Supervising physician immediately available to respond to emergencies  [facility] immediately available    Physician(s)  Dr. Herbert Moors    Medication changes reported      No    Fall or balance concerns reported     No    Tobacco Cessation  No Change    Warm-up and Cool-down  Performed as group-led instruction    Resistance Training Performed  Yes    VAD Patient?  No      Pain Assessment   Currently in Pain?  No/denies    Multiple Pain Sites  No       Capillary Blood Glucose: No results found for this or any previous visit (from the past 24 hour(s)).    Social History   Tobacco Use  Smoking Status Former Smoker  . Packs/day: 1.00  . Years: 30.00  . Pack years: 30.00  . Types: Cigarettes  . Last attempt to quit: 04/02/2004  . Years since quitting: 14.1  Smokeless Tobacco Never Used    Goals Met:  Exercise tolerated well Strength training completed today  Goals Unmet:  Not Applicable  Comments: Service time is from 1030 to 1220    Dr. Rush Farmer is Medical Director for Pulmonary Rehab at [facility].

## 2018-05-15 NOTE — Telephone Encounter (Signed)
Order has been placed stating pt needs to have 2L continuous 24hr.  Called and spoke with Caryl Pina from [facility] to let him know this had been done. Per Caryl Pina, he needed the order to be on the Template.  Replaced the order with the needed information.  Nothing further needed.

## 2018-05-18 ENCOUNTER — Telehealth: Payer: Self-pay | Admitting: Internal Medicine

## 2018-05-18 DIAGNOSIS — G4733 Obstructive sleep apnea (adult) (pediatric): Secondary | ICD-10-CM

## 2018-05-19 ENCOUNTER — Telehealth: Payer: Self-pay | Admitting: Emergency Medicine

## 2018-05-19 NOTE — Telephone Encounter (Signed)
Pt would like [place] to fax rx. [facility]

## 2018-05-19 NOTE — Telephone Encounter (Signed)
Called and spoke with Pietro Cassis.  She stated that he needed new OV, for [facility] to have note stating he has finished prednisone taper and have qualifying walk for oxygen.   Called and spoke with Patient.  He stated that he wanted to see NP for OV.  Appointment made 05/23/18 at 2:30pm with Wyn Quaker, NP.

## 2018-05-20 ENCOUNTER — Ambulatory Visit (HOSPITAL_COMMUNITY): Payer: Medicare Other

## 2018-05-20 ENCOUNTER — Encounter (HOSPITAL_COMMUNITY)
Admission: RE | Admit: 2018-05-20 | Discharge: 2018-05-20 | Disposition: A | Discharge status: Home / Self Care | Payer: Medicare Other | Source: Ambulatory Visit | Attending: Emergency Medicine | Admitting: Emergency Medicine

## 2018-05-20 VITALS — Wt 191.1 lb

## 2018-05-20 DIAGNOSIS — J439 Emphysema, unspecified: Secondary | ICD-10-CM | POA: Diagnosis not present

## 2018-05-20 LAB — GLUCOSE, CAPILLARY: GLUCOSE-CAPILLARY: 99 mg/dL (ref 70–99)

## 2018-05-20 NOTE — Progress Notes (Signed)
Daily Session Note  Patient Details  Name: George Robbins MRN: [id number] Date of Birth: 16-Dec-1950 Referring Provider:     Pulmonary Rehab Walk Test from 04/29/2018 in [place]  Referring Provider  Dr. Lamonte Sakai      Encounter Date: 05/20/2018  Check In: Session Check In - 05/20/18 1030      Check-In   Location  [facility]    Staff Present  Rosebud Poles, RN, BSN;Molly DiVincenzo, MS, ACSM RCEP, Exercise Physiologist;Lisa Ysidro Evert, Felipe Drone, RN, [facility]    Supervising physician immediately available to respond to emergencies  [facility] immediately available    Physician(s)  Dr. Herbert Moors    Medication changes reported      No    Fall or balance concerns reported     No    Tobacco Cessation  No Change    Warm-up and Cool-down  Performed as group-led instruction    Resistance Training Performed  Yes    VAD Patient?  No    PAD/SET Patient?  No      Pain Assessment   Currently in Pain?  No/denies    Multiple Pain Sites  No       Capillary Blood Glucose: Results for orders placed or performed during the hospital encounter of 05/20/18 (from the past 24 hour(s))  Glucose, capillary     Status: None   Collection Time: 05/20/18 10:44 AM  Result Value Ref Range   Glucose-Capillary 99 70 - 99 mg/dL    Exercise Prescription Changes - 05/20/18 1200      Response to Exercise   Blood Pressure (Admit)  118/64    Blood Pressure (Exercise)  100/64    Blood Pressure (Exit)  112/64    Heart Rate (Admit)  84 bpm    Heart Rate (Exercise)  105 bpm    Heart Rate (Exit)  86 bpm    Oxygen Saturation (Admit)  97 %    Oxygen Saturation (Exercise)  95 %    Oxygen Saturation (Exit)  95 %    Rating of Perceived Exertion (Exercise)  13    Perceived Dyspnea (Exercise)  2    Duration  Progress to 45 minutes of aerobic exercise without signs/symptoms of physical distress    Intensity  THRR unchanged      Progression   Progression  Continue to progress workloads to maintain intensity without signs/symptoms of physical distress.      Resistance Training   Training Prescription  Yes    Weight  blue bands    Reps  10-15    Time  10 Minutes      Interval Training   Interval Training  No      Oxygen   Oxygen  Continuous    Liters  2      Bike   Level  0.4    Minutes  17      NuStep   Level  3    SPM  80    Minutes  17    METs  1.7      Track   Laps  12    Minutes  17       Social History   Tobacco Use  Smoking Status Former Smoker  . Packs/day: 1.00  . Years: 30.00  . Pack years: 30.00  . Types: Cigarettes  . Last attempt to quit: 04/02/2004  . Years since quitting: 14.1  Smokeless Tobacco Never Used    Goals  Met:  Exercise tolerated well Strength training completed today  Goals Unmet:  Not Applicable  Comments: Service time is from 1030 to 1220    Dr. Rush Farmer is Medical Director for Pulmonary Rehab at [facility].

## 2018-05-21 ENCOUNTER — Other Ambulatory Visit: Payer: Self-pay

## 2018-05-21 ENCOUNTER — Telehealth: Payer: Self-pay

## 2018-05-21 DIAGNOSIS — I2510 Atherosclerotic heart disease of native coronary artery without angina pectoris: Secondary | ICD-10-CM

## 2018-05-21 MED ORDER — CLOPIDOGREL BISULFATE 75 MG PO TABS: 75.0000 mg | ORAL_TABLET | Freq: Every day | ORAL | 6 refills | Status: DC

## 2018-05-21 NOTE — Telephone Encounter (Signed)
error 

## 2018-05-22 ENCOUNTER — Encounter (HOSPITAL_COMMUNITY)
Admission: RE | Admit: 2018-05-22 | Discharge: 2018-05-22 | Disposition: A | Discharge status: Home / Self Care | Payer: Medicare Other | Source: Ambulatory Visit | Attending: Emergency Medicine | Admitting: Emergency Medicine

## 2018-05-22 VITALS — Wt 192.0 lb

## 2018-05-22 DIAGNOSIS — J439 Emphysema, unspecified: Secondary | ICD-10-CM | POA: Diagnosis not present

## 2018-05-22 NOTE — Progress Notes (Signed)
Daily Session Note  Patient Details  Name: George Robbins MRN: [id number] Date of Birth: 03-21-1951 Referring Provider:     Pulmonary Rehab Walk Test from 04/29/2018 in [place]  Referring Provider  Dr. Lamonte Sakai      Encounter Date: 05/22/2018  Check In: Session Check In - 05/22/18 1115      Check-In   Location  [facility]    Staff Present  Rosebud Poles, RN, BSN;Molly DiVincenzo, MS, ACSM RCEP, Exercise Physiologist;Lisa Ysidro Evert, Felipe Drone, RN, [facility]    Supervising physician immediately available to respond to emergencies  [facility] immediately available    Physician(s)  Dr. Bonner Puna    Medication changes reported      No    Fall or balance concerns reported     No    Tobacco Cessation  No Change    Warm-up and Cool-down  Performed as group-led instruction    Resistance Training Performed  Yes    VAD Patient?  No    PAD/SET Patient?  No      Pain Assessment   Currently in Pain?  No/denies    Multiple Pain Sites  No       Capillary Blood Glucose: No results found for this or any previous visit (from the past 24 hour(s)).    Social History   Tobacco Use  Smoking Status Former Smoker  . Packs/day: 1.00  . Years: 30.00  . Pack years: 30.00  . Types: Cigarettes  . Last attempt to quit: 04/02/2004  . Years since quitting: 14.1  Smokeless Tobacco Never Used    Goals Met:  Exercise tolerated well Strength training completed today  Goals Unmet:  Not Applicable  Comments: Service time is from 1115 to 1330    Dr. Rush Farmer is Medical Director for Pulmonary Rehab at [facility].

## 2018-05-23 ENCOUNTER — Encounter: Payer: Self-pay | Admitting: Pulmonary Disease

## 2018-05-23 ENCOUNTER — Ambulatory Visit (INDEPENDENT_AMBULATORY_CARE_PROVIDER_SITE_OTHER): Payer: Medicare Other | Admitting: Pulmonary Disease

## 2018-05-23 VITALS — BP 118/78 | HR 87 | Ht 64.0 in | Wt 190.6 lb

## 2018-05-23 DIAGNOSIS — J9611 Chronic respiratory failure with hypoxia: Secondary | ICD-10-CM | POA: Diagnosis not present

## 2018-05-23 DIAGNOSIS — Z6835 Body mass index (BMI) 35.0-35.9, adult: Secondary | ICD-10-CM

## 2018-05-23 DIAGNOSIS — G4733 Obstructive sleep apnea (adult) (pediatric): Secondary | ICD-10-CM

## 2018-05-23 DIAGNOSIS — J439 Emphysema, unspecified: Secondary | ICD-10-CM

## 2018-05-23 MED ORDER — FLUTICASONE-UMECLIDIN-VILANT 100-62.5-25 MCG/INH IN AEPB: 1.0000 | INHALATION_SPRAY | Freq: Every day | RESPIRATORY_TRACT | 0 refills | Status: DC

## 2018-05-23 NOTE — Assessment & Plan Note (Signed)
Continue to work towards healthy weight Continue pulmonary rehab

## 2018-05-23 NOTE — Assessment & Plan Note (Signed)
Continue CPAP Follow-up in 6 weeks to discuss using

## 2018-05-23 NOTE — Patient Instructions (Addendum)
Will place order for oxygen today with [facility] 2 L continuous 2 L at night with CPAP Will place order for POC 3 L pulsed with exercise or exertion Continue pulmonary rehab Continue trelegy >>> Samples provided today in office Continue CPAP Follow-up in 6 weeks we can review CPAP use   . Keep up the hard work using your device.  . Do not drive or operate heavy machinery if tired or drowsy.  . Please notify the supply company and office if you are unable to use your device regularly due to missing supplies or machine being broken.  . Work on maintaining a healthy weight and following your recommended nutrition plan  . Maintain proper daily exercise and movement  . Maintaining proper use of your device can also help improve management of other chronic illnesses such as: Blood pressure, blood sugars, and weight management.   CPAP Cleaning:  Clean weekly, with Dawn soap, and bottle brush.  Set up to air dry.       Please contact the office if your symptoms worsen or you have concerns that you are not improving.   Thank you for choosing [facility] for your healthcare, and for allowing [place] to partner with you on your healthcare journey. I am thankful to be able to provide care to you today.   Wyn Quaker FNP-C

## 2018-05-23 NOTE — Assessment & Plan Note (Signed)
Will place order for oxygen today with [facility] 2 L continuous 2 L at night with CPAP Will place order for POC 3 L pulsed with exercise or exertion Continue pulmonary rehab Continue trelegy >>> Samples provided today in office Continue CPAP Follow-up in 6 weeks we can review CPAP use

## 2018-05-23 NOTE — Assessment & Plan Note (Signed)
Will place order for oxygen today with [facility] 2 L continuous 2 L at night with CPAP Will place order for POC 3 L pulsed with exercise or exertion Continue pulmonary rehab
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
## 2018-05-27 ENCOUNTER — Encounter (HOSPITAL_COMMUNITY)
Admission: RE | Admit: 2018-05-27 | Discharge: 2018-05-27 | Disposition: A | Discharge status: Home / Self Care | Payer: Medicare Other | Source: Ambulatory Visit | Attending: Emergency Medicine | Admitting: Emergency Medicine

## 2018-05-27 VITALS — Wt 190.7 lb

## 2018-05-27 DIAGNOSIS — J439 Emphysema, unspecified: Secondary | ICD-10-CM | POA: Insufficient documentation

## 2018-05-27 NOTE — Progress Notes (Signed)
Daily Session Note  Patient Details  Name: George Robbins MRN: [id number] Date of Birth: May 27, 1951 Referring Provider:     Pulmonary Rehab Walk Test from 04/29/2018 in [place]  Referring Provider  Dr. Lamonte Sakai      Encounter Date: 05/27/2018  Check In: Session Check In - 05/27/18 1030      Check-In   Location  [facility]    Staff Present  Rosebud Poles, RN, BSN;Carlette Carlton, RN, [facility], MS, ACSM RCEP, Exercise Physiologist;Lisa Ysidro Evert, Felipe Drone, RN, [facility]    Supervising physician immediately available to respond to emergencies  [facility] immediately available    Physician(s)  Dr. Bonner Puna    Medication changes reported      No    Fall or balance concerns reported     No    Tobacco Cessation  No Change    Warm-up and Cool-down  Performed as group-led instruction    VAD Patient?  No    PAD/SET Patient?  No      Pain Assessment   Currently in Pain?  No/denies    Multiple Pain Sites  No       Capillary Blood Glucose: No results found for this or any previous visit (from the past 24 hour(s)).  Exercise Prescription Changes - 05/27/18 1200      Home Exercise Plan   Plans to continue exercise at  Home (comment)    Frequency  Add 2 additional days to program exercise sessions.       Social History   Tobacco Use  Smoking Status Former Smoker  . Packs/day: 1.00  . Years: 30.00  . Pack years: 30.00  . Types: Cigarettes  . Last attempt to quit: 04/02/2004  . Years since quitting: 14.1  Smokeless Tobacco Never Used    Goals Met:  Exercise tolerated well Strength training completed today  Goals Unmet:  Not Applicable  Comments: Service time is from 1030 to 1215    Dr. Rush Farmer is Medical Director for Pulmonary Rehab at [facility].

## 2018-05-27 NOTE — Progress Notes (Signed)
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 Infections: - Discuss the signs and symptoms of respiratory infections, ways to prevent respiratory infections, and the importance of seeking medical treatment when having a respiratory infection.   Stress I: Signs and Symptoms: - Discuss the causes of stress, how stress may lead to anxiety and depression, and ways to limit stress.   Stress II: Relaxation: -Discuss relaxation techniques to limit stress.   Oxygen for Home/Travel: - Discuss how to prepare for travel when on oxygen and proper ways to transport and store oxygen to ensure safety.   Knowledge Questionnaire Score: Knowledge Questionnaire Score - 05/06/18 1044      Knowledge Questionnaire Score   Pre Score  12/18       Core  Components/Risk Factors/Patient Goals at Admission: Personal Goals and Risk Factors at Admission - 04/25/18 1028      Core Components/Risk Factors/Patient Goals on Admission    Weight Management  Weight Loss;Yes    Intervention  Weight Management/Obesity: Establish reasonable short term and long term weight goals.;Weight Management: Provide education and appropriate resources to help participant work on and attain dietary goals.;Obesity: Provide education and appropriate resources to help participant work on and attain dietary goals.;Weight Management: Develop a combined nutrition and exercise program designed to reach desired caloric intake, while maintaining appropriate intake of nutrient and fiber, sodium and fats, and appropriate energy expenditure required for the weight goal.    Admit Weight  196 lb 13.9 oz (89.3 kg)    Goal Weight: Short Term  185 lb (83.9 kg)    Goal Weight: Long Term  180 lb (81.6 kg)    Expected Outcomes  Short Term: Continue to assess and modify interventions until short term weight is achieved;Weight Maintenance: Understanding of the daily nutrition guidelines, which includes 25-35% calories from fat, 7% or less cal from saturated fats, less than 261m cholesterol, less than 1.5gm of sodium, & 5 or more servings of fruits and vegetables daily;Weight Loss: Understanding of general recommendations for a balanced deficit meal plan, which promotes 1-2 lb weight loss per week and includes a negative energy balance of [phone] kcal/d;Long Term: Adherence to nutrition and physical activity/exercise program aimed toward attainment of established weight goal;Understanding recommendations for meals to include 15-35% energy as protein, 25-35% energy from fat, 35-60% energy from carbohydrates, less than 2084mof dietary cholesterol, 20-35 gm of total fiber daily;Understanding of distribution of calorie intake throughout the day with the consumption of 4-5 meals/snacks    Improve shortness  of breath with ADL's  Yes    Intervention  Provide education, individualized exercise plan and daily activity instruction to help decrease symptoms of SOB with activities of daily living.    Expected Outcomes  Short Term: Improve cardiorespiratory fitness to achieve a reduction of symptoms when performing ADLs;Long Term: Be able to perform more ADLs without symptoms or delay the onset of symptoms    Diabetes  -- diagnosised with "pre diabetes" takes metofrmin but does not check his blood glucoose or have a meter    Lipids  Yes    Intervention  Provide education and support for participant on nutrition & aerobic/resistive exercise along with prescribed medications to achieve LDL <46m, HDL >472m    Expected Outcomes  Short Term: Participant states understanding of desired cholesterol values and is compliant with medications prescribed. Participant is following exercise prescription and nutrition guidelines.;Long Term: Cholesterol controlled with medications as prescribed, with individualized exercise RX and with personalized nutrition plan. Value goals: LDL < 7030mHDL > 40 mg.       Core Components/Risk Factors/Patient Goals Review:  Goals and Risk Factor Review    Row Name 05/26/18 1400             Core Components/Risk Factors/Patient Goals Review   Personal Goals Review  Weight Management/Obesity;Improve shortness of breath with ADL's;Heart Failure;Diabetes       Review  has attended 6 exercise sessions, no weight loss as of yet, heart failure and diabetes are controlled, CBG 90-114 while in program, we are not monitoring further b/c of readings at start of program.  walking 13 laps, level 3 on nustep, .4 level on bike       Expected Outcomes  see admission goals          Core Components/Risk Factors/Patient Goals at Discharge (Final Review):  Goals and Risk Factor Review - 05/26/18 1400      Core Components/Risk Factors/Patient Goals Review   Personal Goals Review  Weight  Management/Obesity;Improve shortness of breath with ADL's;Heart Failure;Diabetes    Review  has attended 6 exercise sessions, no weight loss as of yet, heart failure and diabetes are controlled, CBG 90-114 while in program, we are not monitoring further b/c of readings at start of program.  walking 13 laps, level 3 on nustep, .4 level on bike    Expected Outcomes  see admission goals       ITP Comments: ITP Comments    Row Name 04/25/18 1009           ITP Comments  Dr. WesJennet Maduroedical Director          Comments: ITP REVIEW Pt is making expected progress toward pulmonary rehab goals after completing 7 sessions. Recommend continued exercise, life style modification, education, and utilization of breathing techniques to increase stamina and strength and decrease shortness of breath with exertion.

## 2018-05-27 NOTE — Progress Notes (Signed)
I have reviewed a Home Exercise Prescription with George Robbins . George Robbins is not currently exercising at home.  The patient was advised to walk 2 days a week for 30 minutes.  George Robbins and I discussed how to progress their exercise prescription.  The patient stated that their goals were to increase walking endurance.  The patient stated that they understand the exercise prescription.  We reviewed exercise guidelines, target heart rate during exercise, RPE Scale, weather conditions, NTG use, endpoints for exercise, warmup and cool down.  Patient is encouraged to come to me with any questions. I will continue to follow up with the patient to assist them with progression and safety.

## 2018-05-31 ENCOUNTER — Other Ambulatory Visit: Payer: Self-pay | Admitting: Internal Medicine

## 2018-06-03 ENCOUNTER — Encounter (HOSPITAL_COMMUNITY)
Admission: RE | Admit: 2018-06-03 | Discharge: 2018-06-03 | Disposition: A | Discharge status: Home / Self Care | Payer: Medicare Other | Source: Ambulatory Visit | Attending: Emergency Medicine | Admitting: Emergency Medicine

## 2018-06-03 VITALS — Wt 192.2 lb

## 2018-06-03 DIAGNOSIS — J439 Emphysema, unspecified: Secondary | ICD-10-CM

## 2018-06-03 NOTE — Progress Notes (Signed)
Daily Session Note  Patient Details  Name: George Robbins MRN: [id number] Date of Birth: August 17, 1951 Referring Provider:     Pulmonary Rehab Walk Test from 04/29/2018 in [place]  Referring Provider  Dr. Lamonte Sakai      Encounter Date: 06/03/2018  Check In: Session Check In - 06/03/18 1030      Check-In   Location  [facility]    Staff Present  Rosebud Poles, RN, BSN;Carlette Wilber Oliphant, RN, BSN;Lisa Ysidro Evert, Felipe Drone, RN, [facility]    Supervising physician immediately available to respond to emergencies  [facility] immediately available    Physician(s)  Dr. Verlon Au    Medication changes reported      No    Fall or balance concerns reported     No    Tobacco Cessation  No Change    Warm-up and Cool-down  Performed as group-led instruction    Resistance Training Performed  Yes    VAD Patient?  No    PAD/SET Patient?  No      Pain Assessment   Currently in Pain?  No/denies    Multiple Pain Sites  No       Capillary Blood Glucose: No results found for this or any previous visit (from the past 24 hour(s)).  Exercise Prescription Changes - 06/03/18 1300      Response to Exercise   Blood Pressure (Admit)  106/66    Blood Pressure (Exercise)  124/70    Blood Pressure (Exit)  120/70    Heart Rate (Admit)  78 bpm    Heart Rate (Exercise)  100 bpm    Heart Rate (Exit)  82 bpm    Oxygen Saturation (Admit)  96 %    Oxygen Saturation (Exercise)  95 %    Oxygen Saturation (Exit)  94 %    Rating of Perceived Exertion (Exercise)  12    Perceived Dyspnea (Exercise)  1    Duration  Progress to 45 minutes of aerobic exercise without signs/symptoms of physical distress    Intensity  THRR unchanged      Progression   Progression  Continue to progress workloads to maintain intensity without signs/symptoms of physical distress.      Resistance Training   Training Prescription  Yes    Weight  blue bands    Reps  10-15    Time  10 Minutes      Interval Training   Interval Training  No      Oxygen   Oxygen  Continuous    Liters  2      Bike   Level  0.8    Minutes  17      NuStep   Level  3    SPM  80    Minutes  17    METs  3      Track   Laps  9    Minutes  17       Social History   Tobacco Use  Smoking Status Former Smoker  . Packs/day: 1.00  . Years: 30.00  . Pack years: 30.00  . Types: Cigarettes  . Last attempt to quit: 04/02/2004  . Years since quitting: 14.1  Smokeless Tobacco Never Used    Goals Met:  Exercise tolerated well Strength training completed today  Goals Unmet:  Not Applicable  Comments: Service time is from 1030 to 1210    Dr. Rush Farmer is Medical Director for Pulmonary Rehab  at [facility].

## 2018-06-05 ENCOUNTER — Encounter (HOSPITAL_COMMUNITY)
Admission: RE | Admit: 2018-06-05 | Discharge: 2018-06-05 | Disposition: A | Discharge status: Home / Self Care | Payer: Medicare Other | Source: Ambulatory Visit | Attending: Emergency Medicine | Admitting: Emergency Medicine

## 2018-06-05 VITALS — Wt 193.3 lb

## 2018-06-05 DIAGNOSIS — J439 Emphysema, unspecified: Secondary | ICD-10-CM | POA: Diagnosis not present

## 2018-06-05 NOTE — Progress Notes (Signed)
Daily Session Note  Patient Details  Name: George Robbins MRN: [id number] Date of Birth: Nov 30, 1950 Referring Provider:     Pulmonary Rehab Walk Test from 04/29/2018 in [place]  Referring Provider  Dr. Lamonte Sakai      Encounter Date: 06/05/2018  Check In: Session Check In - 06/05/18 1204      Check-In   Location  [facility]    Staff Present  Rosebud Poles, RN, BSN;Carlette Wilber Oliphant, RN, BSN;Lisa Ysidro Evert, Felipe Drone, RN, [facility]    Supervising physician immediately available to respond to emergencies  [facility] immediately available    Physician(s)  Dr. Broadus John    Medication changes reported      No    Fall or balance concerns reported     No    Tobacco Cessation  No Change    Warm-up and Cool-down  Performed as group-led instruction    Resistance Training Performed  Yes    VAD Patient?  No    PAD/SET Patient?  No      Pain Assessment   Currently in Pain?  No/denies    Multiple Pain Sites  No       Capillary Blood Glucose: No results found for this or any previous visit (from the past 24 hour(s)).    Social History   Tobacco Use  Smoking Status Former Smoker  . Packs/day: 1.00  . Years: 30.00  . Pack years: 30.00  . Types: Cigarettes  . Last attempt to quit: 04/02/2004  . Years since quitting: 14.1  Smokeless Tobacco Never Used    Goals Met:  Exercise tolerated well Strength training completed today  Goals Unmet:  Not Applicable  Comments: Service time is from 1030 to 1225    Dr. Rush Farmer is Medical Director for Pulmonary Rehab at [facility].

## 2018-06-10 ENCOUNTER — Encounter (HOSPITAL_COMMUNITY)
Admission: RE | Admit: 2018-06-10 | Discharge: 2018-06-10 | Disposition: A | Discharge status: Home / Self Care | Payer: Medicare Other | Source: Ambulatory Visit | Attending: Emergency Medicine | Admitting: Emergency Medicine

## 2018-06-10 VITALS — Wt 192.7 lb

## 2018-06-10 DIAGNOSIS — J439 Emphysema, unspecified: Secondary | ICD-10-CM

## 2018-06-10 NOTE — Progress Notes (Signed)
Daily Session Note  Patient Details  Name: George Robbins MRN: [id number] Date of Birth: 1951-09-09 Referring Provider:     Pulmonary Rehab Walk Test from 04/29/2018 in [place]  Referring Provider  Dr. Lamonte Sakai      Encounter Date: 06/10/2018  Check In: Session Check In - 06/10/18 1030      Check-In   Location  [facility]    Staff Present  Rosebud Poles, RN, BSN;Carlette Carlton, RN, [facility], MS, ACSM RCEP, Exercise Physiologist;Lisa Ysidro Evert, Felipe Drone, RN, [facility]    Supervising physician immediately available to respond to emergencies  [facility] immediately available    Physician(s)  Dr. Broadus John    Medication changes reported      No    Fall or balance concerns reported     No    Tobacco Cessation  No Change    Warm-up and Cool-down  Performed as group-led instruction    Resistance Training Performed  Yes    VAD Patient?  No    PAD/SET Patient?  No      Pain Assessment   Currently in Pain?  No/denies    Multiple Pain Sites  No       Capillary Blood Glucose: No results found for this or any previous visit (from the past 24 hour(s)).    Social History   Tobacco Use  Smoking Status Former Smoker  . Packs/day: 1.00  . Years: 30.00  . Pack years: 30.00  . Types: Cigarettes  . Last attempt to quit: 04/02/2004  . Years since quitting: 14.1  Smokeless Tobacco Never Used    Goals Met:  Exercise tolerated well Strength training completed today  Goals Unmet:  Not Applicable  Comments: Service time is from 1030 to 1205    Dr. Rush Farmer is Medical Director for Pulmonary Rehab at [facility].

## 2018-06-12 ENCOUNTER — Encounter (HOSPITAL_COMMUNITY)
Admission: RE | Admit: 2018-06-12 | Discharge: 2018-06-12 | Disposition: A | Discharge status: Home / Self Care | Payer: Medicare Other | Source: Ambulatory Visit | Attending: Emergency Medicine | Admitting: Emergency Medicine

## 2018-06-12 VITALS — Wt 193.6 lb

## 2018-06-12 DIAGNOSIS — J439 Emphysema, unspecified: Secondary | ICD-10-CM | POA: Diagnosis not present

## 2018-06-12 NOTE — Progress Notes (Signed)
Daily Session Note  Patient Details  Name: George Robbins MRN: [id number] Date of Birth: 07/24/1951 Referring Provider:     Pulmonary Rehab Walk Test from 04/29/2018 in [facility]  Referring Provider  Dr. Byrum      Encounter Date: 06/12/2018  Check In: Session Check In - 06/12/18 1030      Check-In   Location  [facility]    Staff Present  Joan Behrens, RN, BSN;Carlette Carlton, RN, BSN;Molly DiVincenzo, MS, ACSM RCEP, Exercise Physiologist;Lisa Hughes, RN;Annedrea Stackhouse, RN, MHA    Supervising physician immediately available to respond to emergencies  [facility] immediately available    Physician(s)  Dr. Emokpae    Medication changes reported      No    Fall or balance concerns reported     No    Tobacco Cessation  No Change    Warm-up and Cool-down  Performed as group-led instruction    Resistance Training Performed  Yes    VAD Patient?  No    PAD/SET Patient?  No      Pain Assessment   Currently in Pain?  No/denies    Multiple Pain Sites  No       Capillary Blood Glucose: No results found for this or any previous visit (from the past 24 hour(s)).    Social History   Tobacco Use  Smoking Status Former Smoker  . Packs/day: 1.00  . Years: 30.00  . Pack years: 30.00  . Types: Cigarettes  . Last attempt to quit: 04/02/2004  . Years since quitting: 14.2  Smokeless Tobacco Never Used    Goals Met:  Exercise tolerated well Strength training completed today  Goals Unmet:  Not Applicable  Comments: Service time is from 1030 to 1210    Dr. Wesam G. Yacoub is Medical Director for Pulmonary Rehab at [facility]. 

## 2018-06-12 NOTE — Progress Notes (Signed)
Daily Session Note  Patient Details  Name: George Robbins MRN: [id number] Date of Birth: 01/14/1951 Referring Provider:     Pulmonary Rehab Walk Test from 04/29/2018 in [facility]  Referring Provider  Dr. Byrum      Encounter Date: 06/12/2018  Check In: Session Check In - 06/12/18 1030      Check-In   Location  [facility]    Staff Present  Joan Behrens, RN, BSN;Carlette Carlton, RN, BSN;Molly DiVincenzo, MS, ACSM RCEP, Exercise Physiologist;Lisa Hughes, RN;Annedrea Stackhouse, RN, MHA    Supervising physician immediately available to respond to emergencies  [facility] immediately available    Physician(s)  Dr. Emokpae    Medication changes reported      No    Fall or balance concerns reported     No    Tobacco Cessation  No Change    Warm-up and Cool-down  Performed as group-led instruction    Resistance Training Performed  Yes    VAD Patient?  No    PAD/SET Patient?  No      Pain Assessment   Currently in Pain?  No/denies    Multiple Pain Sites  No       Capillary Blood Glucose: No results found for this or any previous visit (from the past 24 hour(s)).    Social History   Tobacco Use  Smoking Status Former Smoker  . Packs/day: 1.00  . Years: 30.00  . Pack years: 30.00  . Types: Cigarettes  . Last attempt to quit: 04/02/2004  . Years since quitting: 14.2  Smokeless Tobacco Never Used    Goals Met:  Exercise tolerated well Strength training completed today  Goals Unmet:  Not Applicable  Comments: Service time is from 1030 to 1210    Dr. Wesam G. Yacoub is Medical Director for Pulmonary Rehab at [facility]. 

## 2018-06-16 ENCOUNTER — Telehealth: Payer: Self-pay | Admitting: Pulmonary Disease

## 2018-06-16 ENCOUNTER — Ambulatory Visit: Payer: Medicare Other | Admitting: Pulmonary Disease

## 2018-06-16 NOTE — Telephone Encounter (Signed)
Patient called back - I have r/s his appt with Aaron Edelman to 06/27/18. However, I just noticed that pt also has appt with Dr. Elsworth Soho on 06/30/18- does that appt need to be canceled? Patient also states that he doesn't know if his machine has a SD card in it - pr

## 2018-06-16 NOTE — Progress Notes (Addendum)
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 Moderate depression, 15-19 = Moderately severe depression, 20-27 = Severe depression   Psychosocial Evaluation and Intervention: Psychosocial Evaluation - 06/16/18 1407      Psychosocial Evaluation & Interventions   Interventions  Stress management education;Relaxation education;Encouraged to exercise with the program and follow exercise prescription    Comments  Pt with increased independence by using [employer] for trips to pulmonary rehab.  Pt does not drive and had to depend upon family.  Pt feels a boost in his self confidence due to this new level of independence.  Will work with pt regarding calling to set his reservations up for the following week.    Expected Outcomes  Pt will demonstrate positive and healthy coping skills to manage his stress.     Continue Psychosocial Services   Follow up required by staff       Psychosocial Re-Evaluation: Psychosocial Re-Evaluation    [place] Name 05/26/18 1405 06/16/18 1515           Psychosocial Re-Evaluation   Current issues with  None Identified  None Identified      Interventions  Encouraged to attend Pulmonary Rehabilitation for the exercise  Encouraged to attend Pulmonary Rehabilitation for the exercise      Continue Psychosocial Services   No Follow up required  No Follow up required         Psychosocial Discharge (Final Psychosocial Re-Evaluation): Psychosocial Re-Evaluation - 06/16/18 1515      Psychosocial Re-Evaluation   Current issues with  None Identified    Interventions  Encouraged to attend Pulmonary Rehabilitation for the exercise    Continue Psychosocial Services   No Follow up required       Vocational Rehabilitation: Provide vocational rehab assistance to qualifying candidates.   Vocational Rehab Evaluation & Intervention: Vocational Rehab - 04/25/18 1036      Initial Vocational Rehab Evaluation & Intervention   Assessment shows need for Vocational Rehabilitation  No       Education: Education Goals: Education classes will be provided on a weekly basis, covering required topics. Participant will state understanding/return demonstration of topics presented.  Learning Barriers/Preferences: Learning Barriers/Preferences - 04/25/18 1041      Learning Barriers/Preferences   Learning Barriers  -- Pt does not know how to read       Education Topics: Count Your Pulse:  -Group instruction provided by verbal instruction, demonstration, patient participation and written materials to support subject.  Instructors address importance of being able to find your pulse and how to count your pulse when at home without a heart monitor.  Patients get hands on experience counting their pulse with staff help and individually.   Heart Attack, Angina, and Risk Factor  Modification:  -Group instruction provided by verbal instruction, video, and written materials to support subject.  Instructors address signs and symptoms of angina and heart attacks.    Also discuss risk factors for heart disease and how to make changes to improve  heart health risk factors.   Functional Fitness:  -Group instruction provided by verbal instruction, demonstration, patient participation, and written materials to support subject.  Instructors address safety measures for doing things around the house.  Discuss how to get up and down off the floor, how to pick things up properly, how to safely get out of a chair without assistance, and balance training.   Meditation and Mindfulness:  -Group instruction provided by verbal instruction, patient participation, and written materials to support subject.  Instructor addresses importance of mindfulness and meditation practice to help reduce stress and improve awareness.  Instructor also leads participants through a meditation exercise.    Stretching for Flexibility and Mobility:  -Group instruction provided by verbal instruction, patient participation, and written materials to support subject.  Instructors lead participants through series of stretches that are designed to increase flexibility thus improving mobility.  These stretches are additional exercise for major muscle groups that are typically performed during regular warm up and cool down.   Hands Only CPR:  -Group verbal, video, and participation provides a basic overview of AHA guidelines for community CPR. Role-play of emergencies allow participants the opportunity to practice calling for help and chest compression technique with discussion of AED use.   Hypertension: -Group verbal and written instruction that provides a basic overview of hypertension including the most recent diagnostic guidelines, risk factor reduction with self-care instructions and medication  management.    Nutrition I class: Heart Healthy Eating:  -Group instruction provided by PowerPoint slides, verbal discussion, and written materials to support subject matter. The instructor gives an explanation and review of the Therapeutic Lifestyle Changes diet recommendations, which includes a discussion on lipid goals, dietary fat, sodium, fiber, plant stanol/sterol esters, sugar, and the components of a well-balanced, healthy diet.   Nutrition II class: Lifestyle Skills:  -Group instruction provided by PowerPoint slides, verbal discussion, and written materials to support subject matter. The instructor gives an explanation and review of label reading, grocery shopping for heart health, heart healthy recipe modifications, and ways to make healthier choices when eating out.   Diabetes Question & Answer:  -Group instruction provided by PowerPoint slides, verbal discussion, and written materials to support subject matter. The instructor gives an explanation and review of diabetes co-morbidities, pre- and post-prandial blood glucose goals, pre-exercise blood glucose goals, signs, symptoms, and treatment of hypoglycemia and hyperglycemia, and foot care basics.   Diabetes Blitz:  -Group instruction provided by PowerPoint slides, verbal discussion, and written materials to support subject matter. The instructor gives an explanation and review of the physiology behind type 1 and type 2 diabetes, diabetes medications and rational behind using different medications, pre- and post-prandial blood glucose recommendations and Hemoglobin A1c goals, diabetes diet, and exercise including blood glucose guidelines for exercising safely.    Portion Distortion:  -Group instruction provided by PowerPoint slides, verbal discussion, written materials, and food models to support subject matter. The instructor gives an explanation of serving size versus portion size, changes in portions sizes over the last 20 years,  and what consists of a serving from each food group.   Stress Management:  -Group instruction provided by verbal instruction, video, and written materials to support subject matter.  Instructors review role of stress in heart disease and how to cope with stress positively.     Exercising on Your Own:  -Group instruction provided by verbal instruction, power point, and written materials to support subject.  Instructors discuss benefits of exercise, components of exercise, frequency and  intensity of exercise, and end points for exercise.  Also discuss use of nitroglycerin and activating EMS.  Review options of places to exercise outside of rehab.  Review guidelines for sex with heart disease.   Cardiac Drugs I:  -Group instruction provided by verbal instruction and written materials to support subject.  Instructor reviews cardiac drug classes: antiplatelets, anticoagulants, beta blockers, and statins.  Instructor discusses reasons, side effects, and lifestyle considerations for each drug class.   Cardiac Drugs II:  -Group instruction provided by verbal instruction and written materials to support subject.  Instructor reviews cardiac drug classes: angiotensin converting enzyme inhibitors (ACE-I), angiotensin II receptor blockers (ARBs), nitrates, and calcium channel blockers.  Instructor discusses reasons, side effects, and lifestyle considerations for each drug class.   Anatomy and Physiology of the Circulatory System:  Group verbal and written instruction and models provide basic cardiac anatomy and physiology, with the coronary electrical and arterial systems. Review of: AMI, Angina, Valve disease, Heart Failure, Peripheral Artery Disease, Cardiac Arrhythmia, Pacemakers, and the ICD.   Other Education:  -Group or individual verbal, written, or video instructions that support the educational goals of the cardiac rehab program.   PULMONARY REHAB OTHER RESPIRATORY from 06/05/2018 in [place]  Date  05/22/18  Educator  Shubuta  Instruction Review Code  1- Verbalizes Understanding      Holiday Eating Survival Tips:  -Group instruction provided by PowerPoint slides, verbal discussion, and written materials to support subject matter. The instructor gives patients tips, tricks, and techniques to help them not only survive but enjoy the holidays despite the onslaught of food that accompanies the holidays.   Knowledge Questionnaire Score: Knowledge Questionnaire Score - 05/06/18 1044      Knowledge Questionnaire Score   Pre Score  12/18       Core Components/Risk Factors/Patient Goals at Admission: Personal Goals and Risk Factors at Admission - 04/25/18 1028      Core Components/Risk Factors/Patient Goals on Admission    Weight Management  Weight Loss;Yes    Intervention  Weight Management/Obesity: Establish reasonable short term and long term weight goals.;Weight Management: Provide education and appropriate resources to help participant work on and attain dietary goals.;Obesity: Provide education and appropriate resources to help participant work on and attain dietary goals.;Weight Management: Develop a combined nutrition and exercise program designed to reach desired caloric intake, while maintaining appropriate intake of nutrient and fiber, sodium and fats, and appropriate energy expenditure required for the weight goal.    Admit Weight  196 lb 13.9 oz (89.3 kg)    Goal Weight: Short Term  185 lb (83.9 kg)    Goal Weight: Long Term  180 lb (81.6 kg)    Expected Outcomes  Short Term: Continue to assess and modify interventions until short term weight is achieved;Weight Maintenance: Understanding of the daily nutrition guidelines, which includes 25-35% calories from fat, 7% or less cal from saturated fats, less than 249m cholesterol, less than 1.5gm of sodium, & 5 or more servings of fruits and vegetables  daily;Weight Loss: Understanding of general recommendations for a balanced deficit meal plan, which promotes 1-2 lb weight loss per week and includes a negative energy balance of [phone] kcal/d;Long Term: Adherence to nutrition and physical activity/exercise program aimed toward attainment of established weight goal;Understanding recommendations for meals to include 15-35% energy as protein, 25-35% energy from fat, 35-60% energy from carbohydrates, less than 2054mof dietary cholesterol, 20-35 gm of  total fiber daily;Understanding of distribution of calorie intake throughout the day with the consumption of 4-5 meals/snacks    Improve shortness of breath with ADL's  Yes    Intervention  Provide education, individualized exercise plan and daily activity instruction to help decrease symptoms of SOB with activities of daily living.    Expected Outcomes  Short Term: Improve cardiorespiratory fitness to achieve a reduction of symptoms when performing ADLs;Long Term: Be able to perform more ADLs without symptoms or delay the onset of symptoms    Diabetes  -- diagnosised with "pre diabetes" takes metofrmin but does not check his blood glucoose or have a meter    Lipids  Yes    Intervention  Provide education and support for participant on nutrition & aerobic/resistive exercise along with prescribed medications to achieve LDL <70m, HDL >497m    Expected Outcomes  Short Term: Participant states understanding of desired cholesterol values and is compliant with medications prescribed. Participant is following exercise prescription and nutrition guidelines.;Long Term: Cholesterol controlled with medications as prescribed, with individualized exercise RX and with personalized nutrition plan. Value goals: LDL < 7078mHDL > 40 mg.       Core Components/Risk Factors/Patient Goals Review:  Goals and Risk Factor Review    Row Name 05/26/18 1400 06/16/18 1356 06/18/18 1155         Core Components/Risk  Factors/Patient Goals Review   Personal Goals Review  Weight Management/Obesity;Improve shortness of breath with ADL's;Heart Failure;Diabetes  Weight Management/Obesity;Improve shortness of breath with ADL's;Heart Failure;Diabetes  -     Review  has attended 6 exercise sessions, no weight loss as of yet, heart failure and diabetes are controlled, CBG 90-114 while in program, we are not monitoring further b/c of readings at start of program.  walking 13 laps, level 3 on nustep, .4 level on bike  EddDarwyns attended 12 exercise sessions pt with the additon of oxygen therapy at 3lnc continuous.  Pt demonstrates mangament of his oxygen appropriately.  Pt has attended oxygen saftey class.  Pt manages his heart failure by watching his sodium intake and weighing himself daily.  Pt diabetes is mangaged with low dose of metformin.  Pt A1C is 6.5 in 01/2018.  Pt does not check his blood glucose at home.  After a couple of pre and post checks, rehab staff felt continuing to monitor was not warranted.  Pt shows weight gain from his first day he weighed 86.7  and last wight on 7/18 was 87.8.  Pt has not engaged in consisitent home exercise.  Will talk with pt to discuss barriers that are preventing him from home exercise.  Pt last workloads do show maintaing airydyne to .4; nustep increased to workload of level 4 and increase to 12 laps on the track.  -     Expected Outcomes  see admission goals  see admission goals  see admission goals/outcomes        Core Components/Risk Factors/Patient Goals at Discharge (Final Review):  Goals and Risk Factor Review - 06/18/18 1155      Core Components/Risk Factors/Patient Goals Review   Expected Outcomes  see admission goals/outcomes       ITP Comments: ITP Comments    Row Name 04/25/18 1009 06/16/18 1453         ITP Comments  Dr. WesJennet Maduroedical Director  Dr. WesJennet Maduroedical Director         Comments:  Pt has completed 12 exercise sessions. Carlette  CarArmed forces operational officer  BSN Cardiac and Pulmonary Rehab Nurse Navigator

## 2018-06-16 NOTE — Telephone Encounter (Signed)
Called patient and  Left message making him aware that per NP Warner Mccreedy we can reschedule his appt due to not having atleast 31 days cpap compliance. Informed on voicemail to call office to reschedule as well as to get information regarding download (whether or not he has a SD card or not.) Attempted to call [facility] they do not open until 10am on mondays.

## 2018-06-17 ENCOUNTER — Encounter (HOSPITAL_COMMUNITY)
Admission: RE | Admit: 2018-06-17 | Discharge: 2018-06-17 | Disposition: A | Discharge status: Home / Self Care | Payer: Medicare Other | Source: Ambulatory Visit | Attending: Emergency Medicine | Admitting: Emergency Medicine

## 2018-06-17 VITALS — Wt 194.7 lb

## 2018-06-17 DIAGNOSIS — J439 Emphysema, unspecified: Secondary | ICD-10-CM | POA: Diagnosis not present

## 2018-06-17 NOTE — Progress Notes (Signed)
Daily Session Note  Patient Details  Name: George Robbins MRN: [id number] Date of Birth: 12-Apr-1951 Referring Provider:     Pulmonary Rehab Walk Test from 04/29/2018 in [place]  Referring Provider  Dr. Lamonte Sakai      Encounter Date: 06/17/2018  Check In: Session Check In - 06/17/18 1030      Check-In   Location  [facility]    Staff Present  Rosebud Poles, RN, BSN;Carlette Carlton, RN, [facility]zo, MS, ACSM RCEP, Exercise Physiologist;Lisa Ysidro Evert, Felipe Drone, RN, [facility]    Supervising physician immediately available to respond to emergencies  [facility] immediately available    Physician(s)  Dr. [name]    Medication changes reported      No    Fall or balance concerns reported     No    Tobacco Cessation  No Change    Warm-up and Cool-down  Performed as group-led instruction    Resistance Training Performed  Yes    VAD Patient?  No    PAD/SET Patient?  No      Pain Assessment   Currently in Pain?  No/denies    Multiple Pain Sites  No       Capillary Blood Glucose: No results found for this or any previous visit (from the past 24 hour(s)).  Exercise Prescription Changes - 06/17/18 1200      Response to Exercise   Blood Pressure (Admit)  110/60    Blood Pressure (Exercise)  128/76    Blood Pressure (Exit)  108/60    Heart Rate (Admit)  73 bpm    Heart Rate (Exercise)  88 bpm    Heart Rate (Exit)  61 bpm    Oxygen Saturation (Admit)  98 %    Oxygen Saturation (Exercise)  94 %    Oxygen Saturation (Exit)  96 %    Rating of Perceived Exertion (Exercise)  13    Perceived Dyspnea (Exercise)  2    Duration  Progress to 45 minutes of aerobic exercise without signs/symptoms of physical distress    Intensity  THRR unchanged      Progression   Progression  Continue to progress workloads to maintain intensity without signs/symptoms of physical distress.      Resistance Training   Training  Prescription  Yes    Weight  blue bands    Reps  10-15    Time  10 Minutes      Interval Training   Interval Training  No      Oxygen   Oxygen  Continuous    Liters  3      Bike   Level  0.4    Minutes  17      NuStep   Level  4    SPM  80    Minutes  17    METs  2.7      Track   Laps  13    Minutes  17       Social History   Tobacco Use  Smoking Status Former Smoker  . Packs/day: 1.00  . Years: 30.00  . Pack years: 30.00  . Types: Cigarettes  . Last attempt to quit: 04/02/2004  . Years since quitting: 14.2  Smokeless Tobacco Never Used    Goals Met:  Exercise tolerated well Strength training completed today  Goals Unmet:  Not Applicable  Comments: Service time is from 1030 to 1215    Dr. Lloyd Huger.  Nelda Marseille is Market researcher for Pulmonary Rehab at [facility].

## 2018-06-19 ENCOUNTER — Encounter (HOSPITAL_COMMUNITY)
Admission: RE | Admit: 2018-06-19 | Discharge: 2018-06-19 | Disposition: A | Discharge status: Home / Self Care | Payer: Medicare Other | Source: Ambulatory Visit | Attending: Emergency Medicine | Admitting: Emergency Medicine

## 2018-06-19 DIAGNOSIS — J439 Emphysema, unspecified: Secondary | ICD-10-CM | POA: Diagnosis not present

## 2018-06-19 NOTE — Progress Notes (Signed)
Daily Session Note  Patient Details  Name: George Robbins MRN: [id number] Date of Birth: 19-Jan-1951 Referring Provider:     Pulmonary Rehab Walk Test from 04/29/2018 in [place]  Referring Provider  Dr. Lamonte Sakai      Encounter Date: 06/19/2018  Check In: Session Check In - 06/19/18 1113      Check-In   Supervising physician immediately available to respond to emergencies  [facility] immediately available    Physician(s)  Dr. Tawanna Solo    Location  [facility]    Staff Present  Rodney Langton, RN;Carlette Wilber Oliphant, RN, BSN;Molly DiVincenzo, MS, ACSM RCEP, Exercise Physiologist    Medication changes reported      No    Fall or balance concerns reported     No    Tobacco Cessation  No Change    Warm-up and Cool-down  Performed as group-led instruction    Resistance Training Performed  Yes    VAD Patient?  No    PAD/SET Patient?  No      Pain Assessment   Currently in Pain?  No/denies    Multiple Pain Sites  No       Capillary Blood Glucose: No results found for this or any previous visit (from the past 24 hour(s)).    Social History   Tobacco Use  Smoking Status Former Smoker  . Packs/day: 1.00  . Years: 30.00  . Pack years: 30.00  . Types: Cigarettes  . Last attempt to quit: 04/02/2004  . Years since quitting: 14.2  Smokeless Tobacco Never Used    Goals Met:  Exercise tolerated well No report of cardiac concerns or symptoms Strength training completed today  Goals Unmet:  Not Applicable  Comments: Service time is from 1030 to 1230    Dr. Rush Farmer is Medical Director for Pulmonary Rehab at [facility].

## 2018-06-19 NOTE — Progress Notes (Signed)
Pt in today for pulmonary rehab.  Pt complained to staff that he was having issues with urination.  Pt felt the urination was frequent and urgent in nature.  Pt has  burning when he urinates and has complains of lower back pain.  Pt would like to be seen. Called his primary MD and described symptoms to staff at  office Dr. Wynetta Emery [facility]. Informed that there was no opening and he has an upcoming appt on 8/5.  Asked what pt should do because rehab RN felt he should not wait that long in case this was a Kidney Infection. Advised by Dr. Wynetta Emery staff to have pt go to the urgent care and keep his appt in August.  Explained in great detail with pt what he should do.  Pt unable to go to urgent care  right now because of his SC[employer] ride is scheduled to pick up in a few minutes.  Pt would like his sister to take him.  Advised him he should go today and Friday at the latest.  Pt verbalized understanding.  Will follow up with pt for his follow through. Cherre Huger, BSN Cardiac and Training and development officer

## 2018-06-24 ENCOUNTER — Encounter (HOSPITAL_COMMUNITY)
Admission: RE | Admit: 2018-06-24 | Discharge: 2018-06-24 | Disposition: A | Discharge status: Home / Self Care | Payer: Medicare Other | Source: Ambulatory Visit | Attending: Emergency Medicine | Admitting: Emergency Medicine

## 2018-06-24 DIAGNOSIS — J439 Emphysema, unspecified: Secondary | ICD-10-CM

## 2018-06-24 NOTE — Progress Notes (Signed)
Daily Session Note  Patient Details  Name: ZERICK PREVETTE MRN: [id number] Date of Birth: June 24, 1951 Referring Provider:     Pulmonary Rehab Walk Test from 04/29/2018 in [place]  Referring Provider  Dr. Lamonte Sakai      Encounter Date: 06/24/2018  Check In: Session Check In - 06/24/18 1035      Check-In   Supervising physician immediately available to respond to emergencies  [facility] immediately available    Physician(s)  Dr. Rodena Piety    Location  [facility]    Staff Present  Rodney Langton, RN;Carlette Wilber Oliphant, RN, BSN;Molly DiVincenzo, MS, ACSM RCEP, Exercise Physiologist    Medication changes reported      No    Fall or balance concerns reported     No    Tobacco Cessation  No Change    Warm-up and Cool-down  Performed as group-led instruction    Resistance Training Performed  Yes    VAD Patient?  No      Pain Assessment   Currently in Pain?  No/denies    Multiple Pain Sites  No       Capillary Blood Glucose: No results found for this or any previous visit (from the past 24 hour(s)).    Social History   Tobacco Use  Smoking Status Former Smoker  . Packs/day: 1.00  . Years: 30.00  . Pack years: 30.00  . Types: Cigarettes  . Last attempt to quit: 04/02/2004  . Years since quitting: 14.2  Smokeless Tobacco Never Used    Goals Met:  Exercise tolerated well No report of cardiac concerns or symptoms Strength training completed today  Goals Unmet:  Not Applicable  Comments: Service time is from 1030 to 1205    Dr. Rush Farmer is Medical Director for Pulmonary Rehab at [facility].

## 2018-06-26 ENCOUNTER — Encounter (HOSPITAL_COMMUNITY)
Admission: RE | Admit: 2018-06-26 | Discharge: 2018-06-26 | Disposition: A | Discharge status: Home / Self Care | Payer: Medicare Other | Source: Ambulatory Visit | Attending: Emergency Medicine | Admitting: Emergency Medicine

## 2018-06-26 DIAGNOSIS — J439 Emphysema, unspecified: Secondary | ICD-10-CM | POA: Insufficient documentation
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
## 2018-06-26 NOTE — Progress Notes (Signed)
Daily Session Note  Patient Details  Name: George Robbins MRN: [id number] Date of Birth: 06-24-1951 Referring Provider:     Pulmonary Rehab Walk Test from 04/29/2018 in [place]  Referring Provider  Dr. Lamonte Sakai      Encounter Date: 06/26/2018  Check In: Session Check In - 06/26/18 1122      Check-In   Supervising physician immediately available to respond to emergencies  [facility] immediately available    Physician(s)  Dr. Herbert Moors    Location  [facility]    Staff Present  Su Hilt, MS, ACSM RCEP, Exercise Physiologist;Lisa Ysidro Evert, RN;Carlette Carlton, RN, BSN    Medication changes reported      No    Fall or balance concerns reported     No    Tobacco Cessation  No Change    Warm-up and Cool-down  Performed as group-led instruction    Resistance Training Performed  Yes    VAD Patient?  No    PAD/SET Patient?  No      Pain Assessment   Currently in Pain?  No/denies    Pain Score  0-No pain    Multiple Pain Sites  No       Capillary Blood Glucose: No results found for this or any previous visit (from the past 24 hour(s)).    Social History   Tobacco Use  Smoking Status Former Smoker  . Packs/day: 1.00  . Years: 30.00  . Pack years: 30.00  . Types: Cigarettes  . Last attempt to quit: 04/02/2004  . Years since quitting: 14.2  Smokeless Tobacco Never Used    Goals Met:  Exercise tolerated well No report of cardiac concerns or symptoms Strength training completed today  Goals Unmet:  Not Applicable  Comments: Service time is from 10:30a to 12:30p    Dr. Rush Farmer is Medical Director for Pulmonary Rehab at [facility].

## 2018-06-27 ENCOUNTER — Encounter: Payer: Self-pay | Admitting: Nurse Practitioner

## 2018-06-27 ENCOUNTER — Ambulatory Visit (INDEPENDENT_AMBULATORY_CARE_PROVIDER_SITE_OTHER): Payer: Medicare Other | Admitting: Nurse Practitioner

## 2018-06-27 DIAGNOSIS — J9611 Chronic respiratory failure with hypoxia: Secondary | ICD-10-CM

## 2018-06-27 DIAGNOSIS — G4733 Obstructive sleep apnea (adult) (pediatric): Secondary | ICD-10-CM | POA: Diagnosis not present
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
## 2018-06-27 NOTE — Patient Instructions (Addendum)
Continue CPAP daily CPAP compliance report shows 100% compliance Patient does qualify for O2 Continue current medications Will order 3 month follow up with Dr. Elsworth Soho

## 2018-06-27 NOTE — Assessment & Plan Note (Addendum)
Patient nees to continue 02 at 3L   Continue CPAP at night  O2 sats in office today fell to 87% on RA at rest Patient was placed on O2 at 3L and sats returned to above 90%

## 2018-06-27 NOTE — Assessment & Plan Note (Signed)
Continue CPAP daily CPAP compliance report shows 100% compliance Patient does qualify for O2 Continue current medications Will order 3 month follow up with Dr. Elsworth Soho

## 2018-06-30 ENCOUNTER — Ambulatory Visit: Payer: Medicare Other | Admitting: Pulmonary Disease

## 2018-06-30 ENCOUNTER — Encounter: Payer: Self-pay | Admitting: Internal Medicine

## 2018-06-30 ENCOUNTER — Other Ambulatory Visit: Payer: Self-pay

## 2018-06-30 ENCOUNTER — Ambulatory Visit: Payer: Medicare Other | Attending: Internal Medicine | Admitting: Internal Medicine

## 2018-06-30 VITALS — BP 114/76 | HR 69 | Temp 97.9°F | Resp 16 | Wt 194.2 lb

## 2018-06-30 DIAGNOSIS — Z87891 Personal history of nicotine dependence: Secondary | ICD-10-CM | POA: Insufficient documentation

## 2018-06-30 DIAGNOSIS — Z7982 Long term (current) use of aspirin: Secondary | ICD-10-CM | POA: Diagnosis not present

## 2018-06-30 DIAGNOSIS — K5900 Constipation, unspecified: Secondary | ICD-10-CM | POA: Insufficient documentation

## 2018-06-30 DIAGNOSIS — E039 Hypothyroidism, unspecified: Secondary | ICD-10-CM | POA: Diagnosis not present

## 2018-06-30 DIAGNOSIS — Z560 Unemployment, unspecified: Secondary | ICD-10-CM | POA: Insufficient documentation

## 2018-06-30 DIAGNOSIS — I129 Hypertensive chronic kidney disease with stage 1 through stage 4 chronic kidney disease, or unspecified chronic kidney disease: Secondary | ICD-10-CM | POA: Insufficient documentation

## 2018-06-30 DIAGNOSIS — Z7902 Long term (current) use of antithrombotics/antiplatelets: Secondary | ICD-10-CM | POA: Diagnosis not present

## 2018-06-30 DIAGNOSIS — E6609 Other obesity due to excess calories: Secondary | ICD-10-CM | POA: Insufficient documentation

## 2018-06-30 DIAGNOSIS — H1013 Acute atopic conjunctivitis, bilateral: Secondary | ICD-10-CM | POA: Diagnosis not present

## 2018-06-30 DIAGNOSIS — R350 Frequency of micturition: Secondary | ICD-10-CM | POA: Insufficient documentation

## 2018-06-30 DIAGNOSIS — J9611 Chronic respiratory failure with hypoxia: Secondary | ICD-10-CM | POA: Diagnosis not present

## 2018-06-30 DIAGNOSIS — M545 Low back pain, unspecified: Secondary | ICD-10-CM

## 2018-06-30 DIAGNOSIS — Z7984 Long term (current) use of oral hypoglycemic drugs: Secondary | ICD-10-CM | POA: Diagnosis not present

## 2018-06-30 DIAGNOSIS — I10 Essential (primary) hypertension: Secondary | ICD-10-CM

## 2018-06-30 DIAGNOSIS — R7303 Prediabetes: Secondary | ICD-10-CM | POA: Diagnosis not present

## 2018-06-30 DIAGNOSIS — Z800 Family history of malignant neoplasm of digestive organs: Secondary | ICD-10-CM | POA: Insufficient documentation

## 2018-06-30 DIAGNOSIS — Z79890 Hormone replacement therapy (postmenopausal): Secondary | ICD-10-CM | POA: Insufficient documentation

## 2018-06-30 DIAGNOSIS — G8929 Other chronic pain: Secondary | ICD-10-CM

## 2018-06-30 DIAGNOSIS — G4733 Obstructive sleep apnea (adult) (pediatric): Secondary | ICD-10-CM | POA: Diagnosis not present

## 2018-06-30 DIAGNOSIS — K219 Gastro-esophageal reflux disease without esophagitis: Secondary | ICD-10-CM | POA: Diagnosis not present

## 2018-06-30 DIAGNOSIS — Z8249 Family history of ischemic heart disease and other diseases of the circulatory system: Secondary | ICD-10-CM | POA: Insufficient documentation

## 2018-06-30 DIAGNOSIS — Z6830 Body mass index (BMI) 30.0-30.9, adult: Secondary | ICD-10-CM | POA: Diagnosis not present

## 2018-06-30 DIAGNOSIS — E785 Hyperlipidemia, unspecified: Secondary | ICD-10-CM | POA: Diagnosis not present

## 2018-06-30 DIAGNOSIS — Z9989 Dependence on other enabling machines and devices: Secondary | ICD-10-CM | POA: Diagnosis not present

## 2018-06-30 DIAGNOSIS — Z79899 Other long term (current) drug therapy: Secondary | ICD-10-CM | POA: Diagnosis not present

## 2018-06-30 DIAGNOSIS — N401 Enlarged prostate with lower urinary tract symptoms: Secondary | ICD-10-CM

## 2018-06-30 DIAGNOSIS — I25119 Atherosclerotic heart disease of native coronary artery with unspecified angina pectoris: Secondary | ICD-10-CM | POA: Insufficient documentation

## 2018-06-30 DIAGNOSIS — M5136 Other intervertebral disc degeneration, lumbar region: Secondary | ICD-10-CM | POA: Diagnosis not present

## 2018-06-30 DIAGNOSIS — Z09 Encounter for follow-up examination after completed treatment for conditions other than malignant neoplasm: Secondary | ICD-10-CM | POA: Diagnosis not present

## 2018-06-30 DIAGNOSIS — J449 Chronic obstructive pulmonary disease, unspecified: Secondary | ICD-10-CM | POA: Diagnosis not present

## 2018-06-30 DIAGNOSIS — Z888 Allergy status to other drugs, medicaments and biological substances status: Secondary | ICD-10-CM | POA: Insufficient documentation

## 2018-06-30 DIAGNOSIS — Z833 Family history of diabetes mellitus: Secondary | ICD-10-CM | POA: Insufficient documentation

## 2018-06-30 DIAGNOSIS — Z9889 Other specified postprocedural states: Secondary | ICD-10-CM | POA: Insufficient documentation

## 2018-06-30 DIAGNOSIS — Z9981 Dependence on supplemental oxygen: Secondary | ICD-10-CM | POA: Diagnosis not present

## 2018-06-30 DIAGNOSIS — Z7952 Long term (current) use of systemic steroids: Secondary | ICD-10-CM | POA: Insufficient documentation

## 2018-06-30 DIAGNOSIS — Z955 Presence of coronary angioplasty implant and graft: Secondary | ICD-10-CM | POA: Insufficient documentation

## 2018-06-30 MED ORDER — DICLOFENAC SODIUM 1 % TD GEL: 2.0000 g | Freq: Four times a day (QID) | TRANSDERMAL | 0 refills | Status: DC

## 2018-06-30 MED ORDER — CETIRIZINE HCL 10 MG PO TABS: 10.0000 mg | ORAL_TABLET | Freq: Every day | ORAL | 2 refills | Status: DC

## 2018-06-30 MED ORDER — OLOPATADINE HCL 0.1 % OP SOLN: 1.0000 [drp] | Freq: Every day | OPHTHALMIC | 0 refills | Status: DC | PRN
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
## 2018-06-30 NOTE — Progress Notes (Signed)
Pt states he has been having some discomfort in his b/l shoulders and lower back

## 2018-07-01 ENCOUNTER — Encounter (HOSPITAL_COMMUNITY)
Admission: RE | Admit: 2018-07-01 | Discharge: 2018-07-01 | Disposition: A | Discharge status: Home / Self Care | Payer: Medicare Other | Source: Ambulatory Visit | Attending: Emergency Medicine | Admitting: Emergency Medicine

## 2018-07-01 VITALS — Wt 196.7 lb

## 2018-07-01 DIAGNOSIS — J439 Emphysema, unspecified: Secondary | ICD-10-CM

## 2018-07-01 NOTE — Progress Notes (Signed)
Daily Session Note  Patient Details  Name: George Robbins MRN: [id number] Date of Birth: 07/12/1951 Referring Provider:     Pulmonary Rehab Walk Test from 04/29/2018 in [facility]  Referring Provider  Dr. Byrum      Encounter Date: 07/01/2018  Check In: Session Check In - 07/01/18 1045      Check-In   Supervising physician immediately available to respond to emergencies  [facility] immediately available    Physician(s)  Dr. Amin    Location  [facility]    Staff Present  Molly DiVincenzo, MS, ACSM RCEP, Exercise Physiologist;Lisa Hughes, RN;Carlette Carlton, RN, BSN    Medication changes reported      No    Fall or balance concerns reported     No    Tobacco Cessation  No Change    Warm-up and Cool-down  Performed as group-led instruction    Resistance Training Performed  Yes    VAD Patient?  No    PAD/SET Patient?  No      Pain Assessment   Currently in Pain?  No/denies    Multiple Pain Sites  No       Capillary Blood Glucose: No results found for this or any previous visit (from the past 24 hour(s)).  Exercise Prescription Changes - 07/01/18 1200      Response to Exercise   Blood Pressure (Admit)  118/60    Blood Pressure (Exercise)  122/70    Blood Pressure (Exit)  100/60    Heart Rate (Admit)  75 bpm    Heart Rate (Exercise)  89 bpm    Heart Rate (Exit)  74 bpm    Oxygen Saturation (Admit)  97 %    Oxygen Saturation (Exercise)  94 %    Oxygen Saturation (Exit)  96 %    Rating of Perceived Exertion (Exercise)  12    Perceived Dyspnea (Exercise)  2    Duration  Progress to 45 minutes of aerobic exercise without signs/symptoms of physical distress    Intensity  THRR unchanged      Progression   Progression  Continue to progress workloads to maintain intensity without signs/symptoms of physical distress.      Resistance Training   Training Prescription  Yes    Weight  blue bands    Reps  10-15    Time  10 Minutes      Interval Training   Interval Training  No      Oxygen   Oxygen  Continuous    Liters  3      NuStep   Level  5    SPM  80    Minutes  17    METs  2.7      Track   Laps  16    Minutes  17       Social History   Tobacco Use  Smoking Status Former Smoker  . Packs/day: 1.00  . Years: 30.00  . Pack years: 30.00  . Types: Cigarettes  . Last attempt to quit: 04/02/2004  . Years since quitting: 14.2  Smokeless Tobacco Never Used    Goals Met:  Exercise tolerated well No report of cardiac concerns or symptoms Strength training completed today  Goals Unmet:  Not Applicable  Comments: Service time is from 10:30a to 12:15p    Dr. Wesam G. Yacoub is Medical Director for Pulmonary Rehab at [facility]. 

## 2018-07-02 ENCOUNTER — Other Ambulatory Visit: Payer: Self-pay | Admitting: Internal Medicine

## 2018-07-02 DIAGNOSIS — H1013 Acute atopic conjunctivitis, bilateral: Secondary | ICD-10-CM

## 2018-07-03 ENCOUNTER — Encounter (HOSPITAL_COMMUNITY)
Admission: RE | Admit: 2018-07-03 | Discharge: 2018-07-03 | Disposition: A | Discharge status: Home / Self Care | Payer: Medicare Other | Source: Ambulatory Visit | Attending: Emergency Medicine | Admitting: Emergency Medicine

## 2018-07-03 DIAGNOSIS — J439 Emphysema, unspecified: Secondary | ICD-10-CM | POA: Diagnosis not present

## 2018-07-03 NOTE — Telephone Encounter (Signed)
Called Pharmacy and gave verbal order since there was an error sending electronically.

## 2018-07-03 NOTE — Progress Notes (Signed)
Daily Session Note  Patient Details  Name: SIRR KABEL MRN: [id number] Date of Birth: Feb 23, 1951 Referring Provider:     Pulmonary Rehab Walk Test from 04/29/2018 in [place]  Referring Provider  Dr. Lamonte Sakai      Encounter Date: 07/03/2018  Check In: Session Check In - 07/03/18 1239      Check-In   Supervising physician immediately available to respond to emergencies  [facility] immediately available    Physician(s)  Dr. Maryland Pink    Location  [facility]    Staff Present  Su Hilt, MS, ACSM RCEP, Exercise Physiologist;Lisa Colletta Maryland, RN, MHA    Medication changes reported      No    Fall or balance concerns reported     No    Tobacco Cessation  No Change    Warm-up and Cool-down  Performed as group-led instruction    Resistance Training Performed  Yes    VAD Patient?  No      Pain Assessment   Currently in Pain?  No/denies    Multiple Pain Sites  No       Capillary Blood Glucose: No results found for this or any previous visit (from the past 24 hour(s)).    Social History   Tobacco Use  Smoking Status Former Smoker  . Packs/day: 1.00  . Years: 30.00  . Pack years: 30.00  . Types: Cigarettes  . Last attempt to quit: 04/02/2004  . Years since quitting: 14.2  Smokeless Tobacco Never Used    Goals Met:  Achieving weight loss Exercise tolerated well Personal goals reviewed  Goals Unmet:  Not Applicable  Comments: Service time is from 10:30a to 12:15p    Dr. Rush Farmer is Medical Director for Pulmonary Rehab at [facility].

## 2018-07-08 ENCOUNTER — Encounter (HOSPITAL_COMMUNITY): Payer: Medicare Other

## 2018-07-10 ENCOUNTER — Encounter (HOSPITAL_COMMUNITY)
Admission: RE | Admit: 2018-07-10 | Discharge: 2018-07-10 | Disposition: A | Discharge status: Home / Self Care | Payer: Medicare Other | Source: Ambulatory Visit | Attending: Emergency Medicine | Admitting: Emergency Medicine

## 2018-07-10 DIAGNOSIS — J439 Emphysema, unspecified: Secondary | ICD-10-CM | POA: Diagnosis not present

## 2018-07-10 NOTE — Progress Notes (Signed)
  Daily Session Note  Patient Details  Name: George Robbins MRN: [id number] Date of Birth: [birth year] Referring Provider:     Pulmonary Rehab Walk Test from 04/29/2018 in [facility]  Referring Provider  Dr. Lamonte Sakai      Encounter Date: 07/10/2018  Check In: Session Check In - 07/10/18 1238      Check-In   Supervising physician immediately available to respond to emergencies  [facility] immediately available    Physician(s)   Dr. Florene Glen    Location  [facility]    Staff Present  Su Hilt, MS, ACSM RCEP, Exercise Physiologist;Lisa Ysidro Evert, RN;Carlette Carlton, RN, BSN    Medication changes reported      No    Fall or balance concerns reported     No    Tobacco Cessation  No Change    Warm-up and Cool-down  Performed as group-led instruction    Resistance Training Performed  Yes    VAD Patient?  No    PAD/SET Patient?  No      Pain Assessment   Currently in Pain?  No/denies    Multiple Pain Sites  No       Capillary Blood Glucose: No results found for this or any previous visit (from the past 24 hour(s)).    Social History   Tobacco Use  Smoking Status Former Smoker  . Packs/day: 1.00  . Years: 30.00  . Pack years: 30.00  . Types: Cigarettes  . Last attempt to quit: 04/02/2004  . Years since quitting: 14.2  Smokeless Tobacco Never Used    Goals Met:  Achieving weight loss Exercise tolerated well Personal goals reviewed  Goals Unmet:  Not Applicable  Comments: Service time is from 10:30a to 12:30p    Dr. Rush Farmer is Medical Director for Pulmonary Rehab at [facility].

## 2018-07-15 ENCOUNTER — Encounter (HOSPITAL_COMMUNITY)
Admission: RE | Admit: 2018-07-15 | Discharge: 2018-07-15 | Disposition: A | Discharge status: Home / Self Care | Payer: Medicare Other | Source: Ambulatory Visit | Attending: Emergency Medicine | Admitting: Emergency Medicine

## 2018-07-15 VITALS — Wt 194.4 lb

## 2018-07-15 DIAGNOSIS — J439 Emphysema, unspecified: Secondary | ICD-10-CM | POA: Diagnosis not present

## 2018-07-15 NOTE — Progress Notes (Signed)
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   Through exercise at rehab and at home, the patient will decrease shortness of breath with daily activities and feel confident in carrying out an exercise regime at home.   (Pended)   Through exercise at rehab and at home, the patient will decrease shortness of breath with daily activities and feel confident in carrying out an exercise regime at home.        Discharge Exercise Prescription (Final Exercise Prescription Changes): Exercise Prescription Changes - 07/15/18 1200      Response to Exercise   Blood Pressure (Admit)  130/70    Blood Pressure (Exercise)  144/62    Blood Pressure (Exit)  108/72    Heart Rate (Admit)  76 bpm    Heart Rate (Exercise)  104 bpm    Heart Rate (Exit)  83 bpm    Oxygen Saturation (Admit)  96 %    Oxygen Saturation (Exercise)  93 %    Oxygen Saturation (Exit)  99 %    Rating of  Perceived Exertion (Exercise)  12    Perceived Dyspnea (Exercise)  1    Duration  Continue with 45 min of aerobic exercise without signs/symptoms of physical distress.    Intensity  THRR unchanged      Progression   Progression  Continue to progress workloads to maintain intensity without signs/symptoms of physical distress.      Resistance Training   Training Prescription  Yes    Weight  blue bands    Reps  10-15    Time  10 Minutes      Oxygen   Oxygen  Continuous    Liters  3      Bike   Level  0.8    Minutes  17      NuStep   Level  5    SPM  80    Minutes  17    METs  2.4      Track   Laps  14    Minutes  17       Nutrition:  Target Goals: Understanding of nutrition guidelines, daily intake of sodium <155m, cholesterol <2063m calories 30% from fat and 7% or less from saturated fats, daily to have 5 or more servings of fruits and vegetables.  Biometrics:    Nutrition Therapy Plan and Nutrition Goals: Nutrition Therapy & Goals - 04/28/18 1211      Nutrition Therapy   Diet  Consistent Carb, Heart Healthy      Intervention Plan   Intervention  Prescribe, educate and counsel regarding individualized specific dietary modifications aiming towards targeted core components such as weight, hypertension, lipid management, diabetes, heart failure and other comorbidities.    Expected Outcomes  Short Term Goal: Understand basic principles of dietary content, such as calories, fat, sodium, cholesterol and nutrients.;Long Term Goal: Adherence to prescribed nutrition plan.       Nutrition Assessments: Nutrition Assessments -  04/28/18 1211      Rate Your Plate Scores   Pre Score  48       Nutrition Goals Re-Evaluation:   Nutrition Goals Discharge (Final Nutrition Goals Re-Evaluation):   Psychosocial: Target Goals: Acknowledge presence or absence of significant depression and/or stress, maximize coping skills, provide positive support system. Participant is  able to verbalize types and ability to use techniques and skills needed for reducing stress and depression.  Initial Review & Psychosocial Screening: Initial Psych Review & Screening - 04/25/18 1031      Family Dynamics   Good Support System?  Yes   Graddaughter live with him.  however granddaughter is disabled   Concerns  Recent loss of significant other    Comments  Pt wife passed away unexpectedly in a car accident in 2011       Quality of Life Scores:  Scores of 19 and below usually indicate a poorer quality of life in these areas.  A difference of  2-3 points is a clinically meaningful difference.  A difference of 2-3 points in the total score of the Quality of Life Index has been associated with significant improvement in overall quality of life, self-image, physical symptoms, and general health in studies assessing change in quality of life.  PHQ-9: Recent Review Flowsheet Data    Depression screen [facility] 2/9 06/30/2018 04/25/2018 02/14/2018 05/10/2017 04/09/2017   Decreased Interest 0 0 0 0 0   Down, Depressed, Hopeless 0 1 0 0 1   PHQ - 2 Score 0 1 0 0 1   Altered sleeping - 3  0 - -   Tired, decreased energy - 1 1 - -   Change in appetite - 0 0 - -   Feeling bad or failure about yourself  - 1 0 - -   Trouble concentrating - 0 0 - -   Moving slowly or fidgety/restless - 0 0 - -   Suicidal thoughts - 0 0 - -   PHQ-9 Score - 6 1 - -   Difficult doing work/chores - Not difficult at all - - -     Interpretation of Total Score  Total Score Depression Severity:  1-4 = Minimal depression, 5-9 = Mild depression, 10-14 = Moderate depression, 15-19 = Moderately severe depression, 20-27 = Severe depression   Psychosocial Evaluation and Intervention: Psychosocial Evaluation - 07/15/18 1711      Psychosocial Evaluation & Interventions   Comments  Pt with increased independence by using [facility] for trips to pulmonary rehab.  Pt does not drive and had to depend upon family.  Pt feels a boost  in his self confidence due to this new level of independence.     Expected Outcomes  Pt will demonstrate positive and healthy coping skills to manage his stress and hopeful outlook    Continue Psychosocial Services   Follow up required by staff       Psychosocial Re-Evaluation: Psychosocial Re-Evaluation    [place] Name 05/26/18 1405 06/16/18 1515 07/15/18 1712         Psychosocial Re-Evaluation   Current issues with  None Identified  None Identified  None Identified     Expected Outcomes  -  -  Continued positive and hopeful outlook and gaining independence by using community services such as [facility]     Interventions  Encouraged to attend Pulmonary Rehabilitation for the exercise  Encouraged to attend Pulmonary Rehabilitation for the exercise  Encouraged to attend Pulmonary Rehabilitation for the exercise  Continue Psychosocial Services   No Follow up required  No Follow up required  No Follow up required        Psychosocial Discharge (Final Psychosocial Re-Evaluation): Psychosocial Re-Evaluation - 07/15/18 1712      Psychosocial Re-Evaluation   Current issues with  None Identified    Expected Outcomes  Continued positive and hopeful outlook and gaining independence by using community services such as [facility]    Interventions  Encouraged to attend Pulmonary Rehabilitation for the exercise    Continue Psychosocial Services   No Follow up required       Education: Education Goals: Education classes will be provided on a weekly basis, covering required topics. Participant will state understanding/return demonstration of topics presented.  Learning Barriers/Preferences: Learning Barriers/Preferences - 04/25/18 1041      Learning Barriers/Preferences   Learning Barriers  --   Pt does not know how to read      Education Topics: Risk Factor Reduction:  -Group instruction that is supported by a PowerPoint presentation. Instructor discusses the definition of a risk factor, different  risk factors for pulmonary disease, and how the heart and lungs work together.     Nutrition for Pulmonary Patient:  -Group instruction provided by PowerPoint slides, verbal discussion, and written materials to support subject matter. The instructor gives an explanation and review of healthy diet recommendations, which includes a discussion on weight management, recommendations for fruit and vegetable consumption, as well as protein, fluid, caffeine, fiber, sodium, sugar, and alcohol. Tips for eating when patients are short of breath are discussed.   Pursed Lip Breathing:  -Group instruction that is supported by demonstration and informational handouts. Instructor discusses the benefits of pursed lip and diaphragmatic breathing and detailed demonstration on how to preform both.     Oxygen Safety:  -Group instruction provided by PowerPoint, verbal discussion, and written material to support subject matter. There is an overview of "What is Oxygen" and "Why do we need it".  Instructor also reviews how to create a safe environment for oxygen use, the importance of using oxygen as prescribed, and the risks of noncompliance. There is a brief discussion on traveling with oxygen and resources the patient may utilize.   PULMONARY REHAB OTHER RESPIRATORY from 07/10/2018 in [place]  Date  06/12/18  Educator  Cloyde Reams  Instruction Review Code  1- Verbalizes Understanding      Oxygen Equipment:  -Group instruction provided by [employer] utilizing handouts, written materials, and Insurance underwriter.   Signs and Symptoms:  -Group instruction provided by written material and verbal discussion to support subject matter. Warning signs and symptoms of infection, stroke, and heart attack are reviewed and when to call the physician/911 reinforced. Tips for preventing the spread of infection discussed.   PULMONARY REHAB OTHER RESPIRATORY from 07/10/2018 in [place]  Date  06/05/18  Educator  Remo Lipps  Instruction Review Code  2- Demonstrated Understanding      Advanced Directives:  -Group instruction provided by verbal instruction and written material to support subject matter. Instructor reviews Advanced Directive laws and proper instruction for filling out document.   Pulmonary Video:  -Group video education that reviews the importance of medication and oxygen compliance, exercise, good nutrition, pulmonary hygiene, and pursed lip and diaphragmatic breathing for the pulmonary patient.   Exercise for the Pulmonary Patient:  -Group instruction that is supported by a PowerPoint presentation. Instructor discusses benefits of exercise, core components of exercise,  frequency, duration, and intensity of an exercise routine, importance of utilizing pulse oximetry during exercise, safety while exercising, and options of places to exercise outside of rehab.     PULMONARY REHAB OTHER RESPIRATORY from 07/10/2018 in [place]  Date  06/26/18  Educator  Cloyde Reams  Instruction Review Code  1- Verbalizes Understanding      Pulmonary Medications:  -Verbally interactive group education provided by instructor with focus on inhaled medications and proper administration.   PULMONARY REHAB OTHER RESPIRATORY from 07/10/2018 in [place]  Date  05/15/18  Educator  pharmacy  Instruction Review Code  1- Verbalizes Understanding      Anatomy and Physiology of the Respiratory System and Intimacy:  -Group instruction provided by PowerPoint, verbal discussion, and written material to support subject matter. Instructor reviews respiratory cycle and anatomical components of the respiratory system and their functions. Instructor also reviews differences in obstructive and restrictive respiratory diseases with examples of each. Intimacy, Sex, and Sexuality differences are reviewed with a  discussion on how relationships can change when diagnosed with pulmonary disease. Common sexual concerns are reviewed.   MD DAY -A group question and answer session with a medical doctor that allows participants to ask questions that relate to their pulmonary disease state.   PULMONARY REHAB OTHER RESPIRATORY from 07/10/2018 in [place]  Date  07/01/18  Educator  yacoub  Instruction Review Code  2- Demonstrated Understanding      OTHER EDUCATION -Group or individual verbal, written, or video instructions that support the educational goals of the pulmonary rehab program.   PULMONARY REHAB OTHER RESPIRATORY from 07/10/2018 in [place]  Date  05/08/18 [Beat a Sedentary Lifestyle]  Therapist, occupational  Instruction Review Code  1- Verbalizes Understanding      Holiday Eating Survival Tips:  -Group instruction provided by [employer], verbal discussion, and written materials to support subject matter. The instructor gives patients tips, tricks, and techniques to help them not only survive but enjoy the holidays despite the onslaught of food that accompanies the holidays.   Knowledge Questionnaire Score: Knowledge Questionnaire Score - 05/06/18 1044      Knowledge Questionnaire Score   Pre Score  12/18       Core Components/Risk Factors/Patient Goals at Admission: Personal Goals and Risk Factors at Admission - 04/25/18 1028      Core Components/Risk Factors/Patient Goals on Admission    Weight Management  Weight Loss;Yes    Intervention  Weight Management/Obesity: Establish reasonable short term and long term weight goals.;Weight Management: Provide education and appropriate resources to help participant work on and attain dietary goals.;Obesity: Provide education and appropriate resources to help participant work on and attain dietary goals.;Weight Management: Develop a combined nutrition and exercise program designed  to reach desired caloric intake, while maintaining appropriate intake of nutrient and fiber, sodium and fats, and appropriate energy expenditure required for the weight goal.    Admit Weight  196 lb 13.9 oz (89.3 kg)    Goal Weight: Short Term  185 lb (83.9 kg)    Goal Weight: Long Term  180 lb (81.6 kg)    Expected Outcomes  Short Term: Continue to assess and modify interventions until short term weight is achieved;Weight Maintenance: Understanding of the daily nutrition guidelines, which includes 25-35% calories from fat, 7% or less cal from saturated fats, less than 242m cholesterol, less than 1.5gm of sodium, & 5  or more servings of fruits and vegetables daily;Weight Loss: Understanding of general recommendations for a balanced deficit meal plan, which promotes 1-2 lb weight loss per week and includes a negative energy balance of [phone] kcal/d;Long Term: Adherence to nutrition and physical activity/exercise program aimed toward attainment of established weight goal;Understanding recommendations for meals to include 15-35% energy as protein, 25-35% energy from fat, 35-60% energy from carbohydrates, less than 263m of dietary cholesterol, 20-35 gm of total fiber daily;Understanding of distribution of calorie intake throughout the day with the consumption of 4-5 meals/snacks    Improve shortness of breath with ADL's  Yes    Intervention  Provide education, individualized exercise plan and daily activity instruction to help decrease symptoms of SOB with activities of daily living.    Expected Outcomes  Short Term: Improve cardiorespiratory fitness to achieve a reduction of symptoms when performing ADLs;Long Term: Be able to perform more ADLs without symptoms or delay the onset of symptoms    Diabetes  --   diagnosised with "pre diabetes" takes metofrmin but does not check his blood glucoose or have a meter   Lipids  Yes    Intervention  Provide education and support for participant on nutrition &  aerobic/resistive exercise along with prescribed medications to achieve LDL <722m HDL >408m   Expected Outcomes  Short Term: Participant states understanding of desired cholesterol values and is compliant with medications prescribed. Participant is following exercise prescription and nutrition guidelines.;Long Term: Cholesterol controlled with medications as prescribed, with individualized exercise RX and with personalized nutrition plan. Value goals: LDL < 43m67mDL > 40 mg.       Core Components/Risk Factors/Patient Goals Review:  Goals and Risk Factor Review    Row Name 05/26/18 1400 06/16/18 1356 06/18/18 1155 07/15/18 1714 07/16/18 0945     Core Components/Risk Factors/Patient Goals Review   Personal Goals Review  Weight Management/Obesity;Improve shortness of breath with ADL's;Heart Failure;Diabetes  Weight Management/Obesity;Improve shortness of breath with ADL's;Heart Failure;Diabetes  -  Weight Management/Obesity;Improve shortness of breath with ADL's;Develop more efficient breathing techniques such as purse lipped breathing and diaphragmatic breathing and practicing self-pacing with activity.;Increase knowledge of respiratory medications and ability to use respiratory devices properly.  -   Review  has attended 6 exercise sessions, no weight loss as of yet, heart failure and diabetes are controlled, CBG 90-114 while in program, we are not monitoring further b/c of readings at start of program.  walking 13 laps, level 3 on nustep, .4 level on bike  EddiChael attended 12 exercise sessions pt with the additon of oxygen therapy at [facility] continuous.  Pt demonstrates mangament of his oxygen appropriately.  Pt has attended oxygen saftey class.  Pt manages his heart failure by watching his sodium intake and weighing himself daily.  Pt diabetes is mangaged with low dose of metformin.  Pt A1C is 6.5 in 01/2018.  Pt does not check his blood glucose at home.  After a couple of pre and post checks, rehab  staff felt continuing to monitor was not warranted.  Pt shows weight gain from his first day he weighed 86.7  and last wight on 7/18 was 87.8.  Pt has not engaged in consisitent home exercise.  Will talk with pt to discuss barriers that are preventing him from home exercise.  Pt last workloads do show maintaing airydyne to .4; nustep increased to workload of level 4 and increase to 12 laps on the track.  -  EddiAmalio attended 20 exercise  sessions pt with the addition of oxygen therapy at [facility]lnc continuous.  Pt demonstrates management of his oxygen outside of rehab.  Pt shows weight gain from his first day he weighed 86.7  and last weight on 8/20 was 88.2  Pt does not have the motivation from his granddaughter that lives with him to encourage him to exercise.  His granddaughter is also on oxygen therapy and is dealing with her own health issues.   Pt last workloads do show maintaing airydyne .8 ,nustep increased to workload of level 5 and increase to 14 laps on the track. Would like to see pt weight decrease in the next 30 days.  -   Expected Outcomes  see admission goals  see admission goals  see admission goals/outcomes  see admission goals/outcomes  see Admission Goals/Outcomes      Core Components/Risk Factors/Patient Goals at Discharge (Final Review):  Goals and Risk Factor Review - 07/16/18 0945      Core Components/Risk Factors/Patient Goals Review   Expected Outcomes  see Admission Goals/Outcomes       ITP Comments: ITP Comments    Row Name 04/25/18 1009 06/16/18 1453 07/16/18 0944       ITP Comments  Dr. Jennet Maduro, Medical Director  Dr. Jennet Maduro, Medical Director  Dr. Jennet Maduro, Medical Director        Comments:  Pt has completed 20 exercise sessions. Cherre Huger, BSN Cardiac and Training and development officer

## 2018-07-15 NOTE — Progress Notes (Signed)
Daily Session Note  Patient Details  Name: George Robbins MRN: [id number] Date of Birth: 16-Mar-1951 Referring Provider:     Pulmonary Rehab Walk Test from 04/29/2018 in [place]  Referring Provider  Dr. Lamonte Sakai      Encounter Date: 07/15/2018  Check In: Session Check In - 07/15/18 1236      Check-In   Supervising physician immediately available to respond to emergencies  [facility] immediately available    Physician(s)   Dr. Florene Glen    Location  [facility]    Staff Present  Su Hilt, MS, ACSM RCEP, Exercise Physiologist;Carlette Wilber Oliphant, RN, BSN;Natia Fahmy Ysidro Evert, Felipe Drone, RN, MHA    Medication changes reported      No    Fall or balance concerns reported     No    Tobacco Cessation  No Change    Warm-up and Cool-down  Performed as group-led instruction    Resistance Training Performed  Yes    VAD Patient?  No    PAD/SET Patient?  No      Pain Assessment   Currently in Pain?  No/denies       Capillary Blood Glucose: No results found for this or any previous visit (from the past 24 hour(s)).  Exercise Prescription Changes - 07/15/18 1200      Response to Exercise   Blood Pressure (Admit)  130/70    Blood Pressure (Exercise)  144/62    Blood Pressure (Exit)  108/72    Heart Rate (Admit)  76 bpm    Heart Rate (Exercise)  104 bpm    Heart Rate (Exit)  83 bpm    Oxygen Saturation (Admit)  96 %    Oxygen Saturation (Exercise)  93 %    Oxygen Saturation (Exit)  99 %    Rating of Perceived Exertion (Exercise)  12    Perceived Dyspnea (Exercise)  1    Duration  Continue with 45 min of aerobic exercise without signs/symptoms of physical distress.    Intensity  THRR unchanged      Progression   Progression  Continue to progress workloads to maintain intensity without signs/symptoms of physical distress.      Resistance Training   Training Prescription  Yes    Weight  blue bands    Reps  10-15     Time  10 Minutes      Oxygen   Oxygen  Continuous    Liters  3      Bike   Level  0.8    Minutes  17      NuStep   Level  5    SPM  80    Minutes  17    METs  2.4      Track   Laps  14    Minutes  17       Social History   Tobacco Use  Smoking Status Former Smoker  . Packs/day: 1.00  . Years: 30.00  . Pack years: 30.00  . Types: Cigarettes  . Last attempt to quit: 04/02/2004  . Years since quitting: 14.2  Smokeless Tobacco Never Used    Goals Met:  Exercise tolerated well No report of cardiac concerns or symptoms Strength training completed today  Goals Unmet:  Not Applicable  Comments: Service time is from 1030 to 1220    Dr. Rush Farmer is Medical Director for Pulmonary Rehab at [facility].

## 2018-07-16 ENCOUNTER — Encounter (HOSPITAL_COMMUNITY): Payer: Self-pay | Admitting: Emergency Medicine

## 2018-07-16 ENCOUNTER — Telehealth (HOSPITAL_COMMUNITY): Payer: Self-pay | Admitting: *Deleted

## 2018-07-16 ENCOUNTER — Emergency Department (HOSPITAL_COMMUNITY): Payer: Medicare Other

## 2018-07-16 ENCOUNTER — Emergency Department (HOSPITAL_COMMUNITY)
Admission: EM | Admit: 2018-07-16 | Discharge: 2018-07-16 | Disposition: A | Discharge status: Home / Self Care | Payer: Medicare Other | Attending: Emergency Medicine | Admitting: Emergency Medicine

## 2018-07-16 DIAGNOSIS — R51 Headache: Secondary | ICD-10-CM | POA: Insufficient documentation

## 2018-07-16 DIAGNOSIS — E039 Hypothyroidism, unspecified: Secondary | ICD-10-CM | POA: Diagnosis not present

## 2018-07-16 DIAGNOSIS — R110 Nausea: Secondary | ICD-10-CM | POA: Diagnosis not present

## 2018-07-16 DIAGNOSIS — I129 Hypertensive chronic kidney disease with stage 1 through stage 4 chronic kidney disease, or unspecified chronic kidney disease: Secondary | ICD-10-CM | POA: Insufficient documentation

## 2018-07-16 DIAGNOSIS — N182 Chronic kidney disease, stage 2 (mild): Secondary | ICD-10-CM | POA: Diagnosis not present

## 2018-07-16 DIAGNOSIS — R05 Cough: Secondary | ICD-10-CM | POA: Diagnosis not present

## 2018-07-16 DIAGNOSIS — Z79899 Other long term (current) drug therapy: Secondary | ICD-10-CM | POA: Insufficient documentation

## 2018-07-16 DIAGNOSIS — Z87891 Personal history of nicotine dependence: Secondary | ICD-10-CM | POA: Insufficient documentation

## 2018-07-16 DIAGNOSIS — G4489 Other headache syndrome: Secondary | ICD-10-CM | POA: Diagnosis not present

## 2018-07-16 DIAGNOSIS — Z7982 Long term (current) use of aspirin: Secondary | ICD-10-CM | POA: Diagnosis not present

## 2018-07-16 DIAGNOSIS — J441 Chronic obstructive pulmonary disease with (acute) exacerbation: Secondary | ICD-10-CM | POA: Diagnosis not present

## 2018-07-16 DIAGNOSIS — I10 Essential (primary) hypertension: Secondary | ICD-10-CM | POA: Diagnosis not present

## 2018-07-16 DIAGNOSIS — R112 Nausea with vomiting, unspecified: Secondary | ICD-10-CM | POA: Diagnosis not present

## 2018-07-16 DIAGNOSIS — R519 Headache, unspecified: Secondary | ICD-10-CM

## 2018-07-16 DIAGNOSIS — R52 Pain, unspecified: Secondary | ICD-10-CM | POA: Diagnosis not present

## 2018-07-16 MED ORDER — PROCHLORPERAZINE EDISYLATE 10 MG/2ML IJ SOLN
10.0000 mg | Freq: Once | INTRAMUSCULAR | Status: AC
  Administered 2018-07-16: 10 mg via INTRAVENOUS
  Filled 2018-07-16: qty 2

## 2018-07-16 MED ORDER — DIPHENHYDRAMINE HCL 50 MG/ML IJ SOLN
25.0000 mg | Freq: Once | INTRAMUSCULAR | Status: AC
  Administered 2018-07-16: 25 mg via INTRAVENOUS
  Filled 2018-07-16: qty 1

## 2018-07-16 MED ORDER — IPRATROPIUM-ALBUTEROL 0.5-2.5 (3) MG/3ML IN SOLN
3.0000 mL | RESPIRATORY_TRACT | Status: AC
  Administered 2018-07-16 (×3): 3 mL via RESPIRATORY_TRACT
  Filled 2018-07-16 (×3): qty 3

## 2018-07-16 MED ORDER — DIPHENHYDRAMINE HCL 50 MG/ML IJ SOLN: 25.0000 mg | Freq: Once | INTRAMUSCULAR | Status: DC

## 2018-07-16 MED ORDER — PROCHLORPERAZINE EDISYLATE 10 MG/2ML IJ SOLN: 10.0000 mg | Freq: Once | INTRAMUSCULAR | Status: DC

## 2018-07-16 MED ORDER — PREDNISONE 20 MG PO TABS: ORAL_TABLET | ORAL | 0 refills | Status: DC

## 2018-07-16 MED ORDER — PREDNISONE 20 MG PO TABS
60.0000 mg | ORAL_TABLET | Freq: Once | ORAL | Status: AC
  Administered 2018-07-16: 60 mg via ORAL
  Filled 2018-07-16: qty 3

## 2018-07-16 NOTE — ED Triage Notes (Signed)
Pt to ER from home for nausea, vomiting, and headache onset this morning at 7 am. Pt in NAD on arrival, is ambulatory from EMS stretcher. BP 168/88, P 84. Pt a/o x4.

## 2018-07-16 NOTE — Discharge Instructions (Signed)
Follow up with your PCP/doctor you see for your headaches.  Return for worsening symptoms, shortness of breath, headache fever.
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
## 2018-07-16 NOTE — ED Notes (Signed)
Patient transported to CT 

## 2018-07-16 NOTE — Telephone Encounter (Signed)
Verified with pt that he wanted rehab staff cancel his ride for tomorrow for pulmonary rehab.  Pt complains of having a bad headache.  Wanted to make sure pt didn't want to wait and see if his headache improved.  Pt stated no. Cherre Huger, BSN Cardiac and Training and development officer

## 2018-07-17 ENCOUNTER — Encounter (HOSPITAL_COMMUNITY): Payer: Medicare Other

## 2018-07-18 ENCOUNTER — Telehealth: Payer: Self-pay | Admitting: Nurse Practitioner

## 2018-07-18 NOTE — Telephone Encounter (Signed)
Spoke with Caryl Pina at [facility] [facility], states that since pt was on prednisone for an exacerbation at his last ov, that qualifying sat will not work for [employer] to cover his O2.  Pt will need to come in during a stable state to be requalified for O2.  Pt is not currently scheduled for an OV. lmtcb X1 for pt to schedule an ov.  Wcb.

## 2018-07-21 NOTE — Telephone Encounter (Signed)
Attempted to call pt. I did not receive an answer. I have left a message for pt to return our call.  

## 2018-07-22 ENCOUNTER — Encounter (HOSPITAL_COMMUNITY)
Admission: RE | Admit: 2018-07-22 | Discharge: 2018-07-22 | Disposition: A | Discharge status: Home / Self Care | Payer: Medicare Other | Source: Ambulatory Visit | Attending: Emergency Medicine | Admitting: Emergency Medicine

## 2018-07-22 DIAGNOSIS — J439 Emphysema, unspecified: Secondary | ICD-10-CM | POA: Diagnosis not present

## 2018-07-22 NOTE — Progress Notes (Signed)
Daily Session Note  Patient Details  Name: George Robbins MRN: [id number] Date of Birth: [birth year] Referring Provider:     Pulmonary Rehab Walk Test from 04/29/2018 in [facility]  Referring Provider  Dr. Lamonte Sakai      Encounter Date: 07/22/2018  Check In: Session Check In - 07/22/18 1154      Check-In   Supervising physician immediately available to respond to emergencies  [facility] immediately available    Physician(s)  Dr. Jonnie Finner     Location  [facility]    Staff Present  Su Hilt, MS, ACSM RCEP, Exercise Physiologist;Carlette Wilber Oliphant, RN, BSN;Rosaly Labarbera Ysidro Evert, RN;Olinty Corbin, MS, ACSM CEP, Exercise Physiologist;Joann Rion, RN, BSN    Medication changes reported      No    Fall or balance concerns reported     No    Tobacco Cessation  No Change    Warm-up and Cool-down  Performed as group-led instruction    Resistance Training Performed  Yes    VAD Patient?  No    PAD/SET Patient?  No      Pain Assessment   Currently in Pain?  No/denies       Capillary Blood Glucose: No results found for this or any previous visit (from the past 24 hour(s)).    Social History   Tobacco Use  Smoking Status Former Smoker  . Packs/day: 1.00  . Years: 30.00  . Pack years: 30.00  . Types: Cigarettes  . Last attempt to quit: 04/02/2004  . Years since quitting: 14.3  Smokeless Tobacco Never Used    Goals Met:  Exercise tolerated well No report of cardiac concerns or symptoms Strength training completed today  Goals Unmet:  Not Applicable  Comments: Service time is from 1030 to 1215    Dr. Rush Farmer is Medical Director for Pulmonary Rehab at [facility].

## 2018-07-22 NOTE — Telephone Encounter (Signed)
Attempted to call pt. I did not receive an answer. I have left a message for pt to return our call.  

## 2018-07-23 NOTE — Telephone Encounter (Signed)
We have attempted to contact the pt several times with no success or call back from the pt. Per triage protocol, message will be closed.  

## 2018-07-24 ENCOUNTER — Encounter (HOSPITAL_COMMUNITY)
Admission: RE | Admit: 2018-07-24 | Discharge: 2018-07-24 | Disposition: A | Discharge status: Home / Self Care | Payer: Medicare Other | Source: Ambulatory Visit | Attending: Emergency Medicine | Admitting: Emergency Medicine

## 2018-07-24 DIAGNOSIS — J439 Emphysema, unspecified: Secondary | ICD-10-CM

## 2018-07-24 NOTE — Progress Notes (Signed)
Daily Session Note  Patient Details  Name: KABE MCKOY MRN: [id number] Date of Birth: 01-05-1951 Referring Provider:     Pulmonary Rehab Walk Test from 04/29/2018 in [facility]  Referring Provider  Dr. Lamonte Sakai      Encounter Date: 07/24/2018  Check In: Session Check In - 07/24/18 1022      Check-In   Supervising physician immediately available to respond to emergencies  [facility] immediately available    Physician(s)  Dr. Florene Glen    Location  [facility]    Staff Present  Su Hilt, MS, ACSM RCEP, Exercise Physiologist;Carlette Wilber Oliphant, RN, BSN;Ramon Dredge, RN, MHA    Medication changes reported      No    Fall or balance concerns reported     No    Tobacco Cessation  No Change    Warm-up and Cool-down  Performed as group-led instruction    Resistance Training Performed  Yes    VAD Patient?  No    PAD/SET Patient?  No      Pain Assessment   Currently in Pain?  No/denies    Multiple Pain Sites  No       Capillary Blood Glucose: No results found for this or any previous visit (from the past 24 hour(s)).    Social History   Tobacco Use  Smoking Status Former Smoker  . Packs/day: 1.00  . Years: 30.00  . Pack years: 30.00  . Types: Cigarettes  . Last attempt to quit: 04/02/2004  . Years since quitting: 14.3  Smokeless Tobacco Never Used    Goals Met:  Exercise tolerated well  Goals Unmet:  Not Applicable  Comments: Service time is from 10:30a to 12:45p    Dr. Rush Farmer is Medical Director for Pulmonary Rehab at [facility]

## 2018-07-29 ENCOUNTER — Encounter (HOSPITAL_COMMUNITY)
Admission: RE | Admit: 2018-07-29 | Discharge: 2018-07-29 | Disposition: A | Discharge status: Home / Self Care | Payer: Medicare Other | Source: Ambulatory Visit | Attending: Emergency Medicine | Admitting: Emergency Medicine

## 2018-07-29 VITALS — Wt 197.3 lb

## 2018-07-29 DIAGNOSIS — J439 Emphysema, unspecified: Secondary | ICD-10-CM | POA: Insufficient documentation

## 2018-07-29 NOTE — Progress Notes (Signed)
Daily Session Note  Patient Details  Name: George Robbins MRN: [id number] Date of Birth: 06/24/1951 Referring Provider:     Pulmonary Rehab Walk Test from 04/29/2018 in [facility]  Referring Provider  Dr. Byrum      Encounter Date: 07/29/2018  Check In: Session Check In - 07/29/18 1026      Check-In   Supervising physician immediately available to respond to emergencies  [facility] immediately available    Physician(s)  Dr. Akula    Location  [facility]    Staff Present  Molly DiVincenzo, MS, ACSM RCEP, Exercise Physiologist;Carlette Carlton, RN, BSN;Annedrea Stackhouse, RN, MHA;Lisa Hughes, RN    Medication changes reported      No    Fall or balance concerns reported     No    Tobacco Cessation  No Change    Warm-up and Cool-down  Performed as group-led instruction    Resistance Training Performed  Yes    VAD Patient?  No    PAD/SET Patient?  No      Pain Assessment   Currently in Pain?  No/denies    Multiple Pain Sites  No       Capillary Blood Glucose: No results found for this or any previous visit (from the past 24 hour(s)).  Exercise Prescription Changes - 07/29/18 1200      Response to Exercise   Blood Pressure (Admit)  116/62    Blood Pressure (Exercise)  118/78    Blood Pressure (Exit)  118/60    Heart Rate (Admit)  74 bpm    Heart Rate (Exercise)  83 bpm    Heart Rate (Exit)  67 bpm    Oxygen Saturation (Admit)  98 %    Oxygen Saturation (Exercise)  94 %    Oxygen Saturation (Exit)  97 %    Rating of Perceived Exertion (Exercise)  15    Perceived Dyspnea (Exercise)  3    Duration  Continue with 45 min of aerobic exercise without signs/symptoms of physical distress.    Intensity  THRR unchanged      Progression   Progression  Continue to progress workloads to maintain intensity without signs/symptoms of physical distress.      Resistance Training   Training Prescription  Yes    Weight  blue  bands    Reps  10-15    Time  10 Minutes      Oxygen   Oxygen  Continuous    Liters  3      Bike   Level  0.7    Minutes  17      NuStep   Level  5    SPM  80    Minutes  17    METs  2.1      Track   Laps  11    Minutes  17       Social History   Tobacco Use  Smoking Status Former Smoker  . Packs/day: 1.00  . Years: 30.00  . Pack years: 30.00  . Types: Cigarettes  . Last attempt to quit: 04/02/2004  . Years since quitting: 14.3  Smokeless Tobacco Never Used    Goals Met:  Exercise tolerated well  Goals Unmet:  Not Applicable  Comments: Service time is from 10:30a to 12:15p    Dr. Wesam G. Yacoub is Medical Director for Pulmonary Rehab at [facility]. 

## 2018-07-31 ENCOUNTER — Encounter (HOSPITAL_COMMUNITY)
Admission: RE | Admit: 2018-07-31 | Discharge: 2018-07-31 | Disposition: A | Discharge status: Home / Self Care | Payer: Medicare Other | Source: Ambulatory Visit | Attending: Emergency Medicine | Admitting: Emergency Medicine

## 2018-07-31 ENCOUNTER — Other Ambulatory Visit: Payer: Self-pay | Admitting: Internal Medicine

## 2018-07-31 DIAGNOSIS — J439 Emphysema, unspecified: Secondary | ICD-10-CM

## 2018-07-31 DIAGNOSIS — I2510 Atherosclerotic heart disease of native coronary artery without angina pectoris: Secondary | ICD-10-CM

## 2018-07-31 NOTE — Progress Notes (Signed)
Daily Session Note  Patient Details  Name: George Robbins MRN: [id number] Date of Birth: 10/12/1951 Referring Provider:     Pulmonary Rehab Walk Test from 04/29/2018 in [place]  Referring Provider  Dr. Lamonte Sakai      Encounter Date: 07/31/2018  Check In: Session Check In - 07/31/18 1137      Check-In   Supervising physician immediately available to respond to emergencies  [facility] immediately available    Physician(s)  Dr. Darrick Meigs     Location  [facility]    Staff Present  Rodney Langton, RN;Carlette Wilber Oliphant, RN, BSN;Joann Rion, RN, BSN;Ramon Dredge, RN, MHA    Medication changes reported      No    Fall or balance concerns reported     No    Tobacco Cessation  No Change    Warm-up and Cool-down  Performed as group-led instruction    Resistance Training Performed  Yes    VAD Patient?  No    PAD/SET Patient?  No      Pain Assessment   Currently in Pain?  No/denies       Capillary Blood Glucose: No results found for this or any previous visit (from the past 24 hour(s)).    Social History   Tobacco Use  Smoking Status Former Smoker  . Packs/day: 1.00  . Years: 30.00  . Pack years: 30.00  . Types: Cigarettes  . Last attempt to quit: 04/02/2004  . Years since quitting: 14.3  Smokeless Tobacco Never Used    Goals Met:  Exercise tolerated well No report of cardiac concerns or symptoms Strength training completed today  Goals Unmet:  Not Applicable  Comments: Service time is from 1030 to 1150    Dr. Rush Farmer is Medical Director for Pulmonary Rehab at [facility].

## 2018-08-01 DIAGNOSIS — R351 Nocturia: Secondary | ICD-10-CM | POA: Diagnosis not present

## 2018-08-01 DIAGNOSIS — N401 Enlarged prostate with lower urinary tract symptoms: Secondary | ICD-10-CM | POA: Diagnosis not present

## 2018-08-01 DIAGNOSIS — R3915 Urgency of urination: Secondary | ICD-10-CM | POA: Diagnosis not present

## 2018-08-01 DIAGNOSIS — R350 Frequency of micturition: Secondary | ICD-10-CM | POA: Diagnosis not present

## 2018-08-05 ENCOUNTER — Other Ambulatory Visit: Payer: Self-pay | Admitting: Internal Medicine

## 2018-08-05 ENCOUNTER — Encounter (HOSPITAL_COMMUNITY)
Admission: RE | Admit: 2018-08-05 | Discharge: 2018-08-05 | Disposition: A | Discharge status: Home / Self Care | Payer: Medicare Other | Source: Ambulatory Visit | Attending: Emergency Medicine | Admitting: Emergency Medicine

## 2018-08-05 DIAGNOSIS — N401 Enlarged prostate with lower urinary tract symptoms: Secondary | ICD-10-CM

## 2018-08-05 DIAGNOSIS — J439 Emphysema, unspecified: Secondary | ICD-10-CM

## 2018-08-05 DIAGNOSIS — R350 Frequency of micturition: Principal | ICD-10-CM

## 2018-08-07 ENCOUNTER — Encounter (HOSPITAL_COMMUNITY): Payer: Medicare Other

## 2018-08-11 ENCOUNTER — Telehealth: Payer: Self-pay | Admitting: Emergency Medicine

## 2018-08-11 NOTE — Telephone Encounter (Signed)
Called and spoke with patient, made patient aware he would need to be re-qualified for oxygen. Voiced understanding. Appointment made for 08/15/2018. Called and spoke with George Robbins at [facility]. Made aware that patient has been scheduled to be re-qualified. Voiced understanding. Nothing further needed at this time.

## 2018-08-15 ENCOUNTER — Ambulatory Visit (INDEPENDENT_AMBULATORY_CARE_PROVIDER_SITE_OTHER): Payer: Medicare Other | Admitting: *Deleted

## 2018-08-15 DIAGNOSIS — J439 Emphysema, unspecified: Secondary | ICD-10-CM | POA: Diagnosis not present

## 2018-08-19 ENCOUNTER — Telehealth: Payer: Self-pay | Admitting: Emergency Medicine

## 2018-08-20 ENCOUNTER — Encounter: Payer: Self-pay | Admitting: Emergency Medicine

## 2018-08-20 ENCOUNTER — Ambulatory Visit (INDEPENDENT_AMBULATORY_CARE_PROVIDER_SITE_OTHER): Payer: Medicare Other | Admitting: Emergency Medicine

## 2018-08-20 DIAGNOSIS — J439 Emphysema, unspecified: Secondary | ICD-10-CM | POA: Diagnosis not present

## 2018-08-20 DIAGNOSIS — J9611 Chronic respiratory failure with hypoxia: Secondary | ICD-10-CM | POA: Diagnosis not present

## 2018-08-20 DIAGNOSIS — G4733 Obstructive sleep apnea (adult) (pediatric): Secondary | ICD-10-CM

## 2018-08-20 DIAGNOSIS — I2510 Atherosclerotic heart disease of native coronary artery without angina pectoris: Secondary | ICD-10-CM

## 2018-08-20 DIAGNOSIS — Z23 Encounter for immunization: Secondary | ICD-10-CM | POA: Diagnosis not present

## 2018-08-20 NOTE — Assessment & Plan Note (Signed)
Try using nasal saline spray to keep your nares moist, avoid drying effects of your oxygen.  This should prevent epistaxis Wear your oxygen at 3 L/min pulsed when you are exerting yourself

## 2018-08-20 NOTE — Assessment & Plan Note (Signed)
Tolerating CPAP well.  We will continue.  Good clinical benefit

## 2018-08-20 NOTE — Progress Notes (Signed)
   Subjective:    Patient ID: George Robbins, male    DOB: [birth year], 67 y.o.   MRN: [id number]  Cough  Associated symptoms include shortness of breath. Pertinent negatives include no postnasal drip or rhinorrhea.    ROV 08/20/18 --George Robbins is a 67 year old man with history of tobacco, mixed obstruction and restriction on pulmonary function testing, remote traumatic pneumothorax, chronic cough in the setting of GERD and allergic rhinitis.  He also has a history of hypertension and coronary disease.  I have not seen him since 2017 but he has been seen on at least 5 occasions in our office by other since then.  Been diagnosed with obstructive sleep apnea and started on CPAP.  He is also been found to have exertional hypoxemia and is using oxygen at 3 L/min pulsed flow.  He is currently on trelegy, feels like he is benefiting from it. He is using albuterol rarely. He was in the ED end of August and was treated with pred taper for ? AE-COPD and a HA.   PNA shots UTD, will need pneumovax next year. Needs the flu shot.     He has dry nose, some bleeding from his O2.  He describes good compliance with his CPAP every night.  Feels that he is clinically benefiting, less sleepiness, more energy                                                          No flowsheet data found.   Review of Systems  HENT: Negative for postnasal drip and rhinorrhea.   Respiratory: Positive for cough and shortness of breath.        Objective:   Physical Exam  Vitals:   08/20/18 1544  BP: 130/74  Pulse: 75  SpO2: 95%  Weight: 197 lb (89.4 kg)  Height: 5\' 7"  (1.702 m)   Gen: Pleasant, well-nourished, in no distress,  normal affect  ENT: No lesions,  mouth clear,  oropharynx clear, no postnasal drip  Neck: No JVD, no stridor  Lungs: No use of accessory muscles, clear without rales or rhonchi  Cardiovascular: RRR, heart sounds normal, no M, S4+, no peripheral edema  Musculoskeletal: No deformities, no  cyanosis or clubbing  Neuro: alert, non focal  Skin: Warm, no lesions or rashes       Assessment & Plan:  COPD (chronic obstructive pulmonary disease) (HCC) Please continue Trelegy 1 inhalation daily.  Rinse and gargle after using. Use your albuterol inhaler 2 puffs up to every 4 hours if needed for shortness of breath. Your pneumonia shots are up-to-date.  You will need the Pneumovax again next year. Flu shot today. Follow with Dr Lamonte Sakai in 6 months or sooner if you have any problems  Chronic respiratory failure with hypoxia (HCC) Try using nasal saline spray to keep your nares moist, avoid drying effects of your oxygen.  This should prevent epistaxis Wear your oxygen at 3 L/min pulsed when you are exerting yourself  OSA (obstructive sleep apnea) Tolerating CPAP well.  We will continue.  Good clinical benefit  Baltazar Apo, MD, PhD 08/20/2018, 4:11 PM [facility] [phone] or if no answer [phone]

## 2018-08-20 NOTE — Assessment & Plan Note (Signed)
Please continue Trelegy 1 inhalation daily.  Rinse and gargle after using. Use your albuterol inhaler 2 puffs up to every 4 hours if needed for shortness of breath. Your pneumonia shots are up-to-date.  You will need the Pneumovax again next year. Flu shot today. Follow with Dr Lamonte Sakai in 6 months or sooner if you have any problems

## 2018-08-20 NOTE — Telephone Encounter (Signed)
Error

## 2018-08-20 NOTE — Patient Instructions (Addendum)
Please continue to use your CPAP every night. Wear your oxygen at 3 L/min pulsed when you are exerting yourself Please continue Trelegy 1 inhalation daily.  Rinse and gargle after using. Use your albuterol inhaler 2 puffs up to every 4 hours if needed for shortness of breath. Your pneumonia shots are up-to-date.  You will need the Pneumovax again next year. Flu shot today. Try using saline nasal spray to keep your nose moist and prevent bleeding due to the drying effects of your oxygen. Follow with Dr Lamonte Sakai in 6 months or sooner if you have any problems

## 2018-08-25 ENCOUNTER — Other Ambulatory Visit: Payer: Self-pay | Admitting: Internal Medicine

## 2018-09-16 ENCOUNTER — Encounter: Payer: Self-pay | Admitting: Cardiovascular Disease

## 2018-09-19 ENCOUNTER — Other Ambulatory Visit: Payer: Self-pay | Admitting: Cardiovascular Disease

## 2018-09-19 DIAGNOSIS — R350 Frequency of micturition: Secondary | ICD-10-CM | POA: Diagnosis not present

## 2018-09-19 DIAGNOSIS — R351 Nocturia: Secondary | ICD-10-CM | POA: Diagnosis not present

## 2018-09-19 DIAGNOSIS — N401 Enlarged prostate with lower urinary tract symptoms: Secondary | ICD-10-CM | POA: Diagnosis not present

## 2018-09-19 DIAGNOSIS — R3915 Urgency of urination: Secondary | ICD-10-CM | POA: Diagnosis not present

## 2018-09-20 ENCOUNTER — Other Ambulatory Visit: Payer: Self-pay | Admitting: Cardiovascular Disease

## 2018-09-23 NOTE — Telephone Encounter (Signed)
Outpatient Medication Detail    Disp Refills Start End   ezetimibe (ZETIA) 10 MG tablet 90 tablet 0 09/19/2018    Sig - Route: Take 1 tablet (10 mg total) by mouth daily. Please keep upcoming appt with Dr. Acie Fredrickson for future refills. Thank you. - Oral   Sent to pharmacy as: ezetimibe (ZETIA) 10 MG tablet   E-Prescribing Status: Receipt confirmed by pharmacy (09/19/2018 11:09 AM EDT)   Pharmacy   [id number] - [place], [place]

## 2018-09-25 ENCOUNTER — Other Ambulatory Visit: Payer: Self-pay | Admitting: Internal Medicine

## 2018-09-29 ENCOUNTER — Other Ambulatory Visit: Payer: Self-pay | Admitting: Cardiovascular Disease

## 2018-09-29 ENCOUNTER — Telehealth: Payer: Self-pay | Admitting: Internal Medicine

## 2018-09-29 NOTE — Telephone Encounter (Signed)
Patient called to get refills for the  Ezetimibe Patient would like prescription [facility] to [facility] [address]

## 2018-09-30 NOTE — Telephone Encounter (Signed)
Outpatient Medication Detail    Disp Refills Start End   ezetimibe (ZETIA) 10 MG tablet 90 tablet 0 09/19/2018    Sig - Route: Take 1 tablet (10 mg total) by mouth daily. Please keep upcoming appt with Dr. Acie Fredrickson for future refills. Thank you. - Oral   Sent to pharmacy as: ezetimibe (ZETIA) 10 MG tablet   E-Prescribing Status: Receipt confirmed by pharmacy (09/19/2018 11:09 AM EDT)   Pharmacy   [id number] - [place], [place]

## 2018-09-30 NOTE — Telephone Encounter (Signed)
Dr. Acie Fredrickson sent a 90 day RX to [facility] [address] on 09/19/18

## 2018-10-07 ENCOUNTER — Other Ambulatory Visit: Payer: Self-pay | Admitting: Cardiovascular Disease

## 2018-10-07 ENCOUNTER — Ambulatory Visit (INDEPENDENT_AMBULATORY_CARE_PROVIDER_SITE_OTHER): Payer: Medicare Other | Admitting: Cardiovascular Disease

## 2018-10-07 ENCOUNTER — Other Ambulatory Visit: Payer: Self-pay | Admitting: Acute Care

## 2018-10-07 ENCOUNTER — Encounter: Payer: Self-pay | Admitting: Cardiovascular Disease

## 2018-10-07 VITALS — BP 116/84 | HR 79 | Ht 67.0 in | Wt 195.0 lb

## 2018-10-07 DIAGNOSIS — J449 Chronic obstructive pulmonary disease, unspecified: Secondary | ICD-10-CM

## 2018-10-07 DIAGNOSIS — I2510 Atherosclerotic heart disease of native coronary artery without angina pectoris: Secondary | ICD-10-CM | POA: Diagnosis not present

## 2018-10-07 DIAGNOSIS — E782 Mixed hyperlipidemia: Secondary | ICD-10-CM | POA: Diagnosis not present

## 2018-10-07 LAB — HEPATIC FUNCTION PANEL
ALBUMIN: 5 g/dL — AB (ref 3.6–4.8)
ALT: 36 IU/L (ref 0–44)
AST: 22 IU/L (ref 0–40)
Alkaline Phosphatase: 100 IU/L (ref 39–117)
BILIRUBIN TOTAL: 0.5 mg/dL (ref 0.0–1.2)
BILIRUBIN, DIRECT: 0.15 mg/dL (ref 0.00–0.40)
Total Protein: 7.6 g/dL (ref 6.0–8.5)

## 2018-10-07 LAB — BASIC METABOLIC PANEL
BUN/Creatinine Ratio: 8 — ABNORMAL LOW (ref 10–24)
BUN: 10 mg/dL (ref 8–27)
CO2: 26 mmol/L (ref 20–29)
Calcium: 10.3 mg/dL — ABNORMAL HIGH (ref 8.6–10.2)
Chloride: 100 mmol/L (ref 96–106)
Creatinine, Ser: 1.2 mg/dL (ref 0.76–1.27)
GFR calc Af Amer: 73 mL/min/{1.73_m2} (ref >59)
GFR, EST NON AFRICAN AMERICAN: 63 mL/min/{1.73_m2} (ref >59)
Glucose: 91 mg/dL (ref 65–99)
POTASSIUM: 4.2 mmol/L (ref 3.5–5.2)
Sodium: 142 mmol/L (ref 134–144)

## 2018-10-07 LAB — LIPID PANEL
CHOLESTEROL TOTAL: 156 mg/dL (ref 100–199)
Chol/HDL Ratio: 2.4 ratio (ref 0.0–5.0)
HDL: 64 mg/dL (ref >39)
LDL Calculated: 74 mg/dL (ref 0–99)
TRIGLYCERIDES: 89 mg/dL (ref 0–149)
VLDL Cholesterol Cal: 18 mg/dL (ref 5–40)

## 2018-10-07 MED ORDER — BISOPROLOL FUMARATE 5 MG PO TABS: 5.0000 mg | ORAL_TABLET | Freq: Every day | ORAL | 11 refills | Status: DC

## 2018-10-07 MED ORDER — EZETIMIBE 10 MG PO TABS: 10.0000 mg | ORAL_TABLET | Freq: Every day | ORAL | 3 refills | Status: DC
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
## 2018-10-07 NOTE — Patient Instructions (Addendum)
Medication Instructions:  Your physician has recommended you make the following change in your medication:   STOP Carvedilol (Coreg)  START Bisoprolol (Zebeta) 5 mg once daily  If you need a refill on your cardiac medications before your next appointment, please call your pharmacy.   Lab work: TODAY - cholesterol, liver panel, basic metabolic panel  If you have labs (blood work) drawn today and your tests are completely normal, you will receive your results only by: Marland Kitchen MyChart Message (if you have MyChart) OR . A paper copy in the mail If you have any lab test that is abnormal or we need to change your treatment, we will call you to review the results.   Testing/Procedures: None Ordered   Follow-Up: At [facility], you and your health needs are our priority.  As part of our continuing mission to provide you with exceptional heart care, we have created designated Provider Care Teams.  These Care Teams include your primary Cardiologist (physician) and Advanced Practice Providers (APPs -  Physician Assistants and Nurse Practitioners) who all work together to provide you with the care you need, when you need it. You will need a follow up appointment in:  1 years.  Please call our office 2 months in advance to schedule this appointment.  You may see Mertie Moores, MD or one of the following Advanced Practice Providers on your designated Care Team: Richardson Dopp, PA-C [place], [place] . Daune Perch, NP

## 2018-10-12 NOTE — Progress Notes (Addendum)
Discharge Progress Report  Patient Details  Name: George Robbins MRN: [id number] Date of Birth: [birth year] Referring Provider:     Pulmonary Rehab Walk Test from 04/29/2018 in [place]  Referring Provider  Dr. Lamonte Sakai       Number of Visits: 24  Reason for Discharge:  Patient reached a stable level of exercise. Patient independent in their exercise.  Smoking History:  Social History   Tobacco Use  Smoking Status Former Smoker  . Packs/day: 1.00  . Years: 30.00  . Pack years: 30.00  . Types: Cigarettes  . Last attempt to quit: 04/02/2004  . Years since quitting: 14.5  Smokeless Tobacco Never Used    Diagnosis:  Pulmonary emphysema, unspecified emphysema type ([place])  ADL [facility]: Pulmonary Assessment Scores    Row Name 05/01/18 0723 05/06/18 1044 07/30/18 1417     [facility]L [facility]   ADL Phase  Entry  Entry  Exit   SOB Score total  -  33  28     CAT Score   CAT Score  -  27  23     mMRC Score   mMRC Score  1  -  -   Row Name 08/05/18 1613         ADL [facility]   ADL Phase  Exit       mMRC Score   mMRC Score  3        Initial Exercise Prescription: Initial Exercise Prescription - 05/01/18 0700      Date of Initial Exercise RX and Referring Provider   Date  05/01/18    Referring Provider  Dr. Lamonte Sakai      Oxygen   Oxygen  Continuous    Liters  2      Bike   Level  0.4    Minutes  17      NuStep   Level  2    SPM  80    Minutes  17    METs  1.5      Track   Laps  5    Minutes  17      Prescription Details   Frequency (times per week)  2    Duration  Progress to 45 minutes of aerobic exercise without signs/symptoms of physical distress      Intensity   THRR 40-80% of Max Heartrate  62-124    Ratings of Perceived Exertion  11-13    Perceived Dyspnea  0-4      Progression   Progression  Continue progressive overload as per policy without signs/symptoms or physical distress.      Resistance Training   Training  Prescription  Yes    Weight  blue bands    Reps  10-15       Discharge Exercise Prescription (Final Exercise Prescription Changes): Exercise Prescription Changes - 07/29/18 1200      Response to Exercise   Blood Pressure (Admit)  116/62    Blood Pressure (Exercise)  118/78    Blood Pressure (Exit)  118/60    Heart Rate (Admit)  74 bpm    Heart Rate (Exercise)  83 bpm    Heart Rate (Exit)  67 bpm    Oxygen Saturation (Admit)  98 %    Oxygen Saturation (Exercise)  94 %    Oxygen Saturation (Exit)  97 %    Rating of Perceived Exertion (Exercise)  15    Perceived Dyspnea (Exercise)  3  Duration  Continue with 45 min of aerobic exercise without signs/symptoms of physical distress.    Intensity  THRR unchanged      Progression   Progression  Continue to progress workloads to maintain intensity without signs/symptoms of physical distress.      Resistance Training   Training Prescription  Yes    Weight  blue bands    Reps  10-15    Time  10 Minutes      Oxygen   Oxygen  Continuous    Liters  3      Bike   Level  0.7    Minutes  17      NuStep   Level  5    SPM  80    Minutes  17    METs  2.1      Track   Laps  11    Minutes  17       Functional Capacity: 6 Minute Walk    Row Name 05/01/18 0718 08/05/18 1608       6 Minute Walk   Phase  Initial  Discharge    Distance  1143 feet  1500 feet    Distance Feet Change  -  357 ft    Walk Time  6 minutes  6 minutes    # of Rest Breaks  0  0    MPH  2.16  2.84    METS  2.61  3.14    RPE  14  11    Perceived Dyspnea   4  1    Symptoms  -  No    Resting HR  90 bpm  71 bpm    Resting BP  118/72  122/60    Resting Oxygen Saturation   90 %  97 %    Exercise Oxygen Saturation  during 6 min walk  84 %  90 %    Max Ex. HR  109 bpm  118 bpm    Max Ex. BP  124/60  130/68      Interval HR   1 Minute HR  105  113    2 Minute HR  107  113    3 Minute HR  109  108    4 Minute HR  109  102    5 Minute HR  106  110     6 Minute HR  107  118    2 Minute Post HR  102  79    Interval Heart Rate?  Yes  Yes      Interval Oxygen   Interval Oxygen?  Yes  Yes    Baseline Oxygen Saturation %  90 %  97 %    1 Minute Oxygen Saturation %  90 %  90 %    1 Minute Liters of Oxygen  0 L  3 L    2 Minute Oxygen Saturation %  85 %  90 %    2 Minute Liters of Oxygen  0 L  3 L    3 Minute Oxygen Saturation %  84 %  90 %    3 Minute Liters of Oxygen  0 L  3 L    4 Minute Oxygen Saturation %  84 %  92 %    4 Minute Liters of Oxygen  0 L  3 L    5 Minute Oxygen Saturation %  88 %  91 %    5 Minute Liters of Oxygen  2 L  3 L    6 Minute Oxygen Saturation %  89 %  91 %    6 Minute Liters of Oxygen  2 L  3 L    2 Minute Post Oxygen Saturation %  91 %  96 %    2 Minute Post Liters of Oxygen  2 L  3 L       Psychological, QOL, Others - Outcomes: PHQ 2/9: Depression screen [facility] 2/9 06/30/2018 04/25/2018 02/14/2018 05/10/2017 04/09/2017  Decreased Interest 0 0 0 0 0  Down, Depressed, Hopeless 0 1 0 0 1  PHQ - 2 Score 0 1 0 0 1  Altered sleeping - 3 0 - -  Tired, decreased energy - 1 1 - -  Change in appetite - 0 0 - -  Feeling bad or failure about yourself  - 1 0 - -  Trouble concentrating - 0 0 - -  Moving slowly or fidgety/restless - 0 0 - -  Suicidal thoughts - 0 0 - -  PHQ-9 Score - 6 1 - -  Difficult doing work/chores - Not difficult at all - - -    Quality of Life:   Personal Goals: Goals established at orientation with interventions provided to work toward goal. Personal Goals and Risk Factors at Admission - 04/25/18 1028      Core Components/Risk Factors/Patient Goals on Admission    Weight Management  Weight Loss;Yes    Intervention  Weight Management/Obesity: Establish reasonable short term and long term weight goals.;Weight Management: Provide education and appropriate resources to help participant work on and attain dietary goals.;Obesity: Provide education and appropriate resources to help participant  work on and attain dietary goals.;Weight Management: Develop a combined nutrition and exercise program designed to reach desired caloric intake, while maintaining appropriate intake of nutrient and fiber, sodium and fats, and appropriate energy expenditure required for the weight goal.    Admit Weight  196 lb 13.9 oz (89.3 kg)    Goal Weight: Short Term  185 lb (83.9 kg)    Goal Weight: Long Term  180 lb (81.6 kg)    Expected Outcomes  Short Term: Continue to assess and modify interventions until short term weight is achieved;Weight Maintenance: Understanding of the daily nutrition guidelines, which includes 25-35% calories from fat, 7% or less cal from saturated fats, less than 274m cholesterol, less than 1.5gm of sodium, & 5 or more servings of fruits and vegetables daily;Weight Loss: Understanding of general recommendations for a balanced deficit meal plan, which promotes 1-2 lb weight loss per week and includes a negative energy balance of [phone] kcal/d;Long Term: Adherence to nutrition and physical activity/exercise program aimed toward attainment of established weight goal;Understanding recommendations for meals to include 15-35% energy as protein, 25-35% energy from fat, 35-60% energy from carbohydrates, less than 2015mof dietary cholesterol, 20-35 gm of total fiber daily;Understanding of distribution of calorie intake throughout the day with the consumption of 4-5 meals/snacks    Improve shortness of breath with ADL's  Yes    Intervention  Provide education, individualized exercise plan and daily activity instruction to help decrease symptoms of SOB with activities of daily living.    Expected Outcomes  Short Term: Improve cardiorespiratory fitness to achieve a reduction of symptoms when performing ADLs;Long Term: Be able to perform more ADLs without symptoms or delay the onset of symptoms    Diabetes  --   diagnosised with "pre diabetes" takes metofrmin but does not check his blood glucoose  or  have a meter   Lipids  Yes    Intervention  Provide education and support for participant on nutrition & aerobic/resistive exercise along with prescribed medications to achieve LDL <65m, HDL >461m    Expected Outcomes  Short Term: Participant states understanding of desired cholesterol values and is compliant with medications prescribed. Participant is following exercise prescription and nutrition guidelines.;Long Term: Cholesterol controlled with medications as prescribed, with individualized exercise RX and with personalized nutrition plan. Value goals: LDL < 7062mHDL > 40 mg.        Personal Goals Discharge: Goals and Risk Factor Review    Row Name 05/26/18 1400 06/16/18 1356 06/18/18 1155 07/15/18 1714 07/16/18 0945     Core Components/Risk Factors/Patient Goals Review   Personal Goals Review  Weight Management/Obesity;Improve shortness of breath with ADL's;Heart Failure;Diabetes  Weight Management/Obesity;Improve shortness of breath with ADL's;Heart Failure;Diabetes  -  Weight Management/Obesity;Improve shortness of breath with ADL's;Develop more efficient breathing techniques such as purse lipped breathing and diaphragmatic breathing and practicing self-pacing with activity.;Increase knowledge of respiratory medications and ability to use respiratory devices properly.  -   Review  has attended 6 exercise sessions, no weight loss as of yet, heart failure and diabetes are controlled, CBG 90-114 while in program, we are not monitoring further b/c of readings at start of program.  walking 13 laps, level 3 on nustep, .4 level on bike  George Robbins attended 12 exercise sessions pt with the additon of oxygen therapy at [facility] continuous.  Pt demonstrates mangament of his oxygen appropriately.  Pt has attended oxygen saftey class.  Pt manages his heart failure by watching his sodium intake and weighing himself daily.  Pt diabetes is mangaged with low dose of metformin.  Pt A1C is 6.5 in 01/2018.  Pt does not  check his blood glucose at home.  After a couple of pre and post checks, rehab staff felt continuing to monitor was not warranted.  Pt shows weight gain from his first day he weighed 86.7  and last wight on 7/18 was 87.8.  Pt has not engaged in consisitent home exercise.  Will talk with pt to discuss barriers that are preventing him from home exercise.  Pt last workloads do show maintaing airydyne to .4; nustep increased to workload of level 4 and increase to 12 laps on the track.  -  George Robbins has attended 20 exercise sessions pt with the addition of oxygen therapy at 3lnc continuous.  Pt demonstrates management of his oxygen outside of rehab.  Pt shows weight gain from his first day he weighed 86.7  and last weight on 8/20 was 88.2  Pt does not have the motivation from his granddaughter that lives with him to encourage him to exercise.  His granddaughter is also on oxygen therapy and is dealing with her own health issues.   Pt last workloads do show maintaing airydyne .8 ,nustep increased to workload of level 5 and increase to 14 laps on the track. Would like to see pt weight decrease in the next 30 days.  -   Expected Outcomes  see admission goals  see admission goals  see admission goals/outcomes  see admission goals/outcomes  see Admission Goals/Outcomes   Row Name 10/12/18 1803             Core Components/Risk Factors/Patient Goals Review   Personal Goals Review  Weight Management/Obesity;Improve shortness of breath with ADL's;Develop more efficient breathing techniques such as purse lipped breathing and diaphragmatic breathing and  practicing self-pacing with activity.;Increase knowledge of respiratory medications and ability to use respiratory devices properly.       Review  Pt graduates with the completion of 24 exercise sessions.  Pt plans to continue home exercise with walking 2-3 days a week. Encourage the pulmonary maintenance program if he is unable to be consistent with exercise on his own.        Expected Outcomes  Pt has made progress toward goals and outcomes.          Exercise Goals and Review: Exercise Goals    Row Name 04/25/18 1036             Exercise Goals   Increase Physical Activity  Yes       Intervention  Provide advice, education, support and counseling about physical activity/exercise needs.;Develop an individualized exercise prescription for aerobic and resistive training based on initial evaluation findings, risk stratification, comorbidities and participant's personal goals.       Expected Outcomes  Short Term: Attend rehab on a regular basis to increase amount of physical activity.;Long Term: Add in home exercise to make exercise part of routine and to increase amount of physical activity.;Long Term: Exercising regularly at least 3-5 days a week.       Increase Strength and Stamina  Yes       Intervention  Provide advice, education, support and counseling about physical activity/exercise needs.;Develop an individualized exercise prescription for aerobic and resistive training based on initial evaluation findings, risk stratification, comorbidities and participant's personal goals.       Expected Outcomes  Short Term: Increase workloads from initial exercise prescription for resistance, speed, and METs.;Short Term: Perform resistance training exercises routinely during rehab and add in resistance training at home;Long Term: Improve cardiorespiratory fitness, muscular endurance and strength as measured by increased METs and functional capacity (6MWT)       Able to understand and use rate of perceived exertion (RPE) scale  Yes       Intervention  Provide education and explanation on how to use RPE scale       Expected Outcomes  Short Term: Able to use RPE daily in rehab to express subjective intensity level;Long Term:  Able to use RPE to guide intensity level when exercising independently       Able to understand and use Dyspnea scale  Yes       Intervention  Provide  education and explanation on how to use Dyspnea scale       Expected Outcomes  Short Term: Able to use Dyspnea scale daily in rehab to express subjective sense of shortness of breath during exertion;Long Term: Able to use Dyspnea scale to guide intensity level when exercising independently       Knowledge and understanding of Target Heart Rate Range (THRR)  Yes       Intervention  Provide education and explanation of THRR including how the numbers were predicted and where they are located for reference       Expected Outcomes  Short Term: Able to state/look up THRR;Long Term: Able to use THRR to govern intensity when exercising independently;Short Term: Able to use daily as guideline for intensity in rehab       Understanding of Exercise Prescription  Yes       Intervention  Provide education, explanation, and written materials on patient's individual exercise prescription       Expected Outcomes  Long Term: Able to explain home exercise prescription to exercise independently;Short Term:  Able to explain program exercise prescription          Exercise Goals Re-Evaluation: Exercise Goals Re-Evaluation    Row Name 05/23/18 1010 06/16/18 0914 07/14/18 0938 07/14/18 1357       Exercise Goal Re-Evaluation   Exercise Goals Review  Increase Physical Activity;Able to understand and use rate of perceived exertion (RPE) scale;Knowledge and understanding of Target Heart Rate Range (THRR);Understanding of Exercise Prescription;Increase Strength and Stamina;Able to understand and use Dyspnea scale;Able to check pulse independently  Increase Physical Activity;Able to understand and use rate of perceived exertion (RPE) scale;Knowledge and understanding of Target Heart Rate Range (THRR);Understanding of Exercise Prescription;Increase Strength and Stamina;Able to understand and use Dyspnea scale  Increase Physical Activity;Able to understand and use rate of perceived exertion (RPE) scale;Knowledge and understanding  of Target Heart Rate Range (THRR);Understanding of Exercise Prescription;Increase Strength and Stamina;Able to understand and use Dyspnea scale  (Pended)   Increase Physical Activity;Able to understand and use rate of perceived exertion (RPE) scale;Knowledge and understanding of Target Heart Rate Range (THRR);Understanding of Exercise Prescription;Increase Strength and Stamina;Able to understand and use Dyspnea scale    Comments  Patient is progressing slowly. Is able to walk 18 laps (200 ft each) in 15 minutes.  MET average places him in a low level. Will cont. to monitor and progress as able. Will also talk to him about home exercise prescription.   Patient is progressing slow and steady. Is able to walk 12-13 laps (200 ft each) in 15 minutes.  MET average places him in a low level. Is open to workload increases. Will cont. to monitor and progress as able. Home exercise reviewed.   Patient is progressing slow and steady. Is able to walk 12-13 laps (200 ft each) in 15 minutes.  MET average places him in a low level. Is open to workload increases. Will cont. to monitor and progress as able. Home exercise reviewed.   (Pended)   Patient is progressing slow and steady. Is able to walk 12-13 laps (200 ft each) in 15 minutes.  MET average places him in a low level. Is open to workload increases. Will cont. to monitor and progress as able. Home exercise reviewed.     Expected Outcomes  Through exercise at rehab and at home, the patient will decrease shortness of breath with daily activities and feel confident in carrying out an exercise regime at home.   Through exercise at rehab and at home, the patient will decrease shortness of breath with daily activities and feel confident in carrying out an exercise regime at home.   Through exercise at rehab and at home, the patient will decrease shortness of breath with daily activities and feel confident in carrying out an exercise regime at home.   (Pended)   Through exercise  at rehab and at home, the patient will decrease shortness of breath with daily activities and feel confident in carrying out an exercise regime at home.        Nutrition & Weight - Outcomes:    Nutrition: Nutrition Therapy & Goals - 08/29/18 0725      Nutrition Therapy   Diet  Consistent Carb, Heart Healthy      Personal Nutrition Goals   Nutrition Goal  pt to identify and limit food sources of saturated fat, trans fat, refined carbohydrates, and sodium      Intervention Plan   Intervention  Prescribe, educate and counsel regarding individualized specific dietary modifications aiming towards targeted core components such as  weight, hypertension, lipid management, diabetes, heart failure and other comorbidities.    Expected Outcomes  Short Term Goal: Understand basic principles of dietary content, such as calories, fat, sodium, cholesterol and nutrients.;Long Term Goal: Adherence to prescribed nutrition plan.       Nutrition Discharge: Nutrition Assessments - 08/01/18 1142      Rate Your Plate Scores   Pre Score  48    Post Score  30       Education Questionnaire Score: Knowledge Questionnaire Score - 07/30/18 1417      Knowledge Questionnaire Score   Post Score  13/18       Goals reviewed with patient. Cherre Huger, BSN Cardiac and Training and development officer

## 2018-10-12 NOTE — Addendum Note (Signed)
Encounter addended by: Rowe Pavy, RN on: 10/12/2018 6:21 PM  Actions taken: Episode resolved, Flowsheet data copied forward, Visit Navigator Flowsheet section accepted, Sign clinical note

## 2018-10-24 ENCOUNTER — Other Ambulatory Visit: Payer: Self-pay | Admitting: Internal Medicine

## 2018-10-24 DIAGNOSIS — I2510 Atherosclerotic heart disease of native coronary artery without angina pectoris: Secondary | ICD-10-CM

## 2018-10-30 ENCOUNTER — Encounter: Payer: Self-pay | Admitting: Internal Medicine

## 2018-10-30 ENCOUNTER — Ambulatory Visit: Payer: Medicare Other | Attending: Internal Medicine | Admitting: Internal Medicine

## 2018-10-30 VITALS — BP 120/80 | HR 79 | Temp 97.6°F | Wt 197.0 lb

## 2018-10-30 DIAGNOSIS — Z9981 Dependence on supplemental oxygen: Secondary | ICD-10-CM | POA: Insufficient documentation

## 2018-10-30 DIAGNOSIS — E039 Hypothyroidism, unspecified: Secondary | ICD-10-CM | POA: Diagnosis not present

## 2018-10-30 DIAGNOSIS — Z9989 Dependence on other enabling machines and devices: Secondary | ICD-10-CM | POA: Diagnosis not present

## 2018-10-30 DIAGNOSIS — Z79899 Other long term (current) drug therapy: Secondary | ICD-10-CM | POA: Diagnosis not present

## 2018-10-30 DIAGNOSIS — J439 Emphysema, unspecified: Secondary | ICD-10-CM | POA: Diagnosis not present

## 2018-10-30 DIAGNOSIS — I10 Essential (primary) hypertension: Secondary | ICD-10-CM

## 2018-10-30 DIAGNOSIS — N400 Enlarged prostate without lower urinary tract symptoms: Secondary | ICD-10-CM

## 2018-10-30 DIAGNOSIS — Z7902 Long term (current) use of antithrombotics/antiplatelets: Secondary | ICD-10-CM | POA: Diagnosis not present

## 2018-10-30 DIAGNOSIS — R7303 Prediabetes: Secondary | ICD-10-CM | POA: Insufficient documentation

## 2018-10-30 DIAGNOSIS — Z87891 Personal history of nicotine dependence: Secondary | ICD-10-CM | POA: Diagnosis not present

## 2018-10-30 DIAGNOSIS — J9611 Chronic respiratory failure with hypoxia: Secondary | ICD-10-CM | POA: Diagnosis not present

## 2018-10-30 DIAGNOSIS — E669 Obesity, unspecified: Secondary | ICD-10-CM | POA: Insufficient documentation

## 2018-10-30 DIAGNOSIS — N401 Enlarged prostate with lower urinary tract symptoms: Secondary | ICD-10-CM | POA: Insufficient documentation

## 2018-10-30 DIAGNOSIS — K219 Gastro-esophageal reflux disease without esophagitis: Secondary | ICD-10-CM | POA: Insufficient documentation

## 2018-10-30 DIAGNOSIS — Z79890 Hormone replacement therapy (postmenopausal): Secondary | ICD-10-CM | POA: Insufficient documentation

## 2018-10-30 DIAGNOSIS — N182 Chronic kidney disease, stage 2 (mild): Secondary | ICD-10-CM | POA: Diagnosis not present

## 2018-10-30 DIAGNOSIS — Z7982 Long term (current) use of aspirin: Secondary | ICD-10-CM | POA: Diagnosis not present

## 2018-10-30 DIAGNOSIS — Z888 Allergy status to other drugs, medicaments and biological substances status: Secondary | ICD-10-CM | POA: Insufficient documentation

## 2018-10-30 DIAGNOSIS — I129 Hypertensive chronic kidney disease with stage 1 through stage 4 chronic kidney disease, or unspecified chronic kidney disease: Secondary | ICD-10-CM | POA: Diagnosis not present

## 2018-10-30 DIAGNOSIS — Z833 Family history of diabetes mellitus: Secondary | ICD-10-CM | POA: Insufficient documentation

## 2018-10-30 DIAGNOSIS — Z6830 Body mass index (BMI) 30.0-30.9, adult: Secondary | ICD-10-CM | POA: Insufficient documentation

## 2018-10-30 DIAGNOSIS — Z8249 Family history of ischemic heart disease and other diseases of the circulatory system: Secondary | ICD-10-CM | POA: Insufficient documentation

## 2018-10-30 DIAGNOSIS — G8929 Other chronic pain: Secondary | ICD-10-CM | POA: Diagnosis not present

## 2018-10-30 DIAGNOSIS — G4733 Obstructive sleep apnea (adult) (pediatric): Secondary | ICD-10-CM | POA: Insufficient documentation

## 2018-10-30 DIAGNOSIS — Z7984 Long term (current) use of oral hypoglycemic drugs: Secondary | ICD-10-CM | POA: Diagnosis not present

## 2018-10-30 DIAGNOSIS — I25119 Atherosclerotic heart disease of native coronary artery with unspecified angina pectoris: Secondary | ICD-10-CM | POA: Diagnosis not present

## 2018-10-30 DIAGNOSIS — M545 Low back pain: Secondary | ICD-10-CM | POA: Diagnosis not present

## 2018-10-30 DIAGNOSIS — Z800 Family history of malignant neoplasm of digestive organs: Secondary | ICD-10-CM | POA: Insufficient documentation

## 2018-10-30 DIAGNOSIS — E785 Hyperlipidemia, unspecified: Secondary | ICD-10-CM | POA: Diagnosis not present

## 2018-10-30 DIAGNOSIS — Z955 Presence of coronary angioplasty implant and graft: Secondary | ICD-10-CM | POA: Insufficient documentation

## 2018-10-30 LAB — POCT GLYCOSYLATED HEMOGLOBIN (HGB A1C): Hemoglobin A1C: 5.7 % — AB (ref 4.0–5.6)

## 2018-10-30 LAB — GLUCOSE, POCT (MANUAL RESULT ENTRY): POC GLUCOSE: 94 mg/dL (ref 70–99)
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
## 2018-10-31 ENCOUNTER — Other Ambulatory Visit: Payer: Self-pay | Admitting: Cardiovascular Disease

## 2018-10-31 LAB — TSH: TSH: 2.58 u[IU]/mL (ref 0.450–4.500)

## 2018-11-06 ENCOUNTER — Other Ambulatory Visit: Payer: Self-pay | Admitting: Internal Medicine

## 2018-11-06 ENCOUNTER — Telehealth: Payer: Self-pay

## 2018-11-06 DIAGNOSIS — R350 Frequency of micturition: Principal | ICD-10-CM

## 2018-11-06 DIAGNOSIS — N401 Enlarged prostate with lower urinary tract symptoms: Secondary | ICD-10-CM

## 2018-11-06 NOTE — Telephone Encounter (Signed)
Contacted pt to go over lab results pt is aware and doesn't have any questions or concerns 

## 2018-11-09 ENCOUNTER — Other Ambulatory Visit: Payer: Self-pay | Admitting: Internal Medicine

## 2018-11-09 ENCOUNTER — Other Ambulatory Visit: Payer: Self-pay | Admitting: Cardiovascular Disease

## 2018-12-08 ENCOUNTER — Other Ambulatory Visit: Payer: Self-pay | Admitting: Internal Medicine

## 2018-12-19 DIAGNOSIS — R351 Nocturia: Secondary | ICD-10-CM | POA: Diagnosis not present

## 2018-12-19 DIAGNOSIS — R3915 Urgency of urination: Secondary | ICD-10-CM | POA: Diagnosis not present

## 2018-12-19 DIAGNOSIS — N401 Enlarged prostate with lower urinary tract symptoms: Secondary | ICD-10-CM | POA: Diagnosis not present

## 2018-12-20 ENCOUNTER — Other Ambulatory Visit: Payer: Self-pay | Admitting: Internal Medicine

## 2019-01-06 ENCOUNTER — Telehealth: Payer: Self-pay | Admitting: Internal Medicine

## 2019-01-06 NOTE — Telephone Encounter (Signed)
1) Medication(s) Requested (by name): metFORMIN (GLUCOPHAGE) 500 MG tablet [[id number]]   Patient says he lost his medication bottle and needs a refill for it/authorization for pharmacy. Please follow up. 2) [facility] [address]

## 2019-01-06 NOTE — Telephone Encounter (Signed)
[facility]armac[facility] was [facility] to [facility] the patients metformin RX, they will notify the patient when it is ready.

## 2019-01-19 ENCOUNTER — Other Ambulatory Visit: Payer: Self-pay | Admitting: Internal Medicine

## 2019-01-29 ENCOUNTER — Other Ambulatory Visit: Payer: Self-pay | Admitting: Internal Medicine

## 2019-01-29 DIAGNOSIS — R350 Frequency of micturition: Principal | ICD-10-CM

## 2019-01-29 DIAGNOSIS — N401 Enlarged prostate with lower urinary tract symptoms: Secondary | ICD-10-CM

## 2019-01-30 ENCOUNTER — Ambulatory Visit: Payer: Medicare Other | Attending: Internal Medicine | Admitting: Internal Medicine

## 2019-01-30 VITALS — BP 146/77 | HR 80 | Temp 97.9°F | Resp 16 | Wt 205.8 lb

## 2019-01-30 DIAGNOSIS — N401 Enlarged prostate with lower urinary tract symptoms: Secondary | ICD-10-CM | POA: Diagnosis not present

## 2019-01-30 DIAGNOSIS — Z7982 Long term (current) use of aspirin: Secondary | ICD-10-CM | POA: Diagnosis not present

## 2019-01-30 DIAGNOSIS — J9611 Chronic respiratory failure with hypoxia: Secondary | ICD-10-CM | POA: Diagnosis not present

## 2019-01-30 DIAGNOSIS — Z955 Presence of coronary angioplasty implant and graft: Secondary | ICD-10-CM | POA: Insufficient documentation

## 2019-01-30 DIAGNOSIS — Z7902 Long term (current) use of antithrombotics/antiplatelets: Secondary | ICD-10-CM | POA: Insufficient documentation

## 2019-01-30 DIAGNOSIS — E669 Obesity, unspecified: Secondary | ICD-10-CM

## 2019-01-30 DIAGNOSIS — E039 Hypothyroidism, unspecified: Secondary | ICD-10-CM | POA: Diagnosis not present

## 2019-01-30 DIAGNOSIS — Z6835 Body mass index (BMI) 35.0-35.9, adult: Secondary | ICD-10-CM | POA: Insufficient documentation

## 2019-01-30 DIAGNOSIS — Z79899 Other long term (current) drug therapy: Secondary | ICD-10-CM | POA: Insufficient documentation

## 2019-01-30 DIAGNOSIS — Z79890 Hormone replacement therapy (postmenopausal): Secondary | ICD-10-CM | POA: Insufficient documentation

## 2019-01-30 DIAGNOSIS — Z888 Allergy status to other drugs, medicaments and biological substances status: Secondary | ICD-10-CM | POA: Diagnosis not present

## 2019-01-30 DIAGNOSIS — Z8249 Family history of ischemic heart disease and other diseases of the circulatory system: Secondary | ICD-10-CM | POA: Diagnosis not present

## 2019-01-30 DIAGNOSIS — Z833 Family history of diabetes mellitus: Secondary | ICD-10-CM | POA: Diagnosis not present

## 2019-01-30 DIAGNOSIS — K219 Gastro-esophageal reflux disease without esophagitis: Secondary | ICD-10-CM | POA: Insufficient documentation

## 2019-01-30 DIAGNOSIS — N182 Chronic kidney disease, stage 2 (mild): Secondary | ICD-10-CM | POA: Insufficient documentation

## 2019-01-30 DIAGNOSIS — R7303 Prediabetes: Secondary | ICD-10-CM | POA: Insufficient documentation

## 2019-01-30 DIAGNOSIS — Z6832 Body mass index (BMI) 32.0-32.9, adult: Secondary | ICD-10-CM | POA: Diagnosis not present

## 2019-01-30 DIAGNOSIS — G4733 Obstructive sleep apnea (adult) (pediatric): Secondary | ICD-10-CM | POA: Diagnosis not present

## 2019-01-30 DIAGNOSIS — I129 Hypertensive chronic kidney disease with stage 1 through stage 4 chronic kidney disease, or unspecified chronic kidney disease: Secondary | ICD-10-CM | POA: Insufficient documentation

## 2019-01-30 DIAGNOSIS — Z87891 Personal history of nicotine dependence: Secondary | ICD-10-CM | POA: Diagnosis not present

## 2019-01-30 DIAGNOSIS — Z791 Long term (current) use of non-steroidal anti-inflammatories (NSAID): Secondary | ICD-10-CM | POA: Diagnosis not present

## 2019-01-30 DIAGNOSIS — Z9981 Dependence on supplemental oxygen: Secondary | ICD-10-CM | POA: Insufficient documentation

## 2019-01-30 DIAGNOSIS — I25119 Atherosclerotic heart disease of native coronary artery with unspecified angina pectoris: Secondary | ICD-10-CM | POA: Insufficient documentation

## 2019-01-30 DIAGNOSIS — Z7984 Long term (current) use of oral hypoglycemic drugs: Secondary | ICD-10-CM | POA: Diagnosis not present

## 2019-01-30 DIAGNOSIS — Z800 Family history of malignant neoplasm of digestive organs: Secondary | ICD-10-CM | POA: Diagnosis not present

## 2019-01-30 DIAGNOSIS — J438 Other emphysema: Secondary | ICD-10-CM | POA: Insufficient documentation

## 2019-01-30 DIAGNOSIS — I10 Essential (primary) hypertension: Secondary | ICD-10-CM

## 2019-01-30 DIAGNOSIS — J439 Emphysema, unspecified: Secondary | ICD-10-CM | POA: Diagnosis not present

## 2019-01-30 DIAGNOSIS — E785 Hyperlipidemia, unspecified: Secondary | ICD-10-CM | POA: Insufficient documentation

## 2019-01-30 LAB — GLUCOSE, POCT (MANUAL RESULT ENTRY): POC Glucose: 104 mg/dl — AB (ref 70–99)

## 2019-01-30 MED ORDER — VENTOLIN HFA 108 (90 BASE) MCG/ACT IN AERS: 2.0000 | INHALATION_SPRAY | Freq: Four times a day (QID) | RESPIRATORY_TRACT | 11 refills | Status: DC | PRN

## 2019-01-30 NOTE — Patient Instructions (Signed)
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
## 2019-01-31 LAB — MICROALBUMIN / CREATININE URINE RATIO
Creatinine, Urine: 106.3 mg/dL
MICROALBUM., U, RANDOM: 33.4 ug/mL
Microalb/Creat Ratio: 31 mg/g creat — ABNORMAL HIGH (ref 0–29)

## 2019-02-21 ENCOUNTER — Other Ambulatory Visit: Payer: Self-pay | Admitting: Internal Medicine

## 2019-02-21 DIAGNOSIS — I2510 Atherosclerotic heart disease of native coronary artery without angina pectoris: Secondary | ICD-10-CM

## 2019-03-13 ENCOUNTER — Other Ambulatory Visit: Payer: Self-pay | Admitting: Emergency Medicine

## 2019-03-13 ENCOUNTER — Other Ambulatory Visit: Payer: Self-pay | Admitting: Internal Medicine

## 2019-03-21 ENCOUNTER — Other Ambulatory Visit: Payer: Self-pay | Admitting: Internal Medicine

## 2019-04-04 ENCOUNTER — Other Ambulatory Visit: Payer: Self-pay | Admitting: Internal Medicine

## 2019-04-06 NOTE — Telephone Encounter (Signed)
1) Medication(s) Requested (by name): cetirizine (ZYRTEC) 10 MG tablet  levothyroxine (SYNTHROID, LEVOTHROID) 125 MCG tablet   2) [facility] [address]  3) Special Requests:   Approved medications will be sent to the pharmacy, we will reach out if there is an issue.  Requests made after 3pm may not be addressed until the following business day!  If a patient is unsure of the name of the medication(s) please note and ask patient to call back when they are able to provide all info, do not send to responsible party until all information is available!

## 2019-04-24 ENCOUNTER — Other Ambulatory Visit: Payer: Self-pay

## 2019-05-05 ENCOUNTER — Other Ambulatory Visit: Payer: Self-pay

## 2019-05-05 ENCOUNTER — Ambulatory Visit: Payer: Medicare Other | Attending: Internal Medicine | Admitting: Internal Medicine

## 2019-05-05 ENCOUNTER — Encounter: Payer: Self-pay | Admitting: Internal Medicine

## 2019-05-05 DIAGNOSIS — I2510 Atherosclerotic heart disease of native coronary artery without angina pectoris: Secondary | ICD-10-CM

## 2019-05-05 DIAGNOSIS — Z9989 Dependence on other enabling machines and devices: Secondary | ICD-10-CM

## 2019-05-05 DIAGNOSIS — G4733 Obstructive sleep apnea (adult) (pediatric): Secondary | ICD-10-CM

## 2019-05-05 DIAGNOSIS — E039 Hypothyroidism, unspecified: Secondary | ICD-10-CM

## 2019-05-05 DIAGNOSIS — J9611 Chronic respiratory failure with hypoxia: Secondary | ICD-10-CM

## 2019-05-05 DIAGNOSIS — R7303 Prediabetes: Secondary | ICD-10-CM | POA: Diagnosis not present

## 2019-05-05 DIAGNOSIS — E669 Obesity, unspecified: Secondary | ICD-10-CM

## 2019-05-05 DIAGNOSIS — I10 Essential (primary) hypertension: Secondary | ICD-10-CM | POA: Diagnosis not present

## 2019-05-05 DIAGNOSIS — J439 Emphysema, unspecified: Secondary | ICD-10-CM

## 2019-05-05 NOTE — Progress Notes (Signed)
Virtual Visit via Telephone Note Due to current restrictions/limitations of in-office visits due to the COVID-19 pandemic, this scheduled clinical appointment was converted to a telehealth visit  I connected with George Robbins on 05/05/19 at 1:45 p.m EDT by telephone and verified that I am speaking with the correct person using two identifiers. I am in my office.  The patient is at home.  Only the patient and myself participated in this encounter.  I discussed the limitations, risks, security and privacy concerns of performing an evaluation and management service by telephone and the availability of in person appointments. I also discussed with the patient that there may be a patient responsible charge related to this service. The patient expressed understanding and agreed to proceed.   History of Present Illness: Pt with hx ofcad,HL,htn, gerd, hypothyroidism,CKD, COPD, pre-DM, OSAon CPAP, chronic resp failure on continuous O2 3 Lt.patient was last evaluated 01/2019.   Pre-DM:  Adds Sweet&Low to his tea now instead of regular sweet tea. Eating more fruits and veggies.  Not getting out much due to [place].  He reports no major weight changes.  Last BMI was 32.  Tolerating metformin.  CAD/HTN:  Occasional CP and HA.  No inc SOB.  No LE edema.  No recent use of SL Nitro -limits salt in foods Reports compliance with all medications.  No device to check blood pressure.  COPD/OSA: no recent flares.  Reports compliance with Trilogy inhaler No inc cough; no sore throat or fever Using his CPAP at night and compliant with oxygen use.  Thyroid:  Compliant with Levothyroxine.  Denies any palpitations or significant weight changes.  Denies any feeling of being hot or cold all the time.  Current Outpatient Medications on File Prior to Visit  Medication Sig Dispense Refill  . acetaminophen (TYLENOL) 500 MG tablet Take 650 mg by mouth at bedtime. Takes 2 tablets of arthritis brand tylenol at  bedtime    . aspirin EC 81 MG tablet Take 1 tablet (81 mg total) by mouth daily. 30 tablet 2  . atorvastatin (LIPITOR) 40 MG tablet TAKE 1 TABLET (40 MG TOTAL) BY MOUTH DAILY AT 6 PM. 90 tablet 0  . bisoprolol (ZEBETA) 5 MG tablet Take 1 tablet (5 mg total) by mouth daily. 30 tablet 11  . cetirizine (ZYRTEC) 10 MG tablet TAKE 1 TABLET BY MOUTH EVERY DAY 90 tablet 0  . clopidogrel (PLAVIX) 75 MG tablet TAKE 1 TABLET BY MOUTH ONCE DAILY WITH BREAKFAST 90 tablet 2  . diclofenac sodium (VOLTAREN) 1 % GEL Apply 2 g topically 4 (four) times daily. 100 g 0  . ezetimibe (ZETIA) 10 MG tablet Take 1 tablet (10 mg total) by mouth daily. 90 tablet 3  . fluticasone (FLONASE) 50 MCG/ACT nasal spray INSTILL 2 SPRAYS INTO EACH NOSTRIL ONCE DAILY 16 g 2  . Fluticasone-Umeclidin-Vilant (TRELEGY ELLIPTA) 100-62.5-25 MCG/INH AEPB Inhale 1 puff into the lungs daily. 60 each 0  . INCRUSE ELLIPTA 62.5 MCG/INH AEPB INHALE 1 PUFF ONCE DAILY 30 each 2  . levothyroxine (SYNTHROID) 125 MCG tablet TAKE 1 TABLET (125 MCG TOTAL) BY MOUTH DAILY BEFORE BREAKFAST. 90 tablet 0  . metFORMIN (GLUCOPHAGE) 500 MG tablet TAKE 1/2 TABLET EVERY DAY WITH BREAKFAST 45 tablet 0  . Multiple Vitamin (MULTIVITAMIN WITH MINERALS) TABS tablet Take 1 tablet by mouth daily.    . naphazoline-glycerin (CLEAR EYES) 0.012-0.2 % SOLN Place 1-2 drops into both eyes every morning.    . nitroGLYCERIN (NITROSTAT) 0.4 MG SL tablet Place  1 tablet (0.4 mg total) under the tongue every 5 (five) minutes as needed for chest pain. 30 tablet 1  . olopatadine (PATANOL) 0.1 % ophthalmic solution PLACE 1 DROP INTO BOTH EYES DAILY AS NEEDED FOR ALLERGIES. 15 mL 0  . pantoprazole (PROTONIX) 40 MG tablet TAKE 1 TABLET BY MOUTH EVERY DAY 90 tablet 3  . tamsulosin (FLOMAX) 0.4 MG CAPS capsule TAKE 1 CAPSULE BY MOUTH EVERY DAY 90 capsule 0  . TOVIAZ 8 MG TB24 tablet Take 8 mg by mouth daily.  6  . TRELEGY ELLIPTA 100-62.5-25 MCG/INH AEPB INHALE 1 PUFF BY MOUTH EVERY DAY 60  each 3  . VENTOLIN HFA 108 (90 Base) MCG/ACT inhaler Inhale 2 puffs into the lungs every 6 (six) hours as needed for wheezing or shortness of breath. 1 Inhaler 11   No current facility-administered medications on file prior to visit.      Observations/Objective: Lab Results  Component Value Date   CHOL 156 10/07/2018   HDL 64 10/07/2018   LDLCALC 74 10/07/2018   TRIG 89 10/07/2018   CHOLHDL 2.4 10/07/2018     Chemistry      Component Value Date/Time   NA 142 10/07/2018 1035   K 4.2 10/07/2018 1035   CL 100 10/07/2018 1035   CO2 26 10/07/2018 1035   BUN 10 10/07/2018 1035   CREATININE 1.20 10/07/2018 1035   CREATININE 1.56 (H) 06/19/2016 1530      Component Value Date/Time   CALCIUM 10.3 (H) 10/07/2018 1035   ALKPHOS 100 10/07/2018 1035   AST 22 10/07/2018 1035   ALT 36 10/07/2018 1035   BILITOT 0.5 10/07/2018 1035      Assessment and Plan: 1. Essential hypertension Level of control unknown as patient does not have a home blood pressure monitoring device.  However blood pressure readings in the office in the past have been good. Continue low-salt diet and bisoprolol  2. Prediabetes 3. Obesity (BMI 30-39.9) Commended him on dietary changes.  Continue metformin.  4. Hypothyroidism, unspecified type Continue levothyroxine.  Will check TSH on next office visit  5. Chronic respiratory failure with hypoxia (HCC) Stable on O2  6. Pulmonary emphysema, unspecified emphysema type ([place]) Clinically stable.  He will continue his inhalers as prescribed  7. Coronary artery disease involving native coronary artery of native heart without angina pectoris Clinically stable.  Continue Zetia, Plavix, Lipitor, aspirin and bisoprolol.  8. OSA on CPAP Encourage continued compliance with CPAP   Follow Up Instructions: Follow-up in 3 months   I discussed the assessment and treatment plan with the patient. The patient was provided an opportunity to ask questions and all were  answered. The patient agreed with the plan and demonstrated an understanding of the instructions.   The patient was advised to call back or seek an in-person evaluation if the symptoms worsen or if the condition fails to improve as anticipated.  I provided  7 minutes of non-face-to-face time during this encounter.   Karle Plumber, MD

## 2019-05-13 ENCOUNTER — Encounter: Payer: Self-pay | Admitting: Internal Medicine

## 2019-05-20 ENCOUNTER — Other Ambulatory Visit: Payer: Self-pay | Admitting: Internal Medicine

## 2019-05-20 DIAGNOSIS — I2510 Atherosclerotic heart disease of native coronary artery without angina pectoris: Secondary | ICD-10-CM

## 2019-06-01 ENCOUNTER — Other Ambulatory Visit: Payer: Self-pay | Admitting: Internal Medicine

## 2019-06-01 DIAGNOSIS — N401 Enlarged prostate with lower urinary tract symptoms: Secondary | ICD-10-CM

## 2019-06-12 ENCOUNTER — Other Ambulatory Visit: Payer: Self-pay | Admitting: Internal Medicine

## 2019-06-15 ENCOUNTER — Ambulatory Visit: Payer: Medicare Other | Admitting: Nurse Practitioner
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
## 2019-06-16 ENCOUNTER — Ambulatory Visit (INDEPENDENT_AMBULATORY_CARE_PROVIDER_SITE_OTHER): Payer: Medicare Other | Admitting: Nurse Practitioner

## 2019-06-16 ENCOUNTER — Encounter: Payer: Self-pay | Admitting: Nurse Practitioner

## 2019-06-16 ENCOUNTER — Other Ambulatory Visit: Payer: Self-pay

## 2019-06-16 DIAGNOSIS — J439 Emphysema, unspecified: Secondary | ICD-10-CM | POA: Diagnosis not present

## 2019-06-16 NOTE — Patient Instructions (Signed)
COPD (chronic obstructive pulmonary disease) ([place]) Please continue Trelegy 1 inhalation daily.  Rinse and gargle after using. Use your albuterol inhaler 2 puffs up to every 4 hours if needed for shortness of breath.  Chronic respiratory failure with hypoxia (HCC) Continue nasal saline spray to keep your nares moist, avoid drying effects of your oxygen.  This should prevent epistaxis Wear your oxygen at 3 L/min pulsed when you are exerting yourself  OSA (obstructive sleep apnea) Patient continues to benefit from CPAP with good compliance and control documented Continue CPAP at current settings Continue current medications Goal of 4 hours or more usage per night Maintain healthy weight Do not drive if drowsy  Follow up: Follow up with Dr. Lamonte Sakai in 6 months or sooner if needed
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
## 2019-06-17 ENCOUNTER — Encounter: Payer: Self-pay | Admitting: Nurse Practitioner

## 2019-06-17 NOTE — Assessment & Plan Note (Signed)
Patient presents today for a follow-up visit.  He states that this is been a stable interval for him.  He states that he does need to requalify for oxygen today.  Patient states that he is compliant with Trelegy and uses albuterol as needed.  Patient currently uses 3 L of oxygen continuously.  Patient uses CPAP at night.  He states that he does benefit from use and wears it nightly.  Patient Instructions  COPD (chronic obstructive pulmonary disease) ([place]) Please continue Trelegy 1 inhalation daily.  Rinse and gargle after using. Use your albuterol inhaler 2 puffs up to every 4 hours if needed for shortness of breath.  Chronic respiratory failure with hypoxia (HCC) Continue nasal saline spray to keep your nares moist, avoid drying effects of your oxygen.  This should prevent epistaxis Wear your oxygen at 3 L/min pulsed when you are exerting yourself  OSA (obstructive sleep apnea) Patient continues to benefit from CPAP with good compliance and control documented Continue CPAP at current settings Continue current medications Goal of 4 hours or more usage per night Maintain healthy weight Do not drive if drowsy  Follow up: Follow up with Dr. Lamonte Sakai in 6 months or sooner if needed

## 2019-06-18 ENCOUNTER — Other Ambulatory Visit: Payer: Self-pay | Admitting: Internal Medicine

## 2019-07-03 ENCOUNTER — Other Ambulatory Visit: Payer: Self-pay | Admitting: Internal Medicine

## 2019-07-07 ENCOUNTER — Other Ambulatory Visit: Payer: Self-pay | Admitting: Internal Medicine

## 2019-08-07 ENCOUNTER — Other Ambulatory Visit: Payer: Self-pay | Admitting: Internal Medicine

## 2019-08-15 ENCOUNTER — Other Ambulatory Visit: Payer: Self-pay | Admitting: Internal Medicine

## 2019-08-15 DIAGNOSIS — I2510 Atherosclerotic heart disease of native coronary artery without angina pectoris: Secondary | ICD-10-CM

## 2019-08-18 ENCOUNTER — Ambulatory Visit: Payer: Medicare Other | Admitting: Internal Medicine

## 2019-08-26 ENCOUNTER — Other Ambulatory Visit: Payer: Self-pay | Admitting: Internal Medicine

## 2019-08-26 DIAGNOSIS — R350 Frequency of micturition: Secondary | ICD-10-CM

## 2019-08-26 DIAGNOSIS — N401 Enlarged prostate with lower urinary tract symptoms: Secondary | ICD-10-CM

## 2019-08-31 ENCOUNTER — Ambulatory Visit: Payer: Medicare Other | Admitting: Internal Medicine

## 2019-09-15 DIAGNOSIS — Z23 Encounter for immunization: Secondary | ICD-10-CM | POA: Diagnosis not present

## 2019-09-16 ENCOUNTER — Other Ambulatory Visit: Payer: Self-pay | Admitting: Internal Medicine

## 2019-09-23 ENCOUNTER — Other Ambulatory Visit: Payer: Self-pay | Admitting: Cardiovascular Disease

## 2019-09-25 ENCOUNTER — Other Ambulatory Visit: Payer: Self-pay | Admitting: Cardiovascular Disease

## 2019-09-27 ENCOUNTER — Other Ambulatory Visit: Payer: Self-pay | Admitting: Internal Medicine

## 2019-10-01 ENCOUNTER — Other Ambulatory Visit: Payer: Self-pay | Admitting: Internal Medicine

## 2019-10-05 ENCOUNTER — Other Ambulatory Visit: Payer: Self-pay

## 2019-10-05 ENCOUNTER — Ambulatory Visit: Payer: Medicare Other | Attending: Family Medicine | Admitting: Family Medicine

## 2019-10-05 DIAGNOSIS — R7303 Prediabetes: Secondary | ICD-10-CM | POA: Diagnosis not present

## 2019-10-05 DIAGNOSIS — J449 Chronic obstructive pulmonary disease, unspecified: Secondary | ICD-10-CM

## 2019-10-05 DIAGNOSIS — G4733 Obstructive sleep apnea (adult) (pediatric): Secondary | ICD-10-CM | POA: Diagnosis not present

## 2019-10-05 DIAGNOSIS — E039 Hypothyroidism, unspecified: Secondary | ICD-10-CM | POA: Diagnosis not present

## 2019-10-05 DIAGNOSIS — J9611 Chronic respiratory failure with hypoxia: Secondary | ICD-10-CM | POA: Diagnosis not present

## 2019-10-05 DIAGNOSIS — N401 Enlarged prostate with lower urinary tract symptoms: Secondary | ICD-10-CM | POA: Diagnosis not present

## 2019-10-05 DIAGNOSIS — R350 Frequency of micturition: Secondary | ICD-10-CM | POA: Diagnosis not present

## 2019-10-05 DIAGNOSIS — I2510 Atherosclerotic heart disease of native coronary artery without angina pectoris: Secondary | ICD-10-CM | POA: Diagnosis not present

## 2019-10-05 MED ORDER — ATORVASTATIN CALCIUM 40 MG PO TABS: ORAL_TABLET | ORAL | 1 refills | Status: DC

## 2019-10-05 MED ORDER — METFORMIN HCL 500 MG PO TABS: ORAL_TABLET | ORAL | 6 refills | Status: DC

## 2019-10-05 MED ORDER — TAMSULOSIN HCL 0.4 MG PO CAPS: 0.4000 mg | ORAL_CAPSULE | Freq: Every day | ORAL | 1 refills | Status: DC

## 2019-10-05 NOTE — Progress Notes (Signed)
Patient has been called and DOB has been verified. Patient has been screened and transferred to PCP to start phone visit.     
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
## 2019-10-12 ENCOUNTER — Other Ambulatory Visit: Payer: Self-pay

## 2019-10-12 ENCOUNTER — Ambulatory Visit: Payer: Medicare Other | Attending: Internal Medicine

## 2019-10-12 DIAGNOSIS — R7303 Prediabetes: Secondary | ICD-10-CM | POA: Diagnosis not present

## 2019-10-12 DIAGNOSIS — E039 Hypothyroidism, unspecified: Secondary | ICD-10-CM | POA: Diagnosis not present

## 2019-10-12 DIAGNOSIS — I2510 Atherosclerotic heart disease of native coronary artery without angina pectoris: Secondary | ICD-10-CM

## 2019-10-13 ENCOUNTER — Other Ambulatory Visit: Payer: Self-pay | Admitting: Family Medicine

## 2019-10-13 LAB — BASIC METABOLIC PANEL
BUN/Creatinine Ratio: 15 (ref 10–24)
BUN: 18 mg/dL (ref 8–27)
CO2: 26 mmol/L (ref 20–29)
Calcium: 10.7 mg/dL — ABNORMAL HIGH (ref 8.6–10.2)
Chloride: 99 mmol/L (ref 96–106)
Creatinine, Ser: 1.22 mg/dL (ref 0.76–1.27)
GFR calc Af Amer: 71 mL/min/{1.73_m2} (ref >59)
GFR calc non Af Amer: 61 mL/min/{1.73_m2} (ref >59)
Glucose: 99 mg/dL (ref 65–99)
Potassium: 4.3 mmol/L (ref 3.5–5.2)
Sodium: 141 mmol/L (ref 134–144)

## 2019-10-13 LAB — HEMOGLOBIN A1C

## 2019-10-13 LAB — LIPID PANEL
Chol/HDL Ratio: 2.9 ratio (ref 0.0–5.0)
Cholesterol, Total: 146 mg/dL (ref 100–199)
HDL: 50 mg/dL (ref >39)
LDL Chol Calc (NIH): 74 mg/dL (ref 0–99)
Triglycerides: 122 mg/dL (ref 0–149)
VLDL Cholesterol Cal: 22 mg/dL (ref 5–40)

## 2019-10-13 LAB — TSH: TSH: 1.52 u[IU]/mL (ref 0.450–4.500)

## 2019-10-13 LAB — T4, FREE: Free T4: 1.34 ng/dL (ref 0.82–1.77)

## 2019-10-13 MED ORDER — LEVOTHYROXINE SODIUM 125 MCG PO TABS: 125.0000 ug | ORAL_TABLET | Freq: Every day | ORAL | 2 refills | Status: DC

## 2019-10-16 ENCOUNTER — Telehealth: Payer: Self-pay

## 2019-10-16 NOTE — Telephone Encounter (Signed)
Patient name and DOB has been verified Patient was informed of lab results. Patient had no questions.  

## 2019-10-16 NOTE — Telephone Encounter (Signed)
-----   Message from Charlott Rakes, MD sent at 10/13/2019  1:37 PM EST ----- Cholesterol is normal, thyroid panel is normal

## 2019-10-24 ENCOUNTER — Other Ambulatory Visit: Payer: Self-pay | Admitting: Internal Medicine

## 2019-10-24 ENCOUNTER — Other Ambulatory Visit: Payer: Self-pay | Admitting: Cardiovascular Disease

## 2019-11-12 ENCOUNTER — Other Ambulatory Visit: Payer: Self-pay | Admitting: Internal Medicine

## 2019-11-12 ENCOUNTER — Other Ambulatory Visit: Payer: Self-pay | Admitting: Cardiovascular Disease

## 2019-11-12 NOTE — Telephone Encounter (Signed)
Please refill if patient should continue therapy.

## 2019-11-18 ENCOUNTER — Other Ambulatory Visit: Payer: Self-pay | Admitting: Cardiovascular Disease

## 2019-11-18 ENCOUNTER — Other Ambulatory Visit: Payer: Self-pay | Admitting: Internal Medicine

## 2019-11-22 ENCOUNTER — Other Ambulatory Visit: Payer: Self-pay | Admitting: Cardiovascular Disease

## 2019-12-05 ENCOUNTER — Other Ambulatory Visit: Payer: Self-pay | Admitting: Cardiovascular Disease

## 2019-12-09 ENCOUNTER — Other Ambulatory Visit: Payer: Self-pay | Admitting: Cardiovascular Disease

## 2019-12-10 ENCOUNTER — Other Ambulatory Visit: Payer: Self-pay | Admitting: Family Medicine

## 2019-12-10 ENCOUNTER — Other Ambulatory Visit: Payer: Self-pay | Admitting: Internal Medicine

## 2019-12-13 ENCOUNTER — Other Ambulatory Visit: Payer: Self-pay | Admitting: Emergency Medicine

## 2019-12-17 ENCOUNTER — Ambulatory Visit: Payer: Medicare Other | Admitting: Nurse Practitioner

## 2019-12-17 ENCOUNTER — Encounter: Payer: Self-pay | Admitting: Adult Health

## 2019-12-17 ENCOUNTER — Ambulatory Visit (INDEPENDENT_AMBULATORY_CARE_PROVIDER_SITE_OTHER): Payer: Medicare Other | Admitting: Adult Health

## 2019-12-17 ENCOUNTER — Ambulatory Visit (INDEPENDENT_AMBULATORY_CARE_PROVIDER_SITE_OTHER): Payer: Medicare Other

## 2019-12-17 ENCOUNTER — Other Ambulatory Visit: Payer: Self-pay

## 2019-12-17 ENCOUNTER — Other Ambulatory Visit: Payer: Self-pay | Admitting: Internal Medicine

## 2019-12-17 VITALS — BP 128/64 | HR 74 | Temp 97.9°F | Ht 64.0 in | Wt 204.8 lb

## 2019-12-17 DIAGNOSIS — G4733 Obstructive sleep apnea (adult) (pediatric): Secondary | ICD-10-CM | POA: Diagnosis not present

## 2019-12-17 DIAGNOSIS — J9611 Chronic respiratory failure with hypoxia: Secondary | ICD-10-CM | POA: Diagnosis not present

## 2019-12-17 DIAGNOSIS — J439 Emphysema, unspecified: Secondary | ICD-10-CM

## 2019-12-17 DIAGNOSIS — J449 Chronic obstructive pulmonary disease, unspecified: Secondary | ICD-10-CM | POA: Diagnosis not present

## 2019-12-17 NOTE — Assessment & Plan Note (Signed)
Excellent control and compliance on CPAP  Plan  Patient Instructions  COPD (chronic obstructive pulmonary disease) (HCC) Please continue Trelegy 1 inhalation daily.  Rinse and gargle after using. Use your albuterol inhaler 2 puffs up to every 4 hours if needed for shortness of breath. Chest x-ray today  Chronic respiratory failure with hypoxia (HCC) Continue on 3 L of oxygen  OSA (obstructive sleep apnea) Continue on CPAP at bedtime Keep up the good work Work on healthy weight loss Do not drive if sleepy  Follow up: Follow up with Dr. Lamonte Sakai in 4  months or sooner if needed

## 2019-12-17 NOTE — Assessment & Plan Note (Signed)
Clinically stable on Trelegy  Plan  Patient Instructions  COPD (chronic obstructive pulmonary disease) ([place]) Please continue Trelegy 1 inhalation daily.  Rinse and gargle after using. Use your albuterol inhaler 2 puffs up to every 4 hours if needed for shortness of breath. Chest x-ray today  Chronic respiratory failure with hypoxia (HCC) Continue on 3 L of oxygen  OSA (obstructive sleep apnea) Continue on CPAP at bedtime Keep up the good work Work on healthy weight loss Do not drive if sleepy  Follow up: Follow up with Dr. Lamonte Sakai in 4  months or sooner if needed

## 2019-12-17 NOTE — Patient Instructions (Signed)
COPD (chronic obstructive pulmonary disease) ([place]) Please continue Trelegy 1 inhalation daily.  Rinse and gargle after using. Use your albuterol inhaler 2 puffs up to every 4 hours if needed for shortness of breath. Chest x-ray today  Chronic respiratory failure with hypoxia (HCC) Continue on 3 L of oxygen  OSA (obstructive sleep apnea) Continue on CPAP at bedtime Keep up the good work Work on healthy weight loss Do not drive if sleepy  Follow up: Follow up with Dr. Lamonte Sakai in 4  months or sooner if needed

## 2019-12-17 NOTE — Assessment & Plan Note (Signed)
Stable on oxygen  Plan  Patient Instructions  COPD (chronic obstructive pulmonary disease) ([place]) Please continue Trelegy 1 inhalation daily.  Rinse and gargle after using. Use your albuterol inhaler 2 puffs up to every 4 hours if needed for shortness of breath. Chest x-ray today  Chronic respiratory failure with hypoxia (HCC) Continue on 3 L of oxygen  OSA (obstructive sleep apnea) Continue on CPAP at bedtime Keep up the good work Work on healthy weight loss Do not drive if sleepy  Follow up: Follow up with Dr. Lamonte Sakai in 4  months or sooner if needed
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
## 2019-12-18 ENCOUNTER — Other Ambulatory Visit: Payer: Self-pay | Admitting: Cardiovascular Disease

## 2019-12-18 MED ORDER — TRELEGY ELLIPTA 100-62.5-25 MCG/INH IN AEPB: 1.0000 | INHALATION_SPRAY | Freq: Every day | RESPIRATORY_TRACT | 0 refills | Status: DC

## 2019-12-18 NOTE — Addendum Note (Signed)
Addended by: Parke Poisson E on: 12/18/2019 04:21 PM   Modules accepted: Orders

## 2019-12-21 ENCOUNTER — Ambulatory Visit: Payer: Medicare Other | Admitting: Acute Care

## 2019-12-24 ENCOUNTER — Telehealth: Payer: Self-pay | Admitting: Adult Health

## 2019-12-24 ENCOUNTER — Other Ambulatory Visit: Payer: Self-pay | Admitting: *Deleted

## 2019-12-24 DIAGNOSIS — J9611 Chronic respiratory failure with hypoxia: Secondary | ICD-10-CM

## 2019-12-24 DIAGNOSIS — J439 Emphysema, unspecified: Secondary | ICD-10-CM

## 2019-12-24 NOTE — Telephone Encounter (Signed)
ATC patient.  LMTCB.  Per TP 12/21/19-  Mild increase in interstitial markings noted , office visit w/ no sx . ? Etiology  Could be from shallow breath , etc .  Would repeat in 4-6 weeks with office to make sure back to his baseline, sooner if develop resp sx, increased cough,dsypnea, etc   Please contact office for sooner follow up if symptoms do not improve or worsen or seek emergency care

## 2019-12-24 NOTE — Progress Notes (Signed)
Contacted pt to schedule f/u 4-6 out for f/u on last cxr. Pt aware he will get another cxr on 2/26 before appt. Pt had already be informed of results of cxr. Nothing further needed.

## 2019-12-24 NOTE — Progress Notes (Signed)
cxr ordered

## 2019-12-25 NOTE — Telephone Encounter (Signed)
Called and spoke with Patient.  Patient thought Tammy,NP was wanting Patient to have another cxr.  Patient is scheduled 01/22/20 with Tammy,NP, and is to have a cxr before visit.  Instructed patient to come early for OV to have cxr completed, before seeing Tammy,NP.  Understanding stated.  Nothing further at this time.

## 2019-12-30 ENCOUNTER — Telehealth: Payer: Self-pay | Admitting: Internal Medicine

## 2019-12-30 NOTE — Telephone Encounter (Signed)
Patient is getting tested for the [place] on 01/01/2020 and was advised to ask provider if it is ok as he takes blood thinners. Please contact patient at [phone]

## 2019-12-31 ENCOUNTER — Other Ambulatory Visit: Payer: Self-pay | Admitting: Cardiovascular Disease

## 2019-12-31 ENCOUNTER — Telehealth: Payer: Self-pay | Admitting: Internal Medicine

## 2019-12-31 NOTE — Telephone Encounter (Signed)
Pt son called the Pt is supposed to take the covid 10 and is on blood thinners and they wanted him to reach out to the PCP to see if it was ok

## 2019-12-31 NOTE — Telephone Encounter (Signed)
Returned pt call. Pt states he is getting the COVID Vaccine tomorrow and he just wanted to make sure it was okay to get it while on blood thinners. Per Dr. Wynetta Emery pt doesn't need to be off blood thinner while getting vaccine. Pt states he understands and doesn't have any questions or concerns

## 2019-12-31 NOTE — Telephone Encounter (Signed)
Please advice  

## 2020-01-01 NOTE — Telephone Encounter (Signed)
Made pt aware of PCP message. Verbalized understanding

## 2020-01-02 ENCOUNTER — Other Ambulatory Visit: Payer: Self-pay | Admitting: Cardiovascular Disease

## 2020-01-05 ENCOUNTER — Other Ambulatory Visit: Payer: Self-pay | Admitting: Internal Medicine

## 2020-01-06 ENCOUNTER — Encounter: Payer: Self-pay | Admitting: Cardiology

## 2020-01-06 ENCOUNTER — Other Ambulatory Visit: Payer: Self-pay

## 2020-01-06 ENCOUNTER — Ambulatory Visit (INDEPENDENT_AMBULATORY_CARE_PROVIDER_SITE_OTHER): Payer: Medicare Other | Admitting: Cardiology

## 2020-01-06 VITALS — BP 102/60 | HR 69 | Ht 64.0 in | Wt 206.0 lb

## 2020-01-06 DIAGNOSIS — I2510 Atherosclerotic heart disease of native coronary artery without angina pectoris: Secondary | ICD-10-CM | POA: Diagnosis not present

## 2020-01-06 DIAGNOSIS — E785 Hyperlipidemia, unspecified: Secondary | ICD-10-CM

## 2020-01-06 DIAGNOSIS — J449 Chronic obstructive pulmonary disease, unspecified: Secondary | ICD-10-CM

## 2020-01-06 DIAGNOSIS — I10 Essential (primary) hypertension: Secondary | ICD-10-CM | POA: Diagnosis not present

## 2020-01-06 DIAGNOSIS — G4733 Obstructive sleep apnea (adult) (pediatric): Secondary | ICD-10-CM | POA: Diagnosis not present

## 2020-01-06 NOTE — Patient Instructions (Addendum)
Medication Instructions:  Your physician recommends that you continue on your current medications as directed. Please refer to the Current Medication list given to you today.  *If you need a refill on your cardiac medications before your next appointment, please call your pharmacy*  Lab Work: None ordered  If you have labs (blood work) drawn today and your tests are completely normal, you will receive your results only by: Marland Kitchen MyChart Message (if you have MyChart) OR . A paper copy in the mail If you have any lab test that is abnormal or we need to change your treatment, we will call you to review the results.  Testing/Procedures: None ordered   Follow-Up: At [facility], you and your health needs are our priority.  As part of our continuing mission to provide you with exceptional heart care, we have created designated Provider Care Teams.  These Care Teams include your primary Cardiologist (physician) and Advanced Practice Providers (APPs -  Physician Assistants and Nurse Practitioners) who all work together to provide you with the care you need, when you need it.  Your next appointment:   1 year(s)  The format for your next appointment:   In Person  Provider:   You may see Mertie Moores, MD or one of the following Advanced Practice Providers on your designated Care Team:    Richardson Dopp, PA-C  [place], [place]  Daune Perch, NP   Other Instructions  [place] refers to food and lifestyle choices that are based on the traditions of countries located on the [employer]. This way of eating has been shown to help prevent certain conditions and improve outcomes for people who have chronic diseases, like kidney disease and heart disease. What are tips for following this plan? Lifestyle  Cook and eat meals together with your family, when possible.  Drink enough fluid to keep your urine clear or pale yellow.  Be physically active every  day. This includes: ? Aerobic exercise like running or swimming. ? Leisure activities like gardening, walking, or housework.  Get 7-8 hours of sleep each night.  If recommended by your health care provider, drink red wine in moderation. This means 1 glass a day for nonpregnant women and 2 glasses a day for men. A glass of wine equals 5 oz (150 mL). Reading food labels   Check the serving size of packaged foods. For foods such as rice and pasta, the serving size refers to the amount of cooked product, not dry.  Check the total fat in packaged foods. Avoid foods that have saturated fat or trans fats.  Check the ingredients list for added sugars, such as corn syrup. Shopping  At the grocery store, buy most of your food from the areas near the walls of the store. This includes: ? Fresh fruits and vegetables (produce). ? Grains, beans, nuts, and seeds. Some of these may be available in unpackaged forms or large amounts (in bulk). ? Fresh seafood. ? Poultry and eggs. ? Low-fat dairy products.  Buy whole ingredients instead of prepackaged foods.  Buy fresh fruits and vegetables in-season from local farmers markets.  Buy frozen fruits and vegetables in resealable bags.  If you do not have access to quality fresh seafood, buy precooked frozen shrimp or canned fish, such as tuna, salmon, or sardines.  Buy small amounts of raw or cooked vegetables, salads, or olives from the deli or salad bar at your store.  Stock your pantry so you always have certain  foods on hand, such as olive oil, canned tuna, canned tomatoes, rice, pasta, and beans. Cooking  Cook foods with extra-virgin olive oil instead of using butter or other vegetable oils.  Have meat as a side dish, and have vegetables or grains as your main dish. This means having meat in small portions or adding small amounts of meat to foods like pasta or stew.  Use beans or vegetables instead of meat in common dishes like chili or  lasagna.  Experiment with different cooking methods. Try roasting or broiling vegetables instead of steaming or sauteing them.  Add frozen vegetables to soups, stews, pasta, or rice.  Add nuts or seeds for added healthy fat at each meal. You can add these to yogurt, salads, or vegetable dishes.  Marinate fish or vegetables using olive oil, lemon juice, garlic, and fresh herbs. Meal planning   Plan to eat 1 vegetarian meal one day each week. Try to work up to 2 vegetarian meals, if possible.  Eat seafood 2 or more times a week.  Have healthy snacks readily available, such as: ? Vegetable sticks with hummus. ? [place] yogurt. ? Fruit and nut trail mix.  Eat balanced meals throughout the week. This includes: ? Fruit: 2-3 servings a day ? Vegetables: 4-5 servings a day ? Low-fat dairy: 2 servings a day ? Fish, poultry, or lean meat: 1 serving a day ? Beans and legumes: 2 or more servings a week ? Nuts and seeds: 1-2 servings a day ? Whole grains: 6-8 servings a day ? Extra-virgin olive oil: 3-4 servings a day  Limit red meat and sweets to only a few servings a month What are my food choices?  Mediterranean diet ? Recommended  Grains: Whole-grain pasta. Brown rice. Bulgar wheat. Polenta. Couscous. Whole-wheat bread. [place].  Vegetables: Artichokes. Beets. Broccoli. Cabbage. Carrots. Eggplant. Green beans. Chard. Kale. Spinach. Onions. Leeks. Peas. Squash. Tomatoes. Peppers. Radishes.  Fruits: Apples. Apricots. Avocado. Berries. Bananas. Cherries. Dates. Figs. Grapes. Lemons. Melon. Oranges. Peaches. Plums. Pomegranate.  Meats and other protein foods: Beans. Almonds. Sunflower seeds. Pine nuts. Peanuts. [place]. Salmon. Scallops. Shrimp. [place]. Tilapia. Clams. Oysters. Eggs.  Dairy: Low-fat milk. Cheese. Greek yogurt.  Beverages: Water. Red wine. Herbal tea.  Fats and oils: Extra virgin olive oil. Avocado oil. Grape seed oil.  Sweets and desserts: [place] yogurt with  honey. Baked apples. Poached pears. Trail mix.  Seasoning and other foods: Basil. Cilantro. Coriander. Cumin. Mint. Parsley. Sage. Rosemary. Tarragon. Garlic. Oregano. Thyme. Pepper. Balsalmic vinegar. Tahini. Hummus. Tomato sauce. Olives. Mushrooms. ? Limit these  Grains: Prepackaged pasta or rice dishes. Prepackaged cereal with added sugar.  Vegetables: Deep fried potatoes (french fries).  Fruits: Fruit canned in syrup.  Meats and other protein foods: Beef. Pork. Lamb. Poultry with skin. Hot dogs. [place] Salines.  Dairy: Ice cream. Sour cream. Whole milk.  Beverages: Juice. Sugar-sweetened soft drinks. Beer. Liquor and spirits.  Fats and oils: Butter. Canola oil. Vegetable oil. Beef fat (tallow). Lard.  Sweets and desserts: Cookies. Cakes. Pies. Candy.  Seasoning and other foods: Mayonnaise. Premade sauces and marinades. The items listed may not be a complete list. Talk with your dietitian about what dietary choices are right for you. Summary  The Mediterranean diet includes both food and lifestyle choices.  Eat a variety of fresh fruits and vegetables, beans, nuts, seeds, and whole grains.  Limit the amount of red meat and sweets that you eat.  Talk with your health care provider about whether it is safe for you  to drink red wine in moderation. This means 1 glass a day for nonpregnant women and 2 glasses a day for men. A glass of wine equals 5 oz (150 mL). This information is not intended to replace advice given to you by your health care provider. Make sure you discuss any questions you have with your health care provider. Document Revised: 07/12/2016 Document Reviewed: 07/05/2016 Elsevier Patient Education  2020 [place], NP
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
## 2020-01-07 ENCOUNTER — Other Ambulatory Visit: Payer: Self-pay | Admitting: Cardiovascular Disease

## 2020-01-07 NOTE — Addendum Note (Signed)
Addended by: Georgiann Cocker on: 01/07/2020 04:27 PM   Modules accepted: Orders

## 2020-01-12 ENCOUNTER — Other Ambulatory Visit: Payer: Self-pay | Admitting: Cardiovascular Disease

## 2020-01-15 ENCOUNTER — Other Ambulatory Visit: Payer: Self-pay | Admitting: Cardiovascular Disease

## 2020-01-22 ENCOUNTER — Ambulatory Visit (INDEPENDENT_AMBULATORY_CARE_PROVIDER_SITE_OTHER): Payer: Medicare Other

## 2020-01-22 ENCOUNTER — Ambulatory Visit (INDEPENDENT_AMBULATORY_CARE_PROVIDER_SITE_OTHER): Payer: Medicare Other | Admitting: Adult Health

## 2020-01-22 ENCOUNTER — Encounter: Payer: Self-pay | Admitting: Adult Health

## 2020-01-22 ENCOUNTER — Other Ambulatory Visit: Payer: Self-pay

## 2020-01-22 VITALS — BP 120/64 | HR 85 | Temp 98.2°F | Ht 64.25 in | Wt 205.4 lb

## 2020-01-22 DIAGNOSIS — I2510 Atherosclerotic heart disease of native coronary artery without angina pectoris: Secondary | ICD-10-CM | POA: Diagnosis not present

## 2020-01-22 DIAGNOSIS — J439 Emphysema, unspecified: Secondary | ICD-10-CM

## 2020-01-22 DIAGNOSIS — J9611 Chronic respiratory failure with hypoxia: Secondary | ICD-10-CM

## 2020-01-22 DIAGNOSIS — Z8709 Personal history of other diseases of the respiratory system: Secondary | ICD-10-CM | POA: Diagnosis not present

## 2020-01-22 DIAGNOSIS — R918 Other nonspecific abnormal finding of lung field: Secondary | ICD-10-CM | POA: Diagnosis not present

## 2020-01-22 NOTE — Patient Instructions (Addendum)
COPD (chronic obstructive pulmonary disease) ([place]) Please continue Trelegy 1 inhalation daily.  Rinse and gargle after using. Use your albuterol inhaler 2 puffs up to every 4 hours if needed for shortness of breath.   Chronic respiratory failure with hypoxia ([facility]) Continue on 3 L of oxygen Order to DME for oxygen tubing.   OSA (obstructive sleep apnea) Continue on CPAP at bedtime Keep up the good work Work on healthy weight loss Do not drive if sleepy  Follow up: Follow up with Dr. Lamonte Sakai in 4  months or sooner if needed

## 2020-01-22 NOTE — Assessment & Plan Note (Signed)
Cont on O2 .  

## 2020-01-22 NOTE — Assessment & Plan Note (Signed)
Stable without flare in symptoms  PFT , no airflow obstruction,  Severe Restriction noted.  Check CXR today, if increased interstitial changes , consider HRCT to r/o ILD   Plan  Patient Instructions  COPD (chronic obstructive pulmonary disease) ([place]) Please continue Trelegy 1 inhalation daily.  Rinse and gargle after using. Use your albuterol inhaler 2 puffs up to every 4 hours if needed for shortness of breath.   Chronic respiratory failure with hypoxia (H[facility]) Continue on 3 L of oxygen Order to DME for oxygen tubing.   OSA (obstructive sleep apnea) Continue on CPAP at bedtime Keep up the good work Work on healthy weight loss Do not drive if sleepy  Follow up: Follow up with Dr. Lamonte Sakai in 4  months or sooner if needed
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
## 2020-01-26 ENCOUNTER — Other Ambulatory Visit: Payer: Self-pay | Admitting: Adult Health

## 2020-01-26 DIAGNOSIS — R0602 Shortness of breath: Secondary | ICD-10-CM

## 2020-01-26 DIAGNOSIS — Q241 Levocardia: Secondary | ICD-10-CM

## 2020-01-30 ENCOUNTER — Other Ambulatory Visit: Payer: Self-pay | Admitting: Internal Medicine

## 2020-02-05 ENCOUNTER — Other Ambulatory Visit: Payer: Self-pay

## 2020-02-05 ENCOUNTER — Ambulatory Visit
Admission: RE | Admit: 2020-02-05 | Discharge: 2020-02-05 | Disposition: A | Discharge status: Home / Self Care | Payer: Medicare Other | Source: Ambulatory Visit | Attending: Adult Health | Admitting: Adult Health

## 2020-02-05 DIAGNOSIS — Q241 Levocardia: Secondary | ICD-10-CM

## 2020-02-05 DIAGNOSIS — R0602 Shortness of breath: Secondary | ICD-10-CM

## 2020-02-10 ENCOUNTER — Telehealth: Payer: Self-pay | Admitting: Adult Health

## 2020-02-10 NOTE — Telephone Encounter (Signed)
Chest x-ray shows scarring along the left and right bases. No evidence of interstitial lung disease  Continue with office visit recommendations  Called patient but the call did not go through. Will call back again tomorrow.

## 2020-02-10 NOTE — Progress Notes (Signed)
LMOMTCB x 1 

## 2020-02-11 NOTE — Telephone Encounter (Signed)
LMTCB x2 for pt 

## 2020-02-15 ENCOUNTER — Other Ambulatory Visit: Payer: Self-pay | Admitting: Cardiovascular Disease

## 2020-02-15 NOTE — Telephone Encounter (Signed)
Spoke with pt, aware of results/recs.   While on the phone, pt noted that he never received the O2 tubing ordered for him on 2/26.  PCCs please advise on status of that order.  Thanks!

## 2020-02-15 NOTE — Telephone Encounter (Signed)
Spoke with pt, aware of recs.  Nothing further needed at this time- will close encounter.   

## 2020-02-15 NOTE — Telephone Encounter (Signed)
High priority CM sent to [place] for an update.  Per [facility]'s response on 2/26, this was taken care of already.

## 2020-02-15 NOTE — Telephone Encounter (Signed)
Response from [place]:  Eduardo Osier D sent to Nadine Counts, Bethanne Ginger; Joellen Jersey  I am having it delivered to patient tomorrow   Estill Bamberg

## 2020-02-26 ENCOUNTER — Other Ambulatory Visit: Payer: Self-pay | Admitting: Internal Medicine

## 2020-03-04 ENCOUNTER — Other Ambulatory Visit: Payer: Self-pay | Admitting: Emergency Medicine

## 2020-03-10 ENCOUNTER — Other Ambulatory Visit: Payer: Self-pay | Admitting: Internal Medicine

## 2020-03-23 ENCOUNTER — Other Ambulatory Visit: Payer: Self-pay | Admitting: Internal Medicine

## 2020-03-28 ENCOUNTER — Other Ambulatory Visit: Payer: Self-pay | Admitting: Family Medicine

## 2020-03-28 DIAGNOSIS — I2510 Atherosclerotic heart disease of native coronary artery without angina pectoris: Secondary | ICD-10-CM

## 2020-03-30 ENCOUNTER — Other Ambulatory Visit: Payer: Self-pay | Admitting: Internal Medicine

## 2020-04-04 ENCOUNTER — Other Ambulatory Visit: Payer: Self-pay | Admitting: Internal Medicine

## 2020-04-14 ENCOUNTER — Other Ambulatory Visit: Payer: Self-pay | Admitting: Family Medicine
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
## 2020-04-21 ENCOUNTER — Ambulatory Visit: Payer: Medicare Other | Admitting: Pulmonary Disease

## 2020-04-21 ENCOUNTER — Other Ambulatory Visit: Payer: Self-pay | Admitting: Internal Medicine

## 2020-04-21 ENCOUNTER — Ambulatory Visit: Payer: Medicare Other | Admitting: Emergency Medicine

## 2020-04-21 DIAGNOSIS — I2510 Atherosclerotic heart disease of native coronary artery without angina pectoris: Secondary | ICD-10-CM

## 2020-04-23 ENCOUNTER — Other Ambulatory Visit: Payer: Self-pay | Admitting: Internal Medicine

## 2020-04-26 ENCOUNTER — Other Ambulatory Visit: Payer: Self-pay | Admitting: Family Medicine

## 2020-04-26 DIAGNOSIS — R350 Frequency of micturition: Secondary | ICD-10-CM

## 2020-05-06 ENCOUNTER — Other Ambulatory Visit: Payer: Self-pay | Admitting: Internal Medicine

## 2020-05-09 ENCOUNTER — Other Ambulatory Visit: Payer: Self-pay | Admitting: Internal Medicine

## 2020-05-15 ENCOUNTER — Other Ambulatory Visit: Payer: Self-pay | Admitting: Internal Medicine

## 2020-05-16 ENCOUNTER — Telehealth: Payer: Self-pay | Admitting: Adult Health

## 2020-05-16 NOTE — Telephone Encounter (Signed)
Spoke with pt. Advised him that he would need to call his PCP for his metformin refill. Nothing further was needed.

## 2020-05-17 ENCOUNTER — Ambulatory Visit: Payer: Medicare Other | Attending: Internal Medicine | Admitting: Internal Medicine

## 2020-05-17 ENCOUNTER — Other Ambulatory Visit: Payer: Self-pay

## 2020-05-17 DIAGNOSIS — R7303 Prediabetes: Secondary | ICD-10-CM

## 2020-05-17 DIAGNOSIS — J449 Chronic obstructive pulmonary disease, unspecified: Secondary | ICD-10-CM

## 2020-05-17 DIAGNOSIS — Z1211 Encounter for screening for malignant neoplasm of colon: Secondary | ICD-10-CM

## 2020-05-17 DIAGNOSIS — I10 Essential (primary) hypertension: Secondary | ICD-10-CM

## 2020-05-17 DIAGNOSIS — E039 Hypothyroidism, unspecified: Secondary | ICD-10-CM | POA: Diagnosis not present

## 2020-05-17 DIAGNOSIS — Z86010 Personal history of colonic polyps: Secondary | ICD-10-CM

## 2020-05-17 DIAGNOSIS — J9611 Chronic respiratory failure with hypoxia: Secondary | ICD-10-CM | POA: Diagnosis not present

## 2020-05-17 DIAGNOSIS — I2510 Atherosclerotic heart disease of native coronary artery without angina pectoris: Secondary | ICD-10-CM

## 2020-05-17 MED ORDER — LEVOTHYROXINE SODIUM 125 MCG PO TABS: 125.0000 ug | ORAL_TABLET | Freq: Every day | ORAL | 6 refills | Status: DC

## 2020-05-17 MED ORDER — FLUTICASONE PROPIONATE 50 MCG/ACT NA SUSP: NASAL | 3 refills | Status: DC

## 2020-05-17 MED ORDER — METFORMIN HCL 500 MG PO TABS: ORAL_TABLET | ORAL | 5 refills | Status: DC

## 2020-05-17 MED ORDER — ATORVASTATIN CALCIUM 40 MG PO TABS: ORAL_TABLET | ORAL | 6 refills | Status: DC

## 2020-05-17 NOTE — Progress Notes (Signed)
Virtual Visit via Telephone Note Due to current restrictions/limitations of in-office visits due to the COVID-19 pandemic, this scheduled clinical appointment was converted to a telehealth visit  I connected with George Robbins on 05/17/20 at 2:29 p.m by telephone and verified that I am speaking with the correct person using two identifiers. I am in my office.  The patient is at home.  Only the patient and myself participated in this encounter.  I discussed the limitations, risks, security and privacy concerns of performing an evaluation and management service by telephone and the availability of in person appointments. I also discussed with the patient that there may be a patient responsible charge related to this service. The patient expressed understanding and agreed to proceed.   History of Present Illness: Pt with hx ofcad,HL,htn, gerd, hypothyroidism,CKD, COPD, pre-DM, OSAon CPAP, chronic resp failure on continuous O2 3 Lt.  HYPERTENSION/CAD Currently taking: see medication list.  Last saw cardiology 12/2019 Med Adherence: [x]  Yes    []  No Medication side effects: []  Yes    [x]  No Adherence with salt restriction: [x]  Yes    []  No Home Monitoring?: []  Yes    [x]  No.  Last 2 blood pressure readings in the system that was done at specialty visit were normal. Monitoring Frequency: []  Yes    []  No Home BP results range: []  Yes    []  No SOB? [x]  Yes at times which he relates to COPD.    []  No Chest Pain?: []  Yes    [x]  No.  No recent use of SL Nitro Leg swelling?: []  Yes    [x]  No Headaches?: [x]  Yes -occasionally    []  No Dizziness? []  Yes    [x]  No Comments:  Walks to end of his driveway several times a wk as legs allow.  Gets leg cramps but not often  COPD: no recent increase cough.  Has sneezing.  Request RF on Flonase -reports compliance with Trelegy Compliant with O2  PreDM:  Compliant with Metformin.  Trying to eat health.  Eating more veggies, not too much red  meat.  Hypothyroid: reports compliance with Levothyroid  HM:  Completed COVIC vaccine but son keeps his card and is not available to give me that info so that I can update his record.  Due for next c-scope.  Hx of multiple polyps removed Outpatient Encounter Medications as of 05/17/2020  Medication Sig  . acetaminophen (TYLENOL) 500 MG tablet Take 650 mg by mouth at bedtime. Takes 2 tablets of arthritis brand tylenol at bedtime  . aspirin EC 81 MG tablet Take 1 tablet (81 mg total) by mouth daily.  Marland Kitchen atorvastatin (LIPITOR) 40 MG tablet Take 1 tab by mouth daily. Must have office visit for refills  . bisoprolol (ZEBETA) 5 MG tablet Take 1 tablet (5 mg total) by mouth daily.  . cetirizine (ZYRTEC) 10 MG tablet TAKE 1 TABLET BY MOUTH EVERY DAY  . clopidogrel (PLAVIX) 75 MG tablet TAKE 1 TABLET BY MOUTH ONCE DAILY WITH BREAKFAST  . diclofenac sodium (VOLTAREN) 1 % GEL Apply 2 g topically 4 (four) times daily.  Marland Kitchen ezetimibe (ZETIA) 10 MG tablet Take 1 tablet (10 mg total) by mouth daily.  . fluticasone (FLONASE) 50 MCG/ACT nasal spray USE 2 SPRAYS INTO EACH NOSTRIL ONCE DAILY. Must have office visit for refills  . Fluticasone-Umeclidin-Vilant (TRELEGY ELLIPTA) 100-62.5-25 MCG/INH AEPB Inhale 1 puff into the lungs daily.  Marland Kitchen levothyroxine (SYNTHROID) 125 MCG tablet TAKE 1 TABLET (125 MCG TOTAL) BY  MOUTH DAILY BEFORE BREAKFAST. MUST HAVE OFFICE VISIT FOR REFILLS  . metFORMIN (GLUCOPHAGE) 500 MG tablet TAKE 1/2 TABLET EVERY DAY WITH BREAKFAST. Must have office visit for refills  . Multiple Vitamin (MULTIVITAMIN WITH MINERALS) TABS tablet Take 1 tablet by mouth daily.  . naphazoline-glycerin (CLEAR EYES) 0.012-0.2 % SOLN Place 1-2 drops into both eyes every morning.  . nitroGLYCERIN (NITROSTAT) 0.4 MG SL tablet Place 1 tablet (0.4 mg total) under the tongue every 5 (five) minutes as needed for chest pain.  Marland Kitchen olopatadine (PATANOL) 0.1 % ophthalmic solution PLACE 1 DROP INTO BOTH EYES DAILY AS NEEDED FOR  ALLERGIES.  Marland Kitchen pantoprazole (PROTONIX) 40 MG tablet Take 1 tablet (40 mg total) by mouth daily.  . tamsulosin (FLOMAX) 0.4 MG CAPS capsule TAKE 1 CAPSULE BY MOUTH EVERY DAY  . TOVIAZ 8 MG TB24 tablet Take 8 mg by mouth daily.  . TRELEGY ELLIPTA 100-62.5-25 MCG/INH AEPB INHALE 1 PUFF BY MOUTH EVERY DAY  . VENTOLIN HFA 108 (90 Base) MCG/ACT inhaler Inhale 2 puffs into the lungs every 6 (six) hours as needed for wheezing or shortness of breath.   No facility-administered encounter medications on file as of 05/17/2020.      Observations/Objective: No direct observation done as this was a telephone encounter.  Assessment and Plan: 1. Essential hypertension At goal.  Continue current medications and low-salt diet - CBC - Comprehensive metabolic panel - Lipid panel  2. Coronary artery disease involving native coronary artery of native heart without angina pectoris Clinically stable.  Continue aspirin, Zebeta, Zetia, Plavix and Lipitor.  Advised patient that the cramps that he gets in the legs at times is most likely due to the Lipitor.  He reports that the cramps are not bothersome often enough to warrant making any changes at this time. - atorvastatin (LIPITOR) 40 MG tablet; Take 1 tab by mouth daily.  Dispense: 30 tablet; Refill: 6  3. Prediabetes Continue Metformin.  Discussed healthy eating habits.  He will move as much as he is able - metFORMIN (GLUCOPHAGE) 500 MG tablet; TAKE 1/2 TABLET EVERY DAY WITH BREAKFAST.  Dispense: 15 tablet; Refill: 5  4. Acquired hypothyroidism - levothyroxine (SYNTHROID) 125 MCG tablet; Take 1 tablet (125 mcg total) by mouth daily before breakfast.  Dispense: 30 tablet; Refill: 6 - TSH  5. Chronic obstructive pulmonary disease, unspecified COPD type ([place]) Followed by pulmonary.  Continue current inhalers including trilogy  6. Chronic respiratory failure with hypoxia (HCC) On home O2 continuous  7. History of colon polyps Overdue for repeat colonoscopy.   He was sent a letter back in 04/2019 from the gastroenterologist - Ambulatory referral to Gastroenterology  8. Screening for colon cancer - Ambulatory referral to Gastroenterology  Follow Up Instructions: 3 mths   I discussed the assessment and treatment plan with the patient. The patient was provided an opportunity to ask questions and all were answered. The patient agreed with the plan and demonstrated an understanding of the instructions.   The patient was advised to call back or seek an in-person evaluation if the symptoms worsen or if the condition fails to improve as anticipated.  I provided 12 minutes of non-face-to-face time during this encounter.   Karle Plumber, MD

## 2020-05-26 ENCOUNTER — Other Ambulatory Visit: Payer: Medicare Other

## 2020-05-27 LAB — LIPID PANEL
Chol/HDL Ratio: 2.7 ratio (ref 0.0–5.0)
Cholesterol, Total: 137 mg/dL (ref 100–199)
HDL: 50 mg/dL (ref >39)
LDL Chol Calc (NIH): 63 mg/dL (ref 0–99)
Triglycerides: 137 mg/dL (ref 0–149)
VLDL Cholesterol Cal: 24 mg/dL (ref 5–40)

## 2020-05-27 LAB — CBC
Hematocrit: 41.1 % (ref 37.5–51.0)
Hemoglobin: 13.1 g/dL (ref 13.0–17.7)
MCH: 28.5 pg (ref 26.6–33.0)
MCHC: 31.9 g/dL (ref 31.5–35.7)
MCV: 90 fL (ref 79–97)
Platelets: 158 10*3/uL (ref 150–450)
RBC: 4.59 x10E6/uL (ref 4.14–5.80)
RDW: 14.5 % (ref 11.6–15.4)
WBC: 10 10*3/uL (ref 3.4–10.8)

## 2020-05-27 LAB — COMPREHENSIVE METABOLIC PANEL
ALT: 30 IU/L (ref 0–44)
AST: 18 IU/L (ref 0–40)
Albumin/Globulin Ratio: 1.8 (ref 1.2–2.2)
Albumin: 4.5 g/dL (ref 3.8–4.8)
Alkaline Phosphatase: 114 IU/L (ref 48–121)
BUN/Creatinine Ratio: 12 (ref 10–24)
BUN: 14 mg/dL (ref 8–27)
Bilirubin Total: 0.3 mg/dL (ref 0.0–1.2)
CO2: 28 mmol/L (ref 20–29)
Calcium: 10.2 mg/dL (ref 8.6–10.2)
Chloride: 101 mmol/L (ref 96–106)
Creatinine, Ser: 1.13 mg/dL (ref 0.76–1.27)
GFR calc Af Amer: 77 mL/min/{1.73_m2} (ref >59)
GFR calc non Af Amer: 67 mL/min/{1.73_m2} (ref >59)
Globulin, Total: 2.5 g/dL (ref 1.5–4.5)
Glucose: 82 mg/dL (ref 65–99)
Potassium: 4.4 mmol/L (ref 3.5–5.2)
Sodium: 141 mmol/L (ref 134–144)
Total Protein: 7 g/dL (ref 6.0–8.5)

## 2020-05-27 LAB — TSH: TSH: 2.18 u[IU]/mL (ref 0.450–4.500)

## 2020-05-27 NOTE — Progress Notes (Signed)
Let patient know that his blood count is normal meaning no anemia.  Kidney and liver function tests good.  Cholesterol level normal.  Thyroid level normal.

## 2020-05-31 ENCOUNTER — Telehealth: Payer: Self-pay

## 2020-05-31 NOTE — Telephone Encounter (Signed)
Contacted pt to go over lab results pt is aware and doesn't have any questions or concerns 

## 2020-06-15 ENCOUNTER — Other Ambulatory Visit: Payer: Self-pay | Admitting: Internal Medicine

## 2020-06-15 NOTE — Telephone Encounter (Signed)
Requested Prescriptions  Pending Prescriptions Disp Refills  . clopidogrel (PLAVIX) 75 MG tablet [Pharmacy Med Name: CLOPIDOGREL 75 MG TABLET] 90 tablet 1    Sig: TAKE 1 TABLET BY MOUTH ONCE DAILY WITH BREAKFAST     Hematology: Antiplatelets - clopidogrel Failed - 06/15/2020  3:53 PM      Failed - Evaluate AST, ALT within 2 months of therapy initiation.      Passed - ALT in normal range and within 360 days    ALT  Date Value Ref Range Status  05/26/2020 30 0 - 44 IU/L Final         Passed - AST in normal range and within 360 days    AST  Date Value Ref Range Status  05/26/2020 18 0 - 40 IU/L Final         Passed - HCT in normal range and within 180 days    Hematocrit  Date Value Ref Range Status  05/26/2020 41.1 37.5 - 51.0 % Final         Passed - HGB in normal range and within 180 days    Hemoglobin  Date Value Ref Range Status  05/26/2020 13.1 13.0 - 17.7 g/dL Final         Passed - PLT in normal range and within 180 days    Platelets  Date Value Ref Range Status  05/26/2020 158 150 - 450 x10E3/uL Final         Passed - Valid encounter within last 6 months    Recent Outpatient Visits          4 weeks ago Essential hypertension   [place], Deborah B, MD   8 months ago Prediabetes   Rippey, Enobong, MD   1 year ago Essential hypertension   [place], Deborah B, MD   1 year ago Essential hypertension   Weinert, Deborah B, MD   1 year ago Essential hypertension   [place], [place]      Future Appointments            In 2 months Wynetta Emery Dalbert Batman, MD [place]

## 2020-06-21 ENCOUNTER — Telehealth: Payer: Self-pay

## 2020-06-21 NOTE — Telephone Encounter (Signed)
Please let patient know that [facility] Dr. Vena Rua office has been trying to reach him to schedule his colonoscopy. Please give him their phone number and ask that he call them back to schedule.   Contacted pt and lvm

## 2020-07-15 ENCOUNTER — Other Ambulatory Visit: Payer: Self-pay | Admitting: Internal Medicine

## 2020-07-24 ENCOUNTER — Encounter (HOSPITAL_COMMUNITY): Payer: Self-pay | Admitting: Emergency Medicine

## 2020-07-24 ENCOUNTER — Emergency Department (HOSPITAL_COMMUNITY): Payer: Medicare Other

## 2020-07-24 ENCOUNTER — Other Ambulatory Visit: Payer: Self-pay

## 2020-07-24 ENCOUNTER — Emergency Department (HOSPITAL_COMMUNITY)
Admission: EM | Admit: 2020-07-24 | Discharge: 2020-07-25 | Disposition: A | Discharge status: Home / Self Care | Payer: Medicare Other | Attending: Emergency Medicine | Admitting: Emergency Medicine

## 2020-07-24 DIAGNOSIS — I129 Hypertensive chronic kidney disease with stage 1 through stage 4 chronic kidney disease, or unspecified chronic kidney disease: Secondary | ICD-10-CM | POA: Insufficient documentation

## 2020-07-24 DIAGNOSIS — Z79899 Other long term (current) drug therapy: Secondary | ICD-10-CM | POA: Diagnosis not present

## 2020-07-24 DIAGNOSIS — M25512 Pain in left shoulder: Secondary | ICD-10-CM | POA: Diagnosis not present

## 2020-07-24 DIAGNOSIS — Z7982 Long term (current) use of aspirin: Secondary | ICD-10-CM | POA: Diagnosis not present

## 2020-07-24 DIAGNOSIS — Z87891 Personal history of nicotine dependence: Secondary | ICD-10-CM | POA: Insufficient documentation

## 2020-07-24 DIAGNOSIS — J449 Chronic obstructive pulmonary disease, unspecified: Secondary | ICD-10-CM | POA: Insufficient documentation

## 2020-07-24 DIAGNOSIS — R079 Chest pain, unspecified: Secondary | ICD-10-CM | POA: Insufficient documentation

## 2020-07-24 DIAGNOSIS — T675XXA Heat exhaustion, unspecified, initial encounter: Secondary | ICD-10-CM | POA: Insufficient documentation

## 2020-07-24 DIAGNOSIS — Z7984 Long term (current) use of oral hypoglycemic drugs: Secondary | ICD-10-CM | POA: Diagnosis not present

## 2020-07-24 DIAGNOSIS — E039 Hypothyroidism, unspecified: Secondary | ICD-10-CM | POA: Insufficient documentation

## 2020-07-24 DIAGNOSIS — I2510 Atherosclerotic heart disease of native coronary artery without angina pectoris: Secondary | ICD-10-CM | POA: Diagnosis not present

## 2020-07-24 DIAGNOSIS — N183 Chronic kidney disease, stage 3 unspecified: Secondary | ICD-10-CM | POA: Insufficient documentation

## 2020-07-24 DIAGNOSIS — R52 Pain, unspecified: Secondary | ICD-10-CM | POA: Diagnosis not present

## 2020-07-24 DIAGNOSIS — M25519 Pain in unspecified shoulder: Secondary | ICD-10-CM | POA: Diagnosis not present

## 2020-07-24 LAB — CBC WITH DIFFERENTIAL/PLATELET
Abs Immature Granulocytes: 0.02 10*3/uL (ref 0.00–0.07)
Basophils Absolute: 0 10*3/uL (ref 0.0–0.1)
Basophils Relative: 0 %
Eosinophils Absolute: 0.4 10*3/uL (ref 0.0–0.5)
Eosinophils Relative: 3 %
HCT: 40.6 % (ref 39.0–52.0)
Hemoglobin: 13.1 g/dL (ref 13.0–17.0)
Immature Granulocytes: 0 %
Lymphocytes Relative: 13 %
Lymphs Abs: 1.4 10*3/uL (ref 0.7–4.0)
MCH: 28.2 pg (ref 26.0–34.0)
MCHC: 32.3 g/dL (ref 30.0–36.0)
MCV: 87.5 fL (ref 80.0–100.0)
Monocytes Absolute: 0.7 10*3/uL (ref 0.1–1.0)
Monocytes Relative: 6 %
Neutro Abs: 8.7 10*3/uL — ABNORMAL HIGH (ref 1.7–7.7)
Neutrophils Relative %: 78 %
Platelets: 150 10*3/uL (ref 150–400)
RBC: 4.64 MIL/uL (ref 4.22–5.81)
RDW: 14.6 % (ref 11.5–15.5)
WBC: 11.2 10*3/uL — ABNORMAL HIGH (ref 4.0–10.5)
nRBC: 0 % (ref 0.0–0.2)

## 2020-07-24 NOTE — ED Notes (Signed)
Pt called for triage, no response. 

## 2020-07-24 NOTE — ED Notes (Signed)
Pt stopped this NT to ask why he had not been called. Pt was informed that his name had been called multiple times for triage with no response.

## 2020-07-24 NOTE — ED Triage Notes (Signed)
Brought in by ems, pain across shoulder blades.  Pt has cardiac hx and family wanted him evaluated.  Pt is now pain free.  EKG was WNL for ems

## 2020-07-24 NOTE — ED Notes (Signed)
Pt called for triage x2, no response.

## 2020-07-25 LAB — COMPREHENSIVE METABOLIC PANEL
ALT: 32 U/L (ref 0–44)
AST: 19 U/L (ref 15–41)
Albumin: 4 g/dL (ref 3.5–5.0)
Alkaline Phosphatase: 107 U/L (ref 38–126)
Anion gap: 9 (ref 5–15)
BUN: 12 mg/dL (ref 8–23)
CO2: 29 mmol/L (ref 22–32)
Calcium: 9.9 mg/dL (ref 8.9–10.3)
Chloride: 102 mmol/L (ref 98–111)
Creatinine, Ser: 1.21 mg/dL (ref 0.61–1.24)
GFR calc Af Amer: >60 mL/min (ref >60)
GFR calc non Af Amer: >60 mL/min (ref >60)
Glucose, Bld: 93 mg/dL (ref 70–99)
Potassium: 4.1 mmol/L (ref 3.5–5.1)
Sodium: 140 mmol/L (ref 135–145)
Total Bilirubin: 0.6 mg/dL (ref 0.3–1.2)
Total Protein: 7.1 g/dL (ref 6.5–8.1)

## 2020-07-25 LAB — TROPONIN I (HIGH SENSITIVITY)
Troponin I (High Sensitivity): 8 ng/L (ref <18)
Troponin I (High Sensitivity): 7 ng/L (ref <18)

## 2020-07-25 NOTE — Discharge Instructions (Addendum)
Tried to avoid the heat. Continue your current medications. Follow-up with your doctor this week if you have any recurrent symptoms. Return to the ED as needed
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
## 2020-08-30 ENCOUNTER — Other Ambulatory Visit: Payer: Self-pay

## 2020-08-30 ENCOUNTER — Ambulatory Visit (HOSPITAL_BASED_OUTPATIENT_CLINIC_OR_DEPARTMENT_OTHER): Payer: Medicare Other | Admitting: Pharmacist

## 2020-08-30 ENCOUNTER — Ambulatory Visit: Payer: Medicare Other | Attending: Internal Medicine | Admitting: Internal Medicine

## 2020-08-30 ENCOUNTER — Encounter: Payer: Self-pay | Admitting: Internal Medicine

## 2020-08-30 VITALS — BP 110/65 | HR 70 | Resp 16 | Wt 209.6 lb

## 2020-08-30 DIAGNOSIS — Z79899 Other long term (current) drug therapy: Secondary | ICD-10-CM | POA: Insufficient documentation

## 2020-08-30 DIAGNOSIS — Z7982 Long term (current) use of aspirin: Secondary | ICD-10-CM | POA: Insufficient documentation

## 2020-08-30 DIAGNOSIS — M25511 Pain in right shoulder: Secondary | ICD-10-CM | POA: Insufficient documentation

## 2020-08-30 DIAGNOSIS — G4733 Obstructive sleep apnea (adult) (pediatric): Secondary | ICD-10-CM | POA: Diagnosis not present

## 2020-08-30 DIAGNOSIS — I129 Hypertensive chronic kidney disease with stage 1 through stage 4 chronic kidney disease, or unspecified chronic kidney disease: Secondary | ICD-10-CM | POA: Diagnosis not present

## 2020-08-30 DIAGNOSIS — Z87891 Personal history of nicotine dependence: Secondary | ICD-10-CM | POA: Insufficient documentation

## 2020-08-30 DIAGNOSIS — E039 Hypothyroidism, unspecified: Secondary | ICD-10-CM | POA: Insufficient documentation

## 2020-08-30 DIAGNOSIS — I25119 Atherosclerotic heart disease of native coronary artery with unspecified angina pectoris: Secondary | ICD-10-CM | POA: Diagnosis not present

## 2020-08-30 DIAGNOSIS — I2510 Atherosclerotic heart disease of native coronary artery without angina pectoris: Secondary | ICD-10-CM | POA: Diagnosis not present

## 2020-08-30 DIAGNOSIS — Z6837 Body mass index (BMI) 37.0-37.9, adult: Secondary | ICD-10-CM | POA: Insufficient documentation

## 2020-08-30 DIAGNOSIS — Z7984 Long term (current) use of oral hypoglycemic drugs: Secondary | ICD-10-CM | POA: Diagnosis not present

## 2020-08-30 DIAGNOSIS — R7303 Prediabetes: Secondary | ICD-10-CM

## 2020-08-30 DIAGNOSIS — E785 Hyperlipidemia, unspecified: Secondary | ICD-10-CM | POA: Diagnosis not present

## 2020-08-30 DIAGNOSIS — Z9981 Dependence on supplemental oxygen: Secondary | ICD-10-CM | POA: Insufficient documentation

## 2020-08-30 DIAGNOSIS — N182 Chronic kidney disease, stage 2 (mild): Secondary | ICD-10-CM | POA: Diagnosis not present

## 2020-08-30 DIAGNOSIS — K219 Gastro-esophageal reflux disease without esophagitis: Secondary | ICD-10-CM | POA: Insufficient documentation

## 2020-08-30 DIAGNOSIS — Z8249 Family history of ischemic heart disease and other diseases of the circulatory system: Secondary | ICD-10-CM | POA: Insufficient documentation

## 2020-08-30 DIAGNOSIS — J449 Chronic obstructive pulmonary disease, unspecified: Secondary | ICD-10-CM | POA: Insufficient documentation

## 2020-08-30 DIAGNOSIS — Z955 Presence of coronary angioplasty implant and graft: Secondary | ICD-10-CM | POA: Insufficient documentation

## 2020-08-30 DIAGNOSIS — I10 Essential (primary) hypertension: Secondary | ICD-10-CM | POA: Diagnosis not present

## 2020-08-30 DIAGNOSIS — N401 Enlarged prostate with lower urinary tract symptoms: Secondary | ICD-10-CM | POA: Diagnosis not present

## 2020-08-30 DIAGNOSIS — Z23 Encounter for immunization: Secondary | ICD-10-CM

## 2020-08-30 DIAGNOSIS — M545 Low back pain, unspecified: Secondary | ICD-10-CM | POA: Diagnosis not present

## 2020-08-30 DIAGNOSIS — N3281 Overactive bladder: Secondary | ICD-10-CM | POA: Insufficient documentation

## 2020-08-30 DIAGNOSIS — Z86010 Personal history of colonic polyps: Secondary | ICD-10-CM | POA: Diagnosis not present

## 2020-08-30 DIAGNOSIS — E1122 Type 2 diabetes mellitus with diabetic chronic kidney disease: Secondary | ICD-10-CM | POA: Diagnosis not present

## 2020-08-30 DIAGNOSIS — G8929 Other chronic pain: Secondary | ICD-10-CM | POA: Diagnosis not present

## 2020-08-30 DIAGNOSIS — M25512 Pain in left shoulder: Secondary | ICD-10-CM | POA: Diagnosis not present

## 2020-08-30 DIAGNOSIS — R350 Frequency of micturition: Secondary | ICD-10-CM

## 2020-08-30 DIAGNOSIS — J9611 Chronic respiratory failure with hypoxia: Secondary | ICD-10-CM | POA: Diagnosis not present

## 2020-08-30 LAB — POCT GLYCOSYLATED HEMOGLOBIN (HGB A1C): HbA1c, POC (prediabetic range): 6 % (ref 5.7–6.4)

## 2020-08-30 MED ORDER — DICLOFENAC SODIUM 1 % EX GEL
2.0000 g | Freq: Four times a day (QID) | CUTANEOUS | 2 refills | Status: AC
Start: 2020-08-30 — End: ?

## 2020-08-30 NOTE — Patient Instructions (Addendum)
[facility] Dr. Venita Sheffield office has been trying to reach you to schedule your colonoscopy.  Please call them at [phone] to schedule.  I have referred you back to the urologist for your urinary symptoms.  I have sent a prescription to your pharmacy for Voltaren gel to use on your shoulders for the shoulder pain.   Obesity, Adult Obesity is having too much body fat. Being obese means that your weight is more than what is healthy for you. BMI is a number that explains how much body fat you have. If you have a BMI of 30 or more, you are obese. Obesity is often caused by eating or drinking more calories than your body uses. Changing your lifestyle can help you lose weight. Obesity can cause serious health problems, such as:  Stroke.  Coronary artery disease (CAD).  Type 2 diabetes.  Some types of cancer, including cancers of the colon, breast, uterus, and gallbladder.  Osteoarthritis.  High blood pressure (hypertension).  High cholesterol.  Sleep apnea.  Gallbladder stones.  Infertility problems. What are the causes?  Eating meals each day that are high in calories, sugar, and fat.  Being born with genes that may make you more likely to become obese.  Having a medical condition that causes obesity.  Taking certain medicines.  Sitting a lot (having a sedentary lifestyle).  Not getting enough sleep.  Drinking a lot of drinks that have sugar in them. What increases the risk?  Having a family history of obesity.  Being an [place] American woman.  Being a Hispanic man.  Living in an area with limited access to: ? Romilda Garret, recreation centers, or sidewalks. ? Healthy food choices, such as grocery stores and farmers' markets. What are the signs or symptoms? The main sign is having too much body fat. How is this treated?  Treatment for this condition often includes changing your lifestyle. Treatment may include: ? Changing your diet. This may include  making a healthy meal plan. ? Exercise. This may include activity that causes your heart to beat faster (aerobic exercise) and strength training. Work with your doctor to design a program that works for you. ? Medicine to help you lose weight. This may be used if you are not able to lose 1 pound a week after 6 weeks of healthy eating and more exercise. ? Treating conditions that cause the obesity. ? Surgery. Options may include gastric banding and gastric bypass. This may be done if:  Other treatments have not helped to improve your condition.  You have a BMI of 40 or higher.  You have life-threatening health problems related to obesity. Follow these instructions at home: Eating and drinking   Follow advice from your doctor about what to eat and drink. Your doctor may tell you to: ? Limit fast food, sweets, and processed snack foods. ? Choose low-fat options. For example, choose low-fat milk instead of whole milk. ? Eat 5 or more servings of fruits or vegetables each day. ? Eat at home more often. This gives you more control over what you eat. ? Choose healthy foods when you eat out. ? Learn to read food labels. This will help you learn how much food is in 1 serving. ? Keep low-fat snacks available. ? Avoid drinks that have a lot of sugar in them. These include soda, fruit juice, iced tea with sugar, and flavored milk.  Drink enough water to keep your pee (urine) pale yellow.  Do not go on fad diets. Physical  activity  Exercise often, as told by your doctor. Most adults should get up to 150 minutes of moderate-intensity exercise every week.Ask your doctor: ? What types of exercise are safe for you. ? How often you should exercise.  Warm up and stretch before being active.  Do slow stretching after being active (cool down).  Rest between times of being active. Lifestyle  Work with your doctor and a food expert (dietitian) to set a weight-loss goal that is best for  you.  Limit your screen time.  Find ways to reward yourself that do not involve food.  Do not drink alcohol if: ? Your doctor tells you not to drink. ? You are pregnant, may be pregnant, or are planning to become pregnant.  If you drink alcohol: ? Limit how much you use to:  0-1 drink a day for women.  0-2 drinks a day for men. ? Be aware of how much alcohol is in your drink. In the [place] one drink equals one 12 oz bottle of beer (355 mL), one 5 oz glass of wine (148 mL), or one 1 oz glass of hard liquor (44 mL). General instructions  Keep a weight-loss journal. This can help you keep track of: ? The food that you eat. ? How much exercise you get.  Take over-the-counter and prescription medicines only as told by your doctor.  Take vitamins and supplements only as told by your doctor.  Think about joining a support group.  Keep all follow-up visits as told by your doctor. This is important. Contact a doctor if:  You cannot meet your weight loss goal after you have changed your diet and lifestyle for 6 weeks. Get help right away if you:  Are having trouble breathing.  Are having thoughts of harming yourself. Summary  Obesity is having too much body fat.  Being obese means that your weight is more than what is healthy for you.  Work with your doctor to set a weight-loss goal.  Get regular exercise as told by your doctor. This information is not intended to replace advice given to you by your health care provider. Make sure you discuss any questions you have with your health care provider. Document Revised: 07/17/2018 Document Reviewed: 07/17/2018 Elsevier Patient Education  2020 [employer].
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
## 2020-08-30 NOTE — Progress Notes (Signed)
Patient presents for vaccination against influenza per orders of Dr. Dr. Wynetta Emery. Consent given. Counseling provided. No contraindications exists. Vaccine administered without incident.   Benard Halsted, PharmD, [place] [phone]

## 2020-09-06 ENCOUNTER — Ambulatory Visit: Payer: Medicare Other | Admitting: Pharmacist

## 2020-09-13 ENCOUNTER — Other Ambulatory Visit: Payer: Self-pay | Admitting: Internal Medicine

## 2020-09-21 ENCOUNTER — Encounter (HOSPITAL_COMMUNITY): Payer: Self-pay | Admitting: Emergency Medicine

## 2020-09-21 ENCOUNTER — Emergency Department (HOSPITAL_COMMUNITY)
Admission: EM | Admit: 2020-09-21 | Discharge: 2020-09-21 | Disposition: A | Discharge status: Home / Self Care | Payer: Medicare Other | Attending: Emergency Medicine | Admitting: Emergency Medicine

## 2020-09-21 ENCOUNTER — Emergency Department (HOSPITAL_COMMUNITY): Payer: Medicare Other

## 2020-09-21 ENCOUNTER — Other Ambulatory Visit: Payer: Self-pay

## 2020-09-21 DIAGNOSIS — I129 Hypertensive chronic kidney disease with stage 1 through stage 4 chronic kidney disease, or unspecified chronic kidney disease: Secondary | ICD-10-CM | POA: Insufficient documentation

## 2020-09-21 DIAGNOSIS — R7303 Prediabetes: Secondary | ICD-10-CM | POA: Insufficient documentation

## 2020-09-21 DIAGNOSIS — Z79899 Other long term (current) drug therapy: Secondary | ICD-10-CM | POA: Insufficient documentation

## 2020-09-21 DIAGNOSIS — I25119 Atherosclerotic heart disease of native coronary artery with unspecified angina pectoris: Secondary | ICD-10-CM | POA: Insufficient documentation

## 2020-09-21 DIAGNOSIS — Z7984 Long term (current) use of oral hypoglycemic drugs: Secondary | ICD-10-CM | POA: Insufficient documentation

## 2020-09-21 DIAGNOSIS — Z7901 Long term (current) use of anticoagulants: Secondary | ICD-10-CM | POA: Diagnosis not present

## 2020-09-21 DIAGNOSIS — R6883 Chills (without fever): Secondary | ICD-10-CM | POA: Diagnosis not present

## 2020-09-21 DIAGNOSIS — Z87891 Personal history of nicotine dependence: Secondary | ICD-10-CM | POA: Diagnosis not present

## 2020-09-21 DIAGNOSIS — J449 Chronic obstructive pulmonary disease, unspecified: Secondary | ICD-10-CM | POA: Diagnosis not present

## 2020-09-21 DIAGNOSIS — R059 Cough, unspecified: Secondary | ICD-10-CM | POA: Diagnosis not present

## 2020-09-21 DIAGNOSIS — Z20822 Contact with and (suspected) exposure to covid-19: Secondary | ICD-10-CM | POA: Insufficient documentation

## 2020-09-21 DIAGNOSIS — E039 Hypothyroidism, unspecified: Secondary | ICD-10-CM | POA: Diagnosis not present

## 2020-09-21 DIAGNOSIS — I10 Essential (primary) hypertension: Secondary | ICD-10-CM | POA: Diagnosis not present

## 2020-09-21 DIAGNOSIS — Z7982 Long term (current) use of aspirin: Secondary | ICD-10-CM | POA: Diagnosis not present

## 2020-09-21 DIAGNOSIS — J9811 Atelectasis: Secondary | ICD-10-CM | POA: Diagnosis not present

## 2020-09-21 DIAGNOSIS — N183 Chronic kidney disease, stage 3 unspecified: Secondary | ICD-10-CM | POA: Insufficient documentation

## 2020-09-21 LAB — CBC WITH DIFFERENTIAL/PLATELET
Abs Immature Granulocytes: 0.02 10*3/uL (ref 0.00–0.07)
Basophils Absolute: 0 10*3/uL (ref 0.0–0.1)
Basophils Relative: 0 %
Eosinophils Absolute: 0.3 10*3/uL (ref 0.0–0.5)
Eosinophils Relative: 3 %
HCT: 40.7 % (ref 39.0–52.0)
Hemoglobin: 13.2 g/dL (ref 13.0–17.0)
Immature Granulocytes: 0 %
Lymphocytes Relative: 25 %
Lymphs Abs: 2.5 10*3/uL (ref 0.7–4.0)
MCH: 28.2 pg (ref 26.0–34.0)
MCHC: 32.4 g/dL (ref 30.0–36.0)
MCV: 87 fL (ref 80.0–100.0)
Monocytes Absolute: 0.6 10*3/uL (ref 0.1–1.0)
Monocytes Relative: 6 %
Neutro Abs: 6.7 10*3/uL (ref 1.7–7.7)
Neutrophils Relative %: 66 %
Platelets: 150 10*3/uL (ref 150–400)
RBC: 4.68 MIL/uL (ref 4.22–5.81)
RDW: 15.1 % (ref 11.5–15.5)
WBC: 10.1 10*3/uL (ref 4.0–10.5)
nRBC: 0 % (ref 0.0–0.2)

## 2020-09-21 LAB — COMPREHENSIVE METABOLIC PANEL
ALT: 35 U/L (ref 0–44)
AST: 31 U/L (ref 15–41)
Albumin: 4 g/dL (ref 3.5–5.0)
Alkaline Phosphatase: 87 U/L (ref 38–126)
Anion gap: 11 (ref 5–15)
BUN: 20 mg/dL (ref 8–23)
CO2: 27 mmol/L (ref 22–32)
Calcium: 10.1 mg/dL (ref 8.9–10.3)
Chloride: 99 mmol/L (ref 98–111)
Creatinine, Ser: 1.23 mg/dL (ref 0.61–1.24)
GFR, Estimated: >60 mL/min (ref >60)
Glucose, Bld: 113 mg/dL — ABNORMAL HIGH (ref 70–99)
Potassium: 4 mmol/L (ref 3.5–5.1)
Sodium: 137 mmol/L (ref 135–145)
Total Bilirubin: 0.9 mg/dL (ref 0.3–1.2)
Total Protein: 7 g/dL (ref 6.5–8.1)

## 2020-09-21 LAB — URINALYSIS, ROUTINE W REFLEX MICROSCOPIC
Bilirubin Urine: NEGATIVE
Glucose, UA: NEGATIVE mg/dL
Hgb urine dipstick: NEGATIVE
Ketones, ur: NEGATIVE mg/dL
Leukocytes,Ua: NEGATIVE
Nitrite: NEGATIVE
Protein, ur: NEGATIVE mg/dL
Specific Gravity, Urine: 1.004 — ABNORMAL LOW (ref 1.005–1.030)
pH: 6 (ref 5.0–8.0)

## 2020-09-21 LAB — RESPIRATORY PANEL BY RT PCR (FLU A&B, COVID)
Influenza A by PCR: NEGATIVE
Influenza B by PCR: NEGATIVE
SARS Coronavirus 2 by RT PCR: NEGATIVE

## 2020-09-21 NOTE — ED Triage Notes (Signed)
Patient arrived with EMS from home reports feeling cold and hot this evening , denies fever , respirations unlabored .

## 2020-09-21 NOTE — ED Notes (Signed)
Dc instructions reviewed with patient and pt verbalized understanding. Patient was wheeled out in wheelchair and portable o2 tank.
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
## 2020-09-21 NOTE — ED Notes (Signed)
covid swab obtained and walked to lab

## 2020-09-21 NOTE — Discharge Instructions (Addendum)
You were seen in the ER today for chills.  Your work-up in the ER was overall reassuring - your labs did not show any significant abnormalities. Your chest x-ray did not show pneumonia.  Your urine did not show a UTI. We tested you for COVID- we will call you if positive, if positive you will need to isolate.   Please follow up with your primary care provider within 3 days. Return to the ER for new or worsening symptoms including but not limited to fever, chest pain, trouble breathing,

## 2020-09-30 ENCOUNTER — Ambulatory Visit: Payer: Medicare Other | Attending: Internal Medicine | Admitting: Pharmacist

## 2020-09-30 ENCOUNTER — Other Ambulatory Visit: Payer: Self-pay

## 2020-09-30 DIAGNOSIS — Z23 Encounter for immunization: Secondary | ICD-10-CM

## 2020-09-30 NOTE — Progress Notes (Signed)
Patient presents for vaccination against strep pneumo per orders of Dr. Wynetta Emery. Consent given. Counseling provided. No contraindications exists. Vaccine administered without incident.   George Robbins, PharmD, [place] [phone]

## 2020-10-13 ENCOUNTER — Inpatient Hospital Stay: Payer: Medicare Other | Admitting: Internal Medicine

## 2020-10-23 ENCOUNTER — Other Ambulatory Visit: Payer: Self-pay | Admitting: Family Medicine

## 2020-10-23 DIAGNOSIS — R350 Frequency of micturition: Secondary | ICD-10-CM

## 2020-10-28 ENCOUNTER — Ambulatory Visit: Payer: Medicare Other | Attending: Internal Medicine | Admitting: Internal Medicine

## 2020-10-28 ENCOUNTER — Encounter (INDEPENDENT_AMBULATORY_CARE_PROVIDER_SITE_OTHER): Payer: Self-pay

## 2020-10-28 ENCOUNTER — Other Ambulatory Visit: Payer: Self-pay

## 2020-10-28 ENCOUNTER — Encounter: Payer: Self-pay | Admitting: Internal Medicine

## 2020-10-28 VITALS — BP 112/70 | HR 71 | Resp 16 | Wt 202.2 lb

## 2020-10-28 DIAGNOSIS — Z7982 Long term (current) use of aspirin: Secondary | ICD-10-CM | POA: Insufficient documentation

## 2020-10-28 DIAGNOSIS — I25119 Atherosclerotic heart disease of native coronary artery with unspecified angina pectoris: Secondary | ICD-10-CM | POA: Diagnosis not present

## 2020-10-28 DIAGNOSIS — N182 Chronic kidney disease, stage 2 (mild): Secondary | ICD-10-CM | POA: Insufficient documentation

## 2020-10-28 DIAGNOSIS — Z9981 Dependence on supplemental oxygen: Secondary | ICD-10-CM | POA: Insufficient documentation

## 2020-10-28 DIAGNOSIS — E039 Hypothyroidism, unspecified: Secondary | ICD-10-CM | POA: Diagnosis not present

## 2020-10-28 DIAGNOSIS — J449 Chronic obstructive pulmonary disease, unspecified: Secondary | ICD-10-CM | POA: Insufficient documentation

## 2020-10-28 DIAGNOSIS — Z0000 Encounter for general adult medical examination without abnormal findings: Secondary | ICD-10-CM | POA: Diagnosis not present

## 2020-10-28 DIAGNOSIS — I129 Hypertensive chronic kidney disease with stage 1 through stage 4 chronic kidney disease, or unspecified chronic kidney disease: Secondary | ICD-10-CM | POA: Diagnosis not present

## 2020-10-28 DIAGNOSIS — I2510 Atherosclerotic heart disease of native coronary artery without angina pectoris: Secondary | ICD-10-CM | POA: Diagnosis not present

## 2020-10-28 DIAGNOSIS — Z09 Encounter for follow-up examination after completed treatment for conditions other than malignant neoplasm: Secondary | ICD-10-CM | POA: Diagnosis not present

## 2020-10-28 DIAGNOSIS — E1122 Type 2 diabetes mellitus with diabetic chronic kidney disease: Secondary | ICD-10-CM | POA: Diagnosis not present

## 2020-10-28 DIAGNOSIS — Z7984 Long term (current) use of oral hypoglycemic drugs: Secondary | ICD-10-CM | POA: Insufficient documentation

## 2020-10-28 DIAGNOSIS — G4733 Obstructive sleep apnea (adult) (pediatric): Secondary | ICD-10-CM | POA: Insufficient documentation

## 2020-10-28 DIAGNOSIS — Z87891 Personal history of nicotine dependence: Secondary | ICD-10-CM | POA: Diagnosis not present

## 2020-10-28 DIAGNOSIS — Z6839 Body mass index (BMI) 39.0-39.9, adult: Secondary | ICD-10-CM | POA: Diagnosis not present

## 2020-10-28 DIAGNOSIS — Z79899 Other long term (current) drug therapy: Secondary | ICD-10-CM | POA: Insufficient documentation

## 2020-10-28 DIAGNOSIS — K219 Gastro-esophageal reflux disease without esophagitis: Secondary | ICD-10-CM | POA: Diagnosis not present

## 2020-10-28 DIAGNOSIS — Z8249 Family history of ischemic heart disease and other diseases of the circulatory system: Secondary | ICD-10-CM | POA: Diagnosis not present

## 2020-10-28 DIAGNOSIS — E669 Obesity, unspecified: Secondary | ICD-10-CM | POA: Diagnosis not present

## 2020-10-28 MED ORDER — NITROGLYCERIN 0.4 MG SL SUBL: 0.4000 mg | SUBLINGUAL_TABLET | SUBLINGUAL | 1 refills | Status: DC | PRN
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
## 2020-11-13 ENCOUNTER — Other Ambulatory Visit: Payer: Self-pay | Admitting: Internal Medicine

## 2020-11-13 DIAGNOSIS — I2510 Atherosclerotic heart disease of native coronary artery without angina pectoris: Secondary | ICD-10-CM

## 2020-11-13 DIAGNOSIS — E039 Hypothyroidism, unspecified: Secondary | ICD-10-CM

## 2020-12-14 ENCOUNTER — Other Ambulatory Visit: Payer: Self-pay | Admitting: Internal Medicine

## 2020-12-30 ENCOUNTER — Ambulatory Visit: Payer: Medicare Other | Attending: Internal Medicine | Admitting: Internal Medicine

## 2020-12-30 ENCOUNTER — Other Ambulatory Visit: Payer: Self-pay

## 2020-12-30 ENCOUNTER — Encounter: Payer: Self-pay | Admitting: Internal Medicine

## 2020-12-30 VITALS — BP 118/73 | HR 60 | Resp 16 | Wt 208.2 lb

## 2020-12-30 DIAGNOSIS — Z87891 Personal history of nicotine dependence: Secondary | ICD-10-CM | POA: Diagnosis not present

## 2020-12-30 DIAGNOSIS — I25119 Atherosclerotic heart disease of native coronary artery with unspecified angina pectoris: Secondary | ICD-10-CM | POA: Insufficient documentation

## 2020-12-30 DIAGNOSIS — I10 Essential (primary) hypertension: Secondary | ICD-10-CM

## 2020-12-30 DIAGNOSIS — I129 Hypertensive chronic kidney disease with stage 1 through stage 4 chronic kidney disease, or unspecified chronic kidney disease: Secondary | ICD-10-CM | POA: Diagnosis not present

## 2020-12-30 DIAGNOSIS — E785 Hyperlipidemia, unspecified: Secondary | ICD-10-CM | POA: Diagnosis not present

## 2020-12-30 DIAGNOSIS — Z9981 Dependence on supplemental oxygen: Secondary | ICD-10-CM | POA: Diagnosis not present

## 2020-12-30 DIAGNOSIS — E1122 Type 2 diabetes mellitus with diabetic chronic kidney disease: Secondary | ICD-10-CM | POA: Diagnosis not present

## 2020-12-30 DIAGNOSIS — Z7984 Long term (current) use of oral hypoglycemic drugs: Secondary | ICD-10-CM | POA: Insufficient documentation

## 2020-12-30 DIAGNOSIS — J449 Chronic obstructive pulmonary disease, unspecified: Secondary | ICD-10-CM | POA: Insufficient documentation

## 2020-12-30 DIAGNOSIS — Z7982 Long term (current) use of aspirin: Secondary | ICD-10-CM | POA: Insufficient documentation

## 2020-12-30 DIAGNOSIS — G4733 Obstructive sleep apnea (adult) (pediatric): Secondary | ICD-10-CM | POA: Insufficient documentation

## 2020-12-30 DIAGNOSIS — Z79899 Other long term (current) drug therapy: Secondary | ICD-10-CM | POA: Insufficient documentation

## 2020-12-30 DIAGNOSIS — R350 Frequency of micturition: Secondary | ICD-10-CM | POA: Diagnosis not present

## 2020-12-30 DIAGNOSIS — E039 Hypothyroidism, unspecified: Secondary | ICD-10-CM | POA: Insufficient documentation

## 2020-12-30 DIAGNOSIS — Z7902 Long term (current) use of antithrombotics/antiplatelets: Secondary | ICD-10-CM | POA: Diagnosis not present

## 2020-12-30 DIAGNOSIS — Z6836 Body mass index (BMI) 36.0-36.9, adult: Secondary | ICD-10-CM | POA: Diagnosis not present

## 2020-12-30 DIAGNOSIS — N182 Chronic kidney disease, stage 2 (mild): Secondary | ICD-10-CM | POA: Diagnosis not present

## 2020-12-30 DIAGNOSIS — J9611 Chronic respiratory failure with hypoxia: Secondary | ICD-10-CM | POA: Diagnosis not present

## 2020-12-30 DIAGNOSIS — N401 Enlarged prostate with lower urinary tract symptoms: Secondary | ICD-10-CM | POA: Diagnosis not present

## 2020-12-30 DIAGNOSIS — Z955 Presence of coronary angioplasty implant and graft: Secondary | ICD-10-CM | POA: Diagnosis not present

## 2020-12-30 DIAGNOSIS — Z7951 Long term (current) use of inhaled steroids: Secondary | ICD-10-CM | POA: Insufficient documentation

## 2020-12-30 MED ORDER — CLOPIDOGREL BISULFATE 75 MG PO TABS: ORAL_TABLET | ORAL | 2 refills | Status: DC

## 2020-12-30 MED ORDER — BISOPROLOL FUMARATE 5 MG PO TABS: 5.0000 mg | ORAL_TABLET | Freq: Every day | ORAL | 3 refills | Status: DC

## 2020-12-30 MED ORDER — EZETIMIBE 10 MG PO TABS: 10.0000 mg | ORAL_TABLET | Freq: Every day | ORAL | 3 refills | Status: DC

## 2020-12-30 MED ORDER — TRELEGY ELLIPTA 100-62.5-25 MCG/INH IN AEPB: INHALATION_SPRAY | RESPIRATORY_TRACT | 12 refills | Status: DC

## 2020-12-30 MED ORDER — LEVOTHYROXINE SODIUM 125 MCG PO TABS: 125.0000 ug | ORAL_TABLET | Freq: Every day | ORAL | 2 refills | Status: DC

## 2020-12-30 MED ORDER — PANTOPRAZOLE SODIUM 40 MG PO TBEC
40.0000 mg | DELAYED_RELEASE_TABLET | Freq: Every day | ORAL | 3 refills | Status: DC
Start: 2020-12-30 — End: 2021-09-20

## 2020-12-30 MED ORDER — ATORVASTATIN CALCIUM 40 MG PO TABS: ORAL_TABLET | ORAL | 2 refills | Status: DC

## 2020-12-30 MED ORDER — TAMSULOSIN HCL 0.4 MG PO CAPS: 0.4000 mg | ORAL_CAPSULE | Freq: Every day | ORAL | 2 refills | Status: DC
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
## 2021-01-02 ENCOUNTER — Ambulatory Visit: Payer: Medicare Other | Admitting: Internal Medicine

## 2021-01-25 ENCOUNTER — Telehealth: Payer: Self-pay | Admitting: Internal Medicine

## 2021-01-25 NOTE — Telephone Encounter (Signed)
Returned pt call and made aware that I have looked in his chart and there wasn't any documentation on anyone calling him. Pt states he understands and doesn't have any questions or concerns

## 2021-01-25 NOTE — Telephone Encounter (Signed)
Copied from [place] [phone]. Topic: General - Call Back - No Documentation >> Jan 24, 2021  5:42 PM Erick Blinks wrote: Reason for CRM: Pt would like a call back from the office, returned call after office. States he missed a call from PCP  Best contact: [phone]  Do not see where anyone called patient. Please follow up if appropriate.

## 2021-02-03 ENCOUNTER — Other Ambulatory Visit: Payer: Self-pay | Admitting: Internal Medicine

## 2021-02-03 DIAGNOSIS — J439 Emphysema, unspecified: Secondary | ICD-10-CM

## 2021-02-03 NOTE — Telephone Encounter (Signed)
Requested medication (s) are due for refill today: Yes  Requested medication (s) are on the active medication list: Yes  Last refill:  01/30/19  Future visit scheduled: Yes  Notes to clinic:  Unable to refill per protocol, Rx expired.      Requested Prescriptions  Pending Prescriptions Disp Refills   VENTOLIN HFA 108 (90 Base) MCG/ACT inhaler [Pharmacy Med Name: VENTOLIN HFA 90 MCG INHALER] 18 each 11    Sig: TAKE 2 PUFFS BY MOUTH EVERY 6 HOURS AS NEEDED FOR WHEEZE OR SHORTNESS OF BREATH      Pulmonology:  Beta Agonists Failed - 02/03/2021  4:29 PM      Failed - One inhaler should last at least one month. If the patient is requesting refills earlier, contact the patient to check for uncontrolled symptoms.      Passed - Valid encounter within last 12 months    Recent Outpatient Visits           1 month ago Essential hypertension   [place], MD   3 months ago Encounter for examination following treatment at [place], MD   4 months ago Need for Streptococcus pneumoniae vaccination   [place], Jarome Matin, RPH-CPP   5 months ago Need for influenza vaccination   [place], Jarome Matin, RPH-CPP   5 months ago Essential hypertension   [place], MD       Future Appointments             In 2 months Wynetta Emery Dalbert Batman, MD [place]

## 2021-02-17 ENCOUNTER — Other Ambulatory Visit: Payer: Self-pay | Admitting: Internal Medicine

## 2021-02-17 DIAGNOSIS — R7303 Prediabetes: Secondary | ICD-10-CM

## 2021-02-17 NOTE — Telephone Encounter (Signed)
Requested Prescriptions  Pending Prescriptions Disp Refills  . metFORMIN (GLUCOPHAGE) 500 MG tablet [Pharmacy Med Name: METFORMIN HCL 500 MG TABLET] 45 tablet 1    Sig: TAKE 1/2 TABLET EVERY DAY WITH BREAKFAST.     Endocrinology:  Diabetes - Biguanides Passed - 02/17/2021 12:41 PM      Passed - Cr in normal range and within 360 days    Creat  Date Value Ref Range Status  06/19/2016 1.56 (H) 0.70 - 1.25 mg/dL Final    Comment:      For patients > or = 69 years of age: The upper reference limit for Creatinine is approximately 13% higher for people identified as African-American.      Creatinine, Ser  Date Value Ref Range Status  09/21/2020 1.23 0.61 - 1.24 mg/dL Final         Passed - HBA1C is between 0 and 7.9 and within 180 days    HbA1c, POC (prediabetic range)  Date Value Ref Range Status  08/30/2020 6.0 5.7 - 6.4 % Final         Passed - AA eGFR in normal range and within 360 days    GFR, Est African American  Date Value Ref Range Status  06/19/2016 54 (L) >=60 mL/min Final   GFR calc Af Amer  Date Value Ref Range Status  07/24/2020 >60 >60 mL/min Final   GFR, Est Non African American  Date Value Ref Range Status  06/19/2016 47 (L) >=60 mL/min Final   GFR, Estimated  Date Value Ref Range Status  09/21/2020 >60 >60 mL/min Final    Comment:    (NOTE) Calculated using the CKD-EPI Creatinine Equation (2021)    GFR  Date Value Ref Range Status  02/28/2016 86.86 >60.00 mL/min Final         Passed - Valid encounter within last 6 months    Recent Outpatient Visits          1 month ago Essential hypertension   [facility] Johnson, Deborah B, MD   3 months ago Encounter for examination following treatment at hospital   [facility] Johnson, Deborah B, MD   4 months ago Need for Streptococcus pneumoniae vaccination   [facility] Van Ausdall, Stephen L, RPH-CPP   5 months  ago Need for influenza vaccination   [facility] Van Ausdall, Stephen L, RPH-CPP   5 months ago Essential hypertension   [facility] Johnson, Deborah B, MD      Future Appointments            In 2 months Johnson, Deborah B, MD [facility]           . fluticasone (FLONASE) 50 MCG/ACT nasal spray [Pharmacy Med Name: FLUTICASONE PROP 50 MCG SPRAY] 48 mL 1    Sig: USE 2 SPRAYS INTO EACH NOSTRIL ONCE DAILY     Ear, Nose, and Throat: Nasal Preparations - Corticosteroids Passed - 02/17/2021 12:41 PM      Passed - Valid encounter within last 12 months    Recent Outpatient Visits          1 month ago Essential hypertension   [facility] Johnson, Deborah B, MD   3 months ago Encounter for examination following treatment at hospital   [facility] Johnson, Deborah B, MD   4   months ago Need for Streptococcus pneumoniae vaccination   [place], Jarome Matin, RPH-CPP   5 months ago Need for influenza vaccination   [place], Jarome Matin, RPH-CPP   5 months ago Essential hypertension   [place], MD      Future Appointments            In 2 months Wynetta Emery Dalbert Batman, MD [place]

## 2021-04-27 ENCOUNTER — Other Ambulatory Visit: Payer: Self-pay | Admitting: Internal Medicine

## 2021-04-27 DIAGNOSIS — R7303 Prediabetes: Secondary | ICD-10-CM

## 2021-04-27 NOTE — Telephone Encounter (Signed)
Requested medications are due for refill today.  yes  Requested medications are on the active medications list.  yes  Last refill. 05/17/2020  Future visit scheduled.   yes  Notes to clinic.  Patient has 2 prescriptions for metformin. Please advise.

## 2021-04-28 ENCOUNTER — Ambulatory Visit: Payer: Medicare Other | Admitting: Internal Medicine

## 2021-06-16 ENCOUNTER — Other Ambulatory Visit: Payer: Self-pay | Admitting: Internal Medicine

## 2021-06-27 ENCOUNTER — Ambulatory Visit: Payer: Medicare Other | Admitting: Internal Medicine

## 2021-07-05 ENCOUNTER — Other Ambulatory Visit: Payer: Self-pay

## 2021-07-05 ENCOUNTER — Ambulatory Visit (INDEPENDENT_AMBULATORY_CARE_PROVIDER_SITE_OTHER): Payer: Medicare Other | Admitting: Podiatry

## 2021-07-05 DIAGNOSIS — M79675 Pain in left toe(s): Secondary | ICD-10-CM | POA: Diagnosis not present

## 2021-07-05 DIAGNOSIS — M79674 Pain in right toe(s): Secondary | ICD-10-CM

## 2021-07-05 DIAGNOSIS — B351 Tinea unguium: Secondary | ICD-10-CM | POA: Diagnosis not present

## 2021-07-07 NOTE — Progress Notes (Signed)
  Subjective:  Patient ID: George Robbins, male    DOB: Feb 29, 1951,  MRN: [id number]  Chief Complaint  Patient presents with   Nail Problem    New patient Routine foot care nail trim    70 y.o. male returns for the above complaint.  Patient presents with thickened elongated dystrophic toenails x10.  Patient like to have them debrided down his not able to do himself.  He denies any other acute complaints.  He is prediabetic  Objective:  There were no vitals filed for this visit. Podiatric Exam: Vascular: dorsalis pedis and posterior tibial pulses are palpable bilateral. Capillary return is immediate. Temperature gradient is WNL. Skin turgor WNL  Sensorium: Normal Semmes Weinstein monofilament test. Normal tactile sensation bilaterally. Nail Exam: Pt has thick disfigured discolored nails with subungual debris noted bilateral entire nail hallux through fifth toenails.  Pain on palpation to the nails. Ulcer Exam: There is no evidence of ulcer or pre-ulcerative changes or infection. Orthopedic Exam: Muscle tone and strength are WNL. No limitations in general ROM. No crepitus or effusions noted. HAV  B/L.  Hammer toes 2-5  B/L. Skin: No Porokeratosis. No infection or ulcers    Assessment & Plan:   1. Pain due to onychomycosis of toenails of both feet     Patient was evaluated and treated and all questions answered.  Onychomycosis with pain  -Nails palliatively debrided as below. -Educated on self-care  Procedure: Nail Debridement Rationale: pain  Type of Debridement: manual, sharp debridement. Instrumentation: Nail nipper, rotary burr. Number of Nails: 10  Procedures and Treatment: Consent by patient was obtained for treatment procedures. The patient understood the discussion of treatment and procedures well. All questions were answered thoroughly reviewed. Debridement of mycotic and hypertrophic toenails, 1 through 5 bilateral and clearing of subungual debris. No ulceration, no  infection noted.  Return Visit-Office Procedure: Patient instructed to return to the office for a follow up visit 3 months for continued evaluation and treatment.  Boneta Lucks, DPM    No follow-ups on file.

## 2021-07-14 ENCOUNTER — Other Ambulatory Visit: Payer: Self-pay | Admitting: Internal Medicine

## 2021-07-14 DIAGNOSIS — R7303 Prediabetes: Secondary | ICD-10-CM

## 2021-07-14 DIAGNOSIS — E039 Hypothyroidism, unspecified: Secondary | ICD-10-CM

## 2021-08-04 ENCOUNTER — Ambulatory Visit: Payer: Medicare Other | Admitting: Podiatry

## 2021-09-20 ENCOUNTER — Encounter: Payer: Self-pay | Admitting: Physician Assistant

## 2021-09-20 ENCOUNTER — Other Ambulatory Visit: Payer: Self-pay

## 2021-09-20 ENCOUNTER — Ambulatory Visit: Payer: Medicare Other | Attending: Physician Assistant | Admitting: Physician Assistant

## 2021-09-20 VITALS — BP 120/73 | HR 69 | Resp 16 | Wt 200.8 lb

## 2021-09-20 DIAGNOSIS — R7303 Prediabetes: Secondary | ICD-10-CM

## 2021-09-20 DIAGNOSIS — R350 Frequency of micturition: Secondary | ICD-10-CM

## 2021-09-20 DIAGNOSIS — J439 Emphysema, unspecified: Secondary | ICD-10-CM | POA: Diagnosis not present

## 2021-09-20 DIAGNOSIS — H1013 Acute atopic conjunctivitis, bilateral: Secondary | ICD-10-CM | POA: Diagnosis not present

## 2021-09-20 DIAGNOSIS — E039 Hypothyroidism, unspecified: Secondary | ICD-10-CM

## 2021-09-20 DIAGNOSIS — N401 Enlarged prostate with lower urinary tract symptoms: Secondary | ICD-10-CM | POA: Diagnosis not present

## 2021-09-20 DIAGNOSIS — I25119 Atherosclerotic heart disease of native coronary artery with unspecified angina pectoris: Secondary | ICD-10-CM | POA: Diagnosis not present

## 2021-09-20 DIAGNOSIS — I10 Essential (primary) hypertension: Secondary | ICD-10-CM | POA: Diagnosis not present

## 2021-09-20 DIAGNOSIS — E785 Hyperlipidemia, unspecified: Secondary | ICD-10-CM | POA: Diagnosis not present

## 2021-09-20 LAB — GLUCOSE, POCT (MANUAL RESULT ENTRY): POC Glucose: 99 mg/dl (ref 70–99)

## 2021-09-20 MED ORDER — LEVOTHYROXINE SODIUM 125 MCG PO TABS: 125.0000 ug | ORAL_TABLET | Freq: Every day | ORAL | 2 refills | Status: DC

## 2021-09-20 MED ORDER — PANTOPRAZOLE SODIUM 40 MG PO TBEC: 40.0000 mg | DELAYED_RELEASE_TABLET | Freq: Every day | ORAL | 3 refills | Status: DC

## 2021-09-20 MED ORDER — OLOPATADINE HCL 0.1 % OP SOLN: 1.0000 [drp] | Freq: Every day | OPHTHALMIC | 3 refills | Status: DC | PRN

## 2021-09-20 MED ORDER — CLOPIDOGREL BISULFATE 75 MG PO TABS: ORAL_TABLET | ORAL | 2 refills | Status: DC

## 2021-09-20 MED ORDER — FLUTICASONE PROPIONATE 50 MCG/ACT NA SUSP: NASAL | 1 refills | Status: DC

## 2021-09-20 MED ORDER — BISOPROLOL FUMARATE 5 MG PO TABS: 5.0000 mg | ORAL_TABLET | Freq: Every day | ORAL | 3 refills | Status: DC

## 2021-09-20 MED ORDER — TRELEGY ELLIPTA 100-62.5-25 MCG/ACT IN AEPB: 1.0000 | INHALATION_SPRAY | Freq: Every day | RESPIRATORY_TRACT | 5 refills | Status: DC

## 2021-09-20 MED ORDER — TAMSULOSIN HCL 0.4 MG PO CAPS: 0.4000 mg | ORAL_CAPSULE | Freq: Every day | ORAL | 2 refills | Status: DC

## 2021-09-20 MED ORDER — ATORVASTATIN CALCIUM 40 MG PO TABS: ORAL_TABLET | ORAL | 2 refills | Status: DC

## 2021-09-20 MED ORDER — VENTOLIN HFA 108 (90 BASE) MCG/ACT IN AERS: INHALATION_SPRAY | RESPIRATORY_TRACT | 11 refills | Status: DC

## 2021-09-20 MED ORDER — EZETIMIBE 10 MG PO TABS: 10.0000 mg | ORAL_TABLET | Freq: Every day | ORAL | 3 refills | Status: DC

## 2021-09-20 MED ORDER — METFORMIN HCL 500 MG PO TABS: ORAL_TABLET | ORAL | 0 refills | Status: DC
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
## 2021-09-21 LAB — CBC WITH DIFFERENTIAL/PLATELET
Basophils Absolute: 0 10*3/uL (ref 0.0–0.2)
Basos: 0 %
EOS (ABSOLUTE): 0.2 10*3/uL (ref 0.0–0.4)
Eos: 2 %
Hematocrit: 39 % (ref 37.5–51.0)
Hemoglobin: 12.9 g/dL — ABNORMAL LOW (ref 13.0–17.7)
Immature Grans (Abs): 0 10*3/uL (ref 0.0–0.1)
Immature Granulocytes: 0 %
Lymphocytes Absolute: 1.7 10*3/uL (ref 0.7–3.1)
Lymphs: 19 %
MCH: 28.9 pg (ref 26.6–33.0)
MCHC: 33.1 g/dL (ref 31.5–35.7)
MCV: 87 fL (ref 79–97)
Monocytes Absolute: 0.7 10*3/uL (ref 0.1–0.9)
Monocytes: 7 %
Neutrophils Absolute: 6.4 10*3/uL (ref 1.4–7.0)
Neutrophils: 72 %
Platelets: 172 10*3/uL (ref 150–450)
RBC: 4.47 x10E6/uL (ref 4.14–5.80)
RDW: 14.4 % (ref 11.6–15.4)
WBC: 9.1 10*3/uL (ref 3.4–10.8)

## 2021-09-21 LAB — HEMOGLOBIN A1C
Est. average glucose Bld gHb Est-mCnc: 126 mg/dL
Hgb A1c MFr Bld: 6 % — ABNORMAL HIGH (ref 4.8–5.6)

## 2021-09-21 LAB — COMPREHENSIVE METABOLIC PANEL
ALT: 25 IU/L (ref 0–44)
AST: 18 IU/L (ref 0–40)
Albumin/Globulin Ratio: 1.7 (ref 1.2–2.2)
Albumin: 4.5 g/dL (ref 3.8–4.8)
Alkaline Phosphatase: 96 IU/L (ref 44–121)
BUN/Creatinine Ratio: 9 — ABNORMAL LOW (ref 10–24)
BUN: 10 mg/dL (ref 8–27)
Bilirubin Total: 0.4 mg/dL (ref 0.0–1.2)
CO2: 26 mmol/L (ref 20–29)
Calcium: 10.4 mg/dL — ABNORMAL HIGH (ref 8.6–10.2)
Chloride: 100 mmol/L (ref 96–106)
Creatinine, Ser: 1.16 mg/dL (ref 0.76–1.27)
Globulin, Total: 2.6 g/dL (ref 1.5–4.5)
Glucose: 87 mg/dL (ref 70–99)
Potassium: 4.1 mmol/L (ref 3.5–5.2)
Sodium: 141 mmol/L (ref 134–144)
Total Protein: 7.1 g/dL (ref 6.0–8.5)
eGFR: 69 mL/min/{1.73_m2} (ref >59)

## 2021-09-21 LAB — LIPID PANEL
Chol/HDL Ratio: 2.4 ratio (ref 0.0–5.0)
Cholesterol, Total: 131 mg/dL (ref 100–199)
HDL: 54 mg/dL (ref >39)
LDL Chol Calc (NIH): 60 mg/dL (ref 0–99)
Triglycerides: 92 mg/dL (ref 0–149)
VLDL Cholesterol Cal: 17 mg/dL (ref 5–40)

## 2021-09-21 LAB — THYROID PANEL WITH TSH
Free Thyroxine Index: 3 (ref 1.2–4.9)
T3 Uptake Ratio: 31 % (ref 24–39)
T4, Total: 9.6 ug/dL (ref 4.5–12.0)
TSH: 1.25 u[IU]/mL (ref 0.450–4.500)

## 2021-11-24 ENCOUNTER — Other Ambulatory Visit: Payer: Self-pay | Admitting: Internal Medicine

## 2021-11-24 DIAGNOSIS — R7303 Prediabetes: Secondary | ICD-10-CM

## 2021-11-24 NOTE — Telephone Encounter (Signed)
Requested Prescriptions  Pending Prescriptions Disp Refills   metFORMIN (GLUCOPHAGE) 500 MG tablet [Pharmacy Med Name: METFORMIN HCL 500 MG TABLET] 45 tablet 1    Sig: TAKE 1/2 TABLET EVERY DAY WITH BREAKFAST.     Endocrinology:  Diabetes - Biguanides Failed - 11/24/2021  3:30 PM      Failed - AA eGFR in normal range and within 360 days    GFR, Est African American  Date Value Ref Range Status  06/19/2016 54 (L) >=60 mL/min Final   GFR calc Af Amer  Date Value Ref Range Status  07/24/2020 >60 >60 mL/min Final   GFR, Est Non African American  Date Value Ref Range Status  06/19/2016 47 (L) >=60 mL/min Final   GFR, Estimated  Date Value Ref Range Status  09/21/2020 >60 >60 mL/min Final    Comment:    (NOTE) Calculated using the CKD-EPI Creatinine Equation (2021)    GFR  Date Value Ref Range Status  02/28/2016 86.86 >60.00 mL/min Final   eGFR  Date Value Ref Range Status  09/20/2021 69 >59 mL/min/1.73 Final         Passed - Cr in normal range and within 360 days    Creat  Date Value Ref Range Status  06/19/2016 1.56 (H) 0.70 - 1.25 mg/dL Final    Comment:      For patients > or = 69 years of age: The upper reference limit for Creatinine is approximately 13% higher for people identified as African-American.      Creatinine, Ser  Date Value Ref Range Status  09/20/2021 1.16 0.76 - 1.27 mg/dL Final         Passed - HBA1C is between 0 and 7.9 and within 180 days    HbA1c, POC (prediabetic range)  Date Value Ref Range Status  08/30/2020 6.0 5.7 - 6.4 % Final   Hgb A1c MFr Bld  Date Value Ref Range Status  09/20/2021 6.0 (H) 4.8 - 5.6 % Final    Comment:             Prediabetes: 5.7 - 6.4          Diabetes: >6.4          Glycemic control for adults with diabetes: <7.0          Passed - Valid encounter within last 6 months    Recent Outpatient Visits          2 months ago Prediabetes   [place], [place], [place]    10 months ago Essential hypertension   [place], Deborah B, MD   1 year ago Encounter for examination following treatment at [facility], Deborah B, MD   1 year ago Need for Streptococcus pneumoniae vaccination   [place], [facility]   1 year ago Need for influenza vaccination   [place], [facility]      Future Appointments            In 4 months Wynetta Emery, Dalbert Batman, MD [place]

## 2021-12-13 ENCOUNTER — Ambulatory Visit: Payer: Medicare Other | Admitting: Podiatry

## 2021-12-25 DIAGNOSIS — J449 Chronic obstructive pulmonary disease, unspecified: Secondary | ICD-10-CM | POA: Diagnosis not present

## 2022-01-23 DIAGNOSIS — J449 Chronic obstructive pulmonary disease, unspecified: Secondary | ICD-10-CM | POA: Diagnosis not present

## 2022-02-22 DIAGNOSIS — J449 Chronic obstructive pulmonary disease, unspecified: Secondary | ICD-10-CM | POA: Diagnosis not present

## 2022-03-02 DIAGNOSIS — G4733 Obstructive sleep apnea (adult) (pediatric): Secondary | ICD-10-CM | POA: Diagnosis not present

## 2022-03-16 ENCOUNTER — Telehealth: Payer: Medicare Other

## 2022-03-19 ENCOUNTER — Ambulatory Visit: Payer: Medicare Other | Admitting: Internal Medicine

## 2022-03-23 ENCOUNTER — Ambulatory Visit: Payer: Medicare HMO | Attending: Internal Medicine | Admitting: *Deleted

## 2022-03-23 DIAGNOSIS — Z0000 Encounter for general adult medical examination without abnormal findings: Secondary | ICD-10-CM | POA: Diagnosis not present

## 2022-03-23 NOTE — Patient Instructions (Signed)
Health Maintenance, Male Adopting a healthy lifestyle and getting preventive care are important in promoting health and wellness. Ask your health care provider about: The right schedule for you to have regular tests and exams. Things you can do on your own to prevent diseases and keep yourself healthy. What should I know about diet, weight, and exercise? Eat a healthy diet  Eat a diet that includes plenty of vegetables, fruits, low-fat dairy products, and lean protein. Do not eat a lot of foods that are high in solid fats, added sugars, or sodium. Maintain a healthy weight Body mass index (BMI) is a measurement that can be used to identify possible weight problems. It estimates body fat based on height and weight. Your health care provider can help determine your BMI and help you achieve or maintain a healthy weight. Get regular exercise Get regular exercise. This is one of the most important things you can do for your health. Most adults should: Exercise for at least 150 minutes each week. The exercise should increase your heart rate and make you sweat (moderate-intensity exercise). Do strengthening exercises at least twice a week. This is in addition to the moderate-intensity exercise. Spend less time sitting. Even light physical activity can be beneficial. Watch cholesterol and blood lipids Have your blood tested for lipids and cholesterol at 70 years of age, then have this test every 5 years. You may need to have your cholesterol levels checked more often if: Your lipid or cholesterol levels are high. You are older than 70 years of age. You are at high risk for heart disease. What should I know about cancer screening? Many types of cancers can be detected early and may often be prevented. Depending on your health history and family history, you may need to have cancer screening at various ages. This may include screening for: Colorectal cancer. Prostate cancer. Skin cancer. Lung  cancer. What should I know about heart disease, diabetes, and high blood pressure? Blood pressure and heart disease High blood pressure causes heart disease and increases the risk of stroke. This is more likely to develop in people who have high blood pressure readings or are overweight. Talk with your health care provider about your target blood pressure readings. Have your blood pressure checked: Every 3-5 years if you are 18-39 years of age. Every year if you are 40 years old or older. If you are between the ages of 65 and 75 and are a current or former smoker, ask your health care provider if you should have a one-time screening for abdominal aortic aneurysm (AAA). Diabetes Have regular diabetes screenings. This checks your fasting blood sugar level. Have the screening done: Once every three years after age 45 if you are at a normal weight and have a low risk for diabetes. More often and at a younger age if you are overweight or have a high risk for diabetes. What should I know about preventing infection? Hepatitis B If you have a higher risk for hepatitis B, you should be screened for this virus. Talk with your health care provider to find out if you are at risk for hepatitis B infection. Hepatitis C Blood testing is recommended for: Everyone born from 1945 through 1965. Anyone with known risk factors for hepatitis C. Sexually transmitted infections (STIs) You should be screened each year for STIs, including gonorrhea and chlamydia, if: You are sexually active and are younger than 70 years of age. You are older than 70 years of age and your   health care provider tells you that you are at risk for this type of infection. Your sexual activity has changed since you were last screened, and you are at increased risk for chlamydia or gonorrhea. Ask your health care provider if you are at risk. Ask your health care provider about whether you are at high risk for HIV. Your health care provider  may recommend a prescription medicine to help prevent HIV infection. If you choose to take medicine to prevent HIV, you should first get tested for HIV. You should then be tested every 3 months for as long as you are taking the medicine. Follow these instructions at home: Alcohol use Do not drink alcohol if your health care provider tells you not to drink. If you drink alcohol: Limit how much you have to 0-2 drinks a day. Know how much alcohol is in your drink. In the [place] one drink equals one 12 oz bottle of beer (355 mL), one 5 oz glass of wine (148 mL), or one 1 oz glass of hard liquor (44 mL). Lifestyle Do not use any products that contain nicotine or tobacco. These products include cigarettes, chewing tobacco, and vaping devices, such as e-cigarettes. If you need help quitting, ask your health care provider. Do not use street drugs. Do not share needles. Ask your health care provider for help if you need support or information about quitting drugs. General instructions Schedule regular health, dental, and eye exams. Stay current with your vaccines. Tell your health care provider if: You often feel depressed. You have ever been abused or do not feel safe at home. Summary Adopting a healthy lifestyle and getting preventive care are important in promoting health and wellness. Follow your health care provider's instructions about healthy diet, exercising, and getting tested or screened for diseases. Follow your health care provider's instructions on monitoring your cholesterol and blood pressure. This information is not intended to replace advice given to you by your health care provider. Make sure you discuss any questions you have with your health care provider. Document Revised: 04/03/2021 Document Reviewed: 04/03/2021 Elsevier Patient Education  2023 [employer]  
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
## 2022-03-25 DIAGNOSIS — J449 Chronic obstructive pulmonary disease, unspecified: Secondary | ICD-10-CM | POA: Diagnosis not present

## 2022-03-26 ENCOUNTER — Ambulatory Visit: Payer: Medicare Other | Admitting: Internal Medicine

## 2022-04-13 ENCOUNTER — Encounter: Payer: Self-pay | Admitting: Internal Medicine

## 2022-04-13 ENCOUNTER — Ambulatory Visit: Payer: Medicare Other | Admitting: Internal Medicine

## 2022-04-13 ENCOUNTER — Ambulatory Visit: Payer: Medicare HMO | Attending: Internal Medicine | Admitting: Internal Medicine

## 2022-04-13 VITALS — BP 125/80 | HR 62 | Wt 194.8 lb

## 2022-04-13 DIAGNOSIS — J9611 Chronic respiratory failure with hypoxia: Secondary | ICD-10-CM | POA: Diagnosis not present

## 2022-04-13 DIAGNOSIS — Z1211 Encounter for screening for malignant neoplasm of colon: Secondary | ICD-10-CM | POA: Diagnosis not present

## 2022-04-13 DIAGNOSIS — Z23 Encounter for immunization: Secondary | ICD-10-CM | POA: Diagnosis not present

## 2022-04-13 DIAGNOSIS — Z9989 Dependence on other enabling machines and devices: Secondary | ICD-10-CM

## 2022-04-13 DIAGNOSIS — G4733 Obstructive sleep apnea (adult) (pediatric): Secondary | ICD-10-CM

## 2022-04-13 DIAGNOSIS — I10 Essential (primary) hypertension: Secondary | ICD-10-CM | POA: Diagnosis not present

## 2022-04-13 DIAGNOSIS — E039 Hypothyroidism, unspecified: Secondary | ICD-10-CM | POA: Diagnosis not present

## 2022-04-13 DIAGNOSIS — E66811 Obesity, class 1: Secondary | ICD-10-CM

## 2022-04-13 DIAGNOSIS — E669 Obesity, unspecified: Secondary | ICD-10-CM | POA: Diagnosis not present

## 2022-04-13 DIAGNOSIS — Z9981 Dependence on supplemental oxygen: Secondary | ICD-10-CM | POA: Diagnosis not present

## 2022-04-13 DIAGNOSIS — J439 Emphysema, unspecified: Secondary | ICD-10-CM | POA: Diagnosis not present

## 2022-04-13 DIAGNOSIS — I2510 Atherosclerotic heart disease of native coronary artery without angina pectoris: Secondary | ICD-10-CM | POA: Diagnosis not present

## 2022-04-13 MED ORDER — ZOSTER VAC RECOMB ADJUVANTED 50 MCG/0.5ML IM SUSR: 0.5000 mL | Freq: Once | INTRAMUSCULAR | 0 refills | Status: AC
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
## 2022-04-14 ENCOUNTER — Other Ambulatory Visit: Payer: Self-pay | Admitting: Internal Medicine

## 2022-04-14 DIAGNOSIS — Z125 Encounter for screening for malignant neoplasm of prostate: Secondary | ICD-10-CM

## 2022-04-14 LAB — COMPREHENSIVE METABOLIC PANEL
ALT: 27 IU/L (ref 0–44)
AST: 17 IU/L (ref 0–40)
Albumin/Globulin Ratio: 2.2 (ref 1.2–2.2)
Albumin: 5 g/dL — ABNORMAL HIGH (ref 3.8–4.8)
Alkaline Phosphatase: 99 IU/L (ref 44–121)
BUN/Creatinine Ratio: 13 (ref 10–24)
BUN: 14 mg/dL (ref 8–27)
Bilirubin Total: 0.3 mg/dL (ref 0.0–1.2)
CO2: 26 mmol/L (ref 20–29)
Calcium: 10.6 mg/dL — ABNORMAL HIGH (ref 8.6–10.2)
Chloride: 99 mmol/L (ref 96–106)
Creatinine, Ser: 1.09 mg/dL (ref 0.76–1.27)
Globulin, Total: 2.3 g/dL (ref 1.5–4.5)
Glucose: 83 mg/dL (ref 70–99)
Potassium: 4.3 mmol/L (ref 3.5–5.2)
Sodium: 141 mmol/L (ref 134–144)
Total Protein: 7.3 g/dL (ref 6.0–8.5)
eGFR: 73 mL/min/{1.73_m2} (ref >59)

## 2022-04-14 LAB — CBC
Hematocrit: 39.4 % (ref 37.5–51.0)
Hemoglobin: 13.4 g/dL (ref 13.0–17.7)
MCH: 29.4 pg (ref 26.6–33.0)
MCHC: 34 g/dL (ref 31.5–35.7)
MCV: 86 fL (ref 79–97)
Platelets: 160 10*3/uL (ref 150–450)
RBC: 4.56 x10E6/uL (ref 4.14–5.80)
RDW: 13.9 % (ref 11.6–15.4)
WBC: 9.1 10*3/uL (ref 3.4–10.8)

## 2022-04-14 LAB — TSH: TSH: 0.747 u[IU]/mL (ref 0.450–4.500)

## 2022-04-14 LAB — HEMOGLOBIN A1C
Est. average glucose Bld gHb Est-mCnc: 120 mg/dL
Hgb A1c MFr Bld: 5.8 % — ABNORMAL HIGH (ref 4.8–5.6)

## 2022-04-14 NOTE — Progress Notes (Signed)
Let patient know that he is still in the range for prediabetes.  Continue metformin and trying to eat healthy.  Kidney and liver function tests are good.  Calcium level is elevated.  Please return to the lab at his earliest convenience to have additional blood test done to evaluate this further.  Thyroid level is normal.  Blood cell counts are normal.

## 2022-04-24 DIAGNOSIS — J449 Chronic obstructive pulmonary disease, unspecified: Secondary | ICD-10-CM | POA: Diagnosis not present

## 2022-05-04 DIAGNOSIS — Z7902 Long term (current) use of antithrombotics/antiplatelets: Secondary | ICD-10-CM | POA: Diagnosis not present

## 2022-05-04 DIAGNOSIS — I10 Essential (primary) hypertension: Secondary | ICD-10-CM | POA: Diagnosis not present

## 2022-05-04 DIAGNOSIS — N529 Male erectile dysfunction, unspecified: Secondary | ICD-10-CM | POA: Diagnosis not present

## 2022-05-04 DIAGNOSIS — E119 Type 2 diabetes mellitus without complications: Secondary | ICD-10-CM | POA: Diagnosis not present

## 2022-05-04 DIAGNOSIS — N400 Enlarged prostate without lower urinary tract symptoms: Secondary | ICD-10-CM | POA: Diagnosis not present

## 2022-05-04 DIAGNOSIS — I252 Old myocardial infarction: Secondary | ICD-10-CM | POA: Diagnosis not present

## 2022-05-04 DIAGNOSIS — E669 Obesity, unspecified: Secondary | ICD-10-CM | POA: Diagnosis not present

## 2022-05-04 DIAGNOSIS — Z6834 Body mass index (BMI) 34.0-34.9, adult: Secondary | ICD-10-CM | POA: Diagnosis not present

## 2022-05-04 DIAGNOSIS — E785 Hyperlipidemia, unspecified: Secondary | ICD-10-CM | POA: Diagnosis not present

## 2022-05-04 DIAGNOSIS — E039 Hypothyroidism, unspecified: Secondary | ICD-10-CM | POA: Diagnosis not present

## 2022-05-04 DIAGNOSIS — K219 Gastro-esophageal reflux disease without esophagitis: Secondary | ICD-10-CM | POA: Diagnosis not present

## 2022-05-04 DIAGNOSIS — J449 Chronic obstructive pulmonary disease, unspecified: Secondary | ICD-10-CM | POA: Diagnosis not present

## 2022-05-18 ENCOUNTER — Ambulatory Visit: Payer: Medicare HMO | Admitting: Podiatry

## 2022-05-18 ENCOUNTER — Encounter: Payer: Self-pay | Admitting: Podiatry

## 2022-05-18 DIAGNOSIS — M79675 Pain in left toe(s): Secondary | ICD-10-CM | POA: Diagnosis not present

## 2022-05-18 DIAGNOSIS — M79674 Pain in right toe(s): Secondary | ICD-10-CM | POA: Diagnosis not present

## 2022-05-18 DIAGNOSIS — B351 Tinea unguium: Secondary | ICD-10-CM | POA: Diagnosis not present

## 2022-05-24 DIAGNOSIS — G4733 Obstructive sleep apnea (adult) (pediatric): Secondary | ICD-10-CM | POA: Diagnosis not present

## 2022-05-25 DIAGNOSIS — J449 Chronic obstructive pulmonary disease, unspecified: Secondary | ICD-10-CM | POA: Diagnosis not present

## 2022-06-12 ENCOUNTER — Other Ambulatory Visit: Payer: Self-pay | Admitting: Physician Assistant

## 2022-06-12 DIAGNOSIS — N401 Enlarged prostate with lower urinary tract symptoms: Secondary | ICD-10-CM

## 2022-06-12 NOTE — Telephone Encounter (Signed)
Requested medication (s) are for refill today: yes  Requested medication (s) are on the active medication list: yes  Last refill:  09/20/21 #90 with 2 RF  Future visit scheduled: no, seen 04/13/22 (LABS ordered at last visit in May but not drawn.)  Notes to clinic: Failed protocol of labs within 12 months, PSA (ordered, not drawn), no upcoming appt, was seen recently, please assess.  Requested Prescriptions  Pending Prescriptions Disp Refills   tamsulosin (FLOMAX) 0.4 MG CAPS capsule [Pharmacy Med Name: TAMSULOSIN HCL 0.4 MG CAPSULE] 90 capsule 2    Sig: TAKE 1 CAPSULE BY MOUTH EVERY DAY     Urology: Alpha-Adrenergic Blocker Failed - 06/12/2022 12:12 AM      Failed - PSA in normal range and within 360 days    PSA  Date Value Ref Range Status  10/08/2013 0.53 <=4.00 ng/mL Final    Comment:    Test Methodology: ECLIA PSA (Electrochemiluminescence Immunoassay)   For PSA values from 2.5-4.0, particularly in younger men <38 years old, the AUA and NCCN suggest testing for % Free PSA (3515) and evaluation of the rate of increase in PSA (PSA velocity).   Prostate Specific Ag, Serum  Date Value Ref Range Status  02/14/2018 0.9 0.0 - 4.0 ng/mL Final    Comment:    Roche ECLIA methodology. According to the American Urological Association, Serum PSA should decrease and remain at undetectable levels after radical prostatectomy. The AUA defines biochemical recurrence as an initial PSA value 0.2 ng/mL or greater followed by a subsequent confirmatory PSA value 0.2 ng/mL or greater. Values obtained with different assay methods or kits cannot be used interchangeably. Results cannot be interpreted as absolute evidence of the presence or absence of malignant disease.          Passed - Last BP in normal range    BP Readings from Last 1 Encounters:  04/13/22 125/80         Passed - Valid encounter within last 12 months    Recent Outpatient Visits           2 months ago Essential  hypertension   [place], MD   2 months ago Encounter for [employer] annual wellness exam   [place] Carilyn Goodpasture, RN   8 months ago Prediabetes   [place], Dionne Bucy, [place]   1 year ago Essential hypertension   [place] Ladell Pier, MD   1 year ago Encounter for examination following treatment at [place] Ladell Pier, MD

## 2022-06-18 ENCOUNTER — Other Ambulatory Visit: Payer: Self-pay | Admitting: Physician Assistant

## 2022-06-18 DIAGNOSIS — E039 Hypothyroidism, unspecified: Secondary | ICD-10-CM

## 2022-06-19 NOTE — Telephone Encounter (Signed)
Requested Prescriptions  Pending Prescriptions Disp Refills  . levothyroxine (SYNTHROID) 125 MCG tablet [Pharmacy Med Name: LEVOTHYROXINE 125 MCG TABLET] 90 tablet 2    Sig: TAKE 1 TABLET BY MOUTH DAILY BEFORE BREAKFAST.     Endocrinology:  Hypothyroid Agents Passed - 06/18/2022 12:11 AM      Passed - TSH in normal range and within 360 days    TSH  Date Value Ref Range Status  04/13/2022 0.747 0.450 - 4.500 uIU/mL Final         Passed - Valid encounter within last 12 months    Recent Outpatient Visits          2 months ago Essential hypertension   [place], MD   2 months ago Encounter for [employer] annual wellness exam   [place] Carilyn Goodpasture, RN   9 months ago Prediabetes   [place], Dionne Bucy, [place]   1 year ago Essential hypertension   [place] Ladell Pier, MD   1 year ago Encounter for examination following treatment at [place] Ladell Pier, MD

## 2022-06-24 DIAGNOSIS — J449 Chronic obstructive pulmonary disease, unspecified: Secondary | ICD-10-CM | POA: Diagnosis not present

## 2022-07-06 DIAGNOSIS — H906 Mixed conductive and sensorineural hearing loss, bilateral: Secondary | ICD-10-CM | POA: Diagnosis not present

## 2022-07-06 DIAGNOSIS — H9313 Tinnitus, bilateral: Secondary | ICD-10-CM | POA: Diagnosis not present

## 2022-07-13 ENCOUNTER — Other Ambulatory Visit: Payer: Self-pay | Admitting: Internal Medicine

## 2022-07-13 DIAGNOSIS — H1013 Acute atopic conjunctivitis, bilateral: Secondary | ICD-10-CM

## 2022-07-13 MED ORDER — FLUTICASONE PROPIONATE 50 MCG/ACT NA SUSP: NASAL | 1 refills | Status: DC

## 2022-07-13 NOTE — Telephone Encounter (Signed)
Requested Prescriptions  Pending Prescriptions Disp Refills  . fluticasone (FLONASE) 50 MCG/ACT nasal spray 48 mL 1    Sig: USE 2 SPRAYS INTO EACH NOSTRIL ONCE DAILY     Ear, Nose, and Throat: Nasal Preparations - Corticosteroids Passed - 07/13/2022  2:53 PM      Passed - Valid encounter within last 12 months    Recent Outpatient Visits          3 months ago Essential hypertension   [place], [place]   3 months ago Encounter for [employer] annual wellness exam   [place] Carilyn Goodpasture, RN   9 months ago Prediabetes   [place], Dionne Bucy, [place]   1 year ago Essential hypertension   [place] Ladell Pier, MD   1 year ago Encounter for examination following treatment at [place] Ladell Pier, MD

## 2022-07-13 NOTE — Telephone Encounter (Signed)
Medication Refill - Medication: fluticasone (FLONASE) 50 MCG/ACT nasal spray   Has the patient contacted their pharmacy? yes (Agent: If no, request that the patient contact the pharmacy for the refill. If patient does not wish to contact the pharmacy document the reason why and proceed with request.) (Agent: If yes, when and what did the pharmacy advise?)contact pcp  Preferred Pharmacy (with phone number or street name):  [id number] [place]  [phone] Fax:  [phone]    Has the patient been seen for an appointment in the last year OR does the patient have an upcoming appointment? yes  Agent: Please be advised that RX refills may take up to 3 business days. We ask that you follow-up with your pharmacy.

## 2022-07-13 NOTE — Telephone Encounter (Signed)
Pt called back to f/u on refill for Flonase / pt stated he really is in need of this today / please advise and [facility] to [id number] [place] [place] - [address]

## 2022-07-25 DIAGNOSIS — J449 Chronic obstructive pulmonary disease, unspecified: Secondary | ICD-10-CM | POA: Diagnosis not present

## 2022-07-31 ENCOUNTER — Encounter: Payer: Self-pay | Admitting: Cardiovascular Disease

## 2022-08-02 ENCOUNTER — Encounter: Payer: Self-pay | Admitting: Internal Medicine

## 2022-08-20 DIAGNOSIS — G4733 Obstructive sleep apnea (adult) (pediatric): Secondary | ICD-10-CM | POA: Diagnosis not present

## 2022-08-24 ENCOUNTER — Ambulatory Visit: Payer: Medicare HMO | Admitting: Podiatry

## 2022-08-25 DIAGNOSIS — J449 Chronic obstructive pulmonary disease, unspecified: Secondary | ICD-10-CM | POA: Diagnosis not present

## 2022-09-06 DIAGNOSIS — H6993 Unspecified Eustachian tube disorder, bilateral: Secondary | ICD-10-CM | POA: Diagnosis not present

## 2022-09-06 DIAGNOSIS — H6983 Other specified disorders of Eustachian tube, bilateral: Secondary | ICD-10-CM | POA: Diagnosis not present

## 2022-09-06 DIAGNOSIS — H906 Mixed conductive and sensorineural hearing loss, bilateral: Secondary | ICD-10-CM | POA: Diagnosis not present

## 2022-09-06 DIAGNOSIS — H6523 Chronic serous otitis media, bilateral: Secondary | ICD-10-CM | POA: Diagnosis not present

## 2022-09-06 DIAGNOSIS — J342 Deviated nasal septum: Secondary | ICD-10-CM | POA: Diagnosis not present

## 2022-09-11 ENCOUNTER — Other Ambulatory Visit: Payer: Self-pay | Admitting: Internal Medicine

## 2022-09-11 DIAGNOSIS — N401 Enlarged prostate with lower urinary tract symptoms: Secondary | ICD-10-CM

## 2022-09-21 ENCOUNTER — Ambulatory Visit: Payer: Medicare HMO | Admitting: Podiatry

## 2022-09-24 DIAGNOSIS — J449 Chronic obstructive pulmonary disease, unspecified: Secondary | ICD-10-CM | POA: Diagnosis not present

## 2022-09-28 ENCOUNTER — Other Ambulatory Visit: Payer: Self-pay | Admitting: Physician Assistant

## 2022-09-28 DIAGNOSIS — I25119 Atherosclerotic heart disease of native coronary artery with unspecified angina pectoris: Secondary | ICD-10-CM

## 2022-10-01 NOTE — Telephone Encounter (Signed)
Requested medication (s) are due for refill today:   Yes  Requested medication (s) are on the active medication list:   Yes  Future visit scheduled:   No   Last ordered: 09/20/2021 #90, 2 refills  Returned because lab work is due per protocol.     Requested Prescriptions  Pending Prescriptions Disp Refills   atorvastatin (LIPITOR) 40 MG tablet [Pharmacy Med Name: ATORVASTATIN 40 MG TABLET] 90 tablet 2    Sig: TAKE 1 TABLET BY MOUTH EVERY DAY     Cardiovascular:  Antilipid - Statins Failed - 09/28/2022  9:31 PM      Failed - Lipid Panel in normal range within the last 12 months    Cholesterol, Total  Date Value Ref Range Status  09/20/2021 131 100 - 199 mg/dL Final   LDL Chol Calc (NIH)  Date Value Ref Range Status  09/20/2021 60 0 - 99 mg/dL Final   HDL  Date Value Ref Range Status  09/20/2021 54 >39 mg/dL Final   Triglycerides  Date Value Ref Range Status  09/20/2021 92 0 - 149 mg/dL Final         Passed - Patient is not pregnant      Passed - Valid encounter within last 12 months    Recent Outpatient Visits           5 months ago Essential hypertension   [place], MD   6 months ago Encounter for [employer] annual wellness exam   [place] Carilyn Goodpasture, RN   1 year ago Prediabetes   [place], [place], [place]   1 year ago Essential hypertension   [place] Ladell Pier, MD   1 year ago Encounter for examination following treatment at [place] Ladell Pier, MD

## 2022-10-02 DIAGNOSIS — H6993 Unspecified Eustachian tube disorder, bilateral: Secondary | ICD-10-CM | POA: Diagnosis not present

## 2022-10-12 ENCOUNTER — Encounter: Payer: Self-pay | Admitting: Podiatrist

## 2022-10-12 ENCOUNTER — Ambulatory Visit (INDEPENDENT_AMBULATORY_CARE_PROVIDER_SITE_OTHER): Payer: Medicare HMO | Admitting: Podiatrist

## 2022-10-12 DIAGNOSIS — B351 Tinea unguium: Secondary | ICD-10-CM

## 2022-10-12 DIAGNOSIS — M79674 Pain in right toe(s): Secondary | ICD-10-CM | POA: Diagnosis not present

## 2022-10-12 DIAGNOSIS — M79675 Pain in left toe(s): Secondary | ICD-10-CM | POA: Diagnosis not present
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
## 2022-10-21 ENCOUNTER — Other Ambulatory Visit: Payer: Self-pay | Admitting: Internal Medicine

## 2022-10-21 DIAGNOSIS — I25119 Atherosclerotic heart disease of native coronary artery with unspecified angina pectoris: Secondary | ICD-10-CM

## 2022-10-23 NOTE — Telephone Encounter (Signed)
Requested medications are due for refill today.  yes  Requested medications are on the active medications list.  yes  Last refill. 09/28/2022 #30 0 rf  Future visit scheduled.   yes  Notes to clinic.  Pt already given a courtesy refill.    Requested Prescriptions  Pending Prescriptions Disp Refills   clopidogrel (PLAVIX) 75 MG tablet [Pharmacy Med Name: CLOPIDOGREL 75 MG TABLET] 90 tablet 1    Sig: TAKE 1 TABLET BY MOUTH ONCE DAILY WITH BREAKFAST     Hematology: Antiplatelets - clopidogrel Failed - 10/21/2022  2:32 PM      Failed - HCT in normal range and within 180 days    Hematocrit  Date Value Ref Range Status  04/13/2022 39.4 37.5 - 51.0 % Final         Failed - HGB in normal range and within 180 days    Hemoglobin  Date Value Ref Range Status  04/13/2022 13.4 13.0 - 17.7 g/dL Final         Failed - PLT in normal range and within 180 days    Platelets  Date Value Ref Range Status  04/13/2022 160 150 - 450 x10E3/uL Final         Failed - Valid encounter within last 6 months    Recent Outpatient Visits           6 months ago Essential hypertension   [place], Deborah B, MD   7 months ago Encounter for [employer] annual wellness exam   [place] Carilyn Goodpasture, RN   1 year ago Prediabetes   [place], [place], [place]   1 year ago Essential hypertension   [place] Ladell Pier, MD   1 year ago Encounter for examination following treatment at [place], MD       Future Appointments             In 1 month Ladell Pier, MD [place] in normal range and within 360 days    Creat  Date Value Ref Range Status  06/19/2016 1.56 (H) 0.70 - 1.25 mg/dL Final    Comment:      For patients > or  = 70 years of age: The upper reference limit for Creatinine is approximately 13% higher for people identified as African-American.      Creatinine, Ser  Date Value Ref Range Status  04/13/2022 1.09 0.76 - 1.27 mg/dL Final

## 2022-10-25 DIAGNOSIS — J449 Chronic obstructive pulmonary disease, unspecified: Secondary | ICD-10-CM | POA: Diagnosis not present

## 2022-10-28 DIAGNOSIS — W1830XA Fall on same level, unspecified, initial encounter: Secondary | ICD-10-CM | POA: Diagnosis not present

## 2022-10-28 DIAGNOSIS — S39012A Strain of muscle, fascia and tendon of lower back, initial encounter: Secondary | ICD-10-CM | POA: Diagnosis not present

## 2022-11-13 DIAGNOSIS — G4733 Obstructive sleep apnea (adult) (pediatric): Secondary | ICD-10-CM | POA: Diagnosis not present

## 2022-11-23 DIAGNOSIS — H906 Mixed conductive and sensorineural hearing loss, bilateral: Secondary | ICD-10-CM | POA: Diagnosis not present

## 2022-11-23 DIAGNOSIS — H6993 Unspecified Eustachian tube disorder, bilateral: Secondary | ICD-10-CM | POA: Diagnosis not present

## 2022-11-23 DIAGNOSIS — Z9622 Myringotomy tube(s) status: Secondary | ICD-10-CM | POA: Diagnosis not present

## 2022-11-24 DIAGNOSIS — J449 Chronic obstructive pulmonary disease, unspecified: Secondary | ICD-10-CM | POA: Diagnosis not present

## 2022-11-30 ENCOUNTER — Other Ambulatory Visit: Payer: Self-pay | Admitting: Internal Medicine

## 2022-11-30 ENCOUNTER — Ambulatory Visit: Payer: Medicare HMO | Admitting: Internal Medicine

## 2022-11-30 DIAGNOSIS — H1013 Acute atopic conjunctivitis, bilateral: Secondary | ICD-10-CM

## 2022-11-30 DIAGNOSIS — J439 Emphysema, unspecified: Secondary | ICD-10-CM

## 2022-11-30 DIAGNOSIS — I10 Essential (primary) hypertension: Secondary | ICD-10-CM

## 2022-11-30 DIAGNOSIS — N401 Enlarged prostate with lower urinary tract symptoms: Secondary | ICD-10-CM

## 2022-11-30 DIAGNOSIS — I25119 Atherosclerotic heart disease of native coronary artery with unspecified angina pectoris: Secondary | ICD-10-CM

## 2022-11-30 DIAGNOSIS — R7303 Prediabetes: Secondary | ICD-10-CM

## 2022-11-30 MED ORDER — FLUTICASONE PROPIONATE 50 MCG/ACT NA SUSP: NASAL | 1 refills | Status: DC

## 2022-11-30 MED ORDER — ATORVASTATIN CALCIUM 40 MG PO TABS: 40.0000 mg | ORAL_TABLET | Freq: Every day | ORAL | 0 refills | Status: DC

## 2022-11-30 MED ORDER — BISOPROLOL FUMARATE 5 MG PO TABS: 5.0000 mg | ORAL_TABLET | Freq: Every day | ORAL | 0 refills | Status: DC

## 2022-11-30 MED ORDER — TAMSULOSIN HCL 0.4 MG PO CAPS: 0.4000 mg | ORAL_CAPSULE | Freq: Every day | ORAL | 0 refills | Status: DC

## 2022-11-30 MED ORDER — METFORMIN HCL 500 MG PO TABS: ORAL_TABLET | ORAL | 0 refills | Status: DC

## 2022-11-30 MED ORDER — OLOPATADINE HCL 0.1 % OP SOLN: 1.0000 [drp] | Freq: Every day | OPHTHALMIC | 1 refills | Status: DC | PRN

## 2022-11-30 MED ORDER — TRELEGY ELLIPTA 100-62.5-25 MCG/ACT IN AEPB: 1.0000 | INHALATION_SPRAY | Freq: Every day | RESPIRATORY_TRACT | 2 refills | Status: DC

## 2022-11-30 NOTE — Telephone Encounter (Signed)
Requested Prescriptions  Pending Prescriptions Disp Refills   atorvastatin (LIPITOR) 40 MG tablet 90 tablet 0    Sig: Take 1 tablet (40 mg total) by mouth daily.     Cardiovascular:  Antilipid - Statins Failed - 11/30/2022 11:38 AM      Failed - Lipid Panel in normal range within the last 12 months    Cholesterol, Total  Date Value Ref Range Status  09/20/2021 131 100 - 199 mg/dL Final   LDL Chol Calc (NIH)  Date Value Ref Range Status  09/20/2021 60 0 - 99 mg/dL Final   HDL  Date Value Ref Range Status  09/20/2021 54 >39 mg/dL Final   Triglycerides  Date Value Ref Range Status  09/20/2021 92 0 - 149 mg/dL Final         Passed - Patient is not pregnant      Passed - Valid encounter within last 12 months    Recent Outpatient Visits           7 months ago Essential hypertension   [place], [place]   8 months ago Encounter for [employer] annual wellness exam   [place], Carilyn Goodpasture, RN   1 year ago Prediabetes   [place], [place], [place]   1 year ago Essential hypertension   [place], Deborah B, MD   2 years ago Encounter for examination following treatment at [place], MD       Future Appointments             In 2 months Ladell Pier, MD [place]             metFORMIN (GLUCOPHAGE) 500 MG tablet 45 tablet 0     Endocrinology:  Diabetes - Biguanides Failed - 11/30/2022 11:38 AM      Failed - HBA1C is between 0 and 7.9 and within 180 days    HbA1c, POC (prediabetic range)  Date Value Ref Range Status  08/30/2020 6.0 5.7 - 6.4 % Final   Hgb A1c MFr Bld  Date Value Ref Range Status  04/13/2022 5.8 (H) 4.8 - 5.6 % Final    Comment:             Prediabetes: 5.7 - 6.4          Diabetes: >6.4           Glycemic control for adults with diabetes: <7.0          Failed - B12 Level in normal range and within 720 days    No results found for: "VITAMINB12"       Failed - Valid encounter within last 6 months    Recent Outpatient Visits           7 months ago Essential hypertension   [place], MD   8 months ago Encounter for [employer] annual wellness exam   [place] Carilyn Goodpasture, RN   1 year ago Prediabetes   [place], [place], [place]   1 year ago Essential hypertension   [place] Ladell Pier, MD   2 years ago Encounter for examination following treatment at [place]  [facility] Ladell Pier, MD       Future Appointments             In 2 months Ladell Pier, MD [place]            Failed - CBC within normal limits and completed in the last 12 months    WBC  Date Value Ref Range Status  04/13/2022 9.1 3.4 - 10.8 x10E3/uL Final  09/21/2020 10.1 4.0 - 10.5 K/uL Final   RBC  Date Value Ref Range Status  04/13/2022 4.56 4.14 - 5.80 x10E6/uL Final  09/21/2020 4.68 4.22 - 5.81 MIL/uL Final   Hemoglobin  Date Value Ref Range Status  04/13/2022 13.4 13.0 - 17.7 g/dL Final   Hematocrit  Date Value Ref Range Status  04/13/2022 39.4 37.5 - 51.0 % Final   MCHC  Date Value Ref Range Status  04/13/2022 34.0 31.5 - 35.7 g/dL Final  09/21/2020 32.4 30.0 - 36.0 g/dL Final   [facility]  Date Value Ref Range Status  04/13/2022 29.4 26.6 - 33.0 pg Final  09/21/2020 28.2 26.0 - 34.0 pg Final   MCV  Date Value Ref Range Status  04/13/2022 86 79 - 97 fL Final   No results found for: "PLTCOUNTKUC", "LABPLAT", "POCPLA" RDW  Date Value Ref Range Status  04/13/2022 13.9 11.6 - 15.4 % Final         Passed - Cr in normal range and within 360 days     Creat  Date Value Ref Range Status  06/19/2016 1.56 (H) 0.70 - 1.25 mg/dL Final    Comment:      For patients > or = 71 years of age: The upper reference limit for Creatinine is approximately 13% higher for people identified as African-American.      Creatinine, Ser  Date Value Ref Range Status  04/13/2022 1.09 0.76 - 1.27 mg/dL Final         Passed - eGFR in normal range and within 360 days    GFR, Est African American  Date Value Ref Range Status  06/19/2016 54 (L) >=60 mL/min Final   GFR calc Af Amer  Date Value Ref Range Status  07/24/2020 >60 >60 mL/min Final   GFR, Est Non African American  Date Value Ref Range Status  06/19/2016 47 (L) >=60 mL/min Final   GFR, Estimated  Date Value Ref Range Status  09/21/2020 >60 >60 mL/min Final    Comment:    (NOTE) Calculated using the CKD-EPI Creatinine Equation (2021)    GFR  Date Value Ref Range Status  02/28/2016 86.86 >60.00 mL/min Final   eGFR  Date Value Ref Range Status  04/13/2022 73 >59 mL/min/1.73 Final          bisoprolol (ZEBETA) 5 MG tablet 90 tablet 0    Sig: Take 1 tablet (5 mg total) by mouth daily.     Cardiovascular: Beta Blockers 2 Failed - 11/30/2022 11:38 AM      Failed - Valid encounter within last 6 months    Recent Outpatient Visits           7 months ago Essential hypertension   [place], MD   8 months ago Encounter for [employer] annual wellness exam   [place] Carilyn Goodpasture, RN   1 year ago Prediabetes   [place], [place], [place]  1 year ago Essential hypertension   [place] Ladell Pier, MD   2 years ago Encounter for examination following treatment at [place], MD       Future Appointments             In 2 months Ladell Pier, MD [place] in normal range and within 360 days    Creat  Date Value Ref Range Status  06/19/2016 1.56 (H) 0.70 - 1.25 mg/dL Final    Comment:      For patients > or = 71 years of age: The upper reference limit for Creatinine is approximately 13% higher for people identified as African-American.      Creatinine, Ser  Date Value Ref Range Status  04/13/2022 1.09 0.76 - 1.27 mg/dL Final         Passed - Last BP in normal range    BP Readings from Last 1 Encounters:  04/13/22 125/80         Passed - Last Heart Rate in normal range    Pulse Readings from Last 1 Encounters:  04/13/22 62          olopatadine (PATANOL) 0.1 % ophthalmic solution 15 mL 1    Sig: Place 1 drop into both eyes daily as needed for allergies.     Ophthalmology:  Koleen Nimrod - 11/30/2022 11:38 AM      Passed - Valid encounter within last 12 months    Recent Outpatient Visits           7 months ago Essential hypertension   [place], MD   8 months ago Encounter for [employer] annual wellness exam   [place] Carilyn Goodpasture, RN   1 year ago Prediabetes   [place], Dionne Bu[place], [place]   1 year ago Essential hypertension   [place], Deborah B, MD   2 years ago Encounter for examination following treatment at [place], MD       Future Appointments             In 2 months Ladell Pier, MD [place]             fluticasone [facility]) 50 MCG/ACT nasal spray 48 mL 1    Sig: USE 2 SPRAYS INTO EACH NOSTRIL ONCE DAILY     Ear, Nose, and Throat: Nasal Preparations - Corticosteroids Passed - 11/30/2022 11:38 AM      Passed - Valid encounter within last 12 months    Recent Outpatient  Visits           7 months ago Essential hypertension   [place], MD   8 months ago Encounter for [employer] annual wellness exam   [place] Carilyn Goodpasture, RN   1 year ago Prediabetes   [place], Dionne Bu[place], [place]   1 year ago Essential hypertension   [place] Ladell Pier, MD   2 years ago Encounter for examination following treatment at hospital  [place] Ladell Pier, MD       Future Appointments             In 2 months Ladell Pier, MD [place]            Refused Prescriptions Disp Refills   tamsulosin [facility]) 0.4 MG CAPS capsule 90 capsule 0    Sig: Take 1 capsule (0.4 mg total) by mouth daily.     Urology: Alpha-Adrenergic Blocker Failed - 11/30/2022 11:38 AM      Failed - PSA in normal range and within 360 days    PSA  Date Value Ref Range Status  10/08/2013 0.53 <=4.00 ng/mL Final    Comment:    Test Methodology: ECLIA PSA (Electrochemiluminescence Immunoassay)   For PSA values from 2.5-4.0, particularly in younger men <23 years old, the AUA and NCCN suggest testing for % Free PSA (3515) and evaluation of the rate of increase in PSA (PSA velocity).   Prostate Specific Ag, Serum  Date Value Ref Range Status  02/14/2018 0.9 0.0 - 4.0 ng/mL Final    Comment:    Roche ECLIA methodology. According to the American Urological Association, Serum PSA should decrease and remain at undetectable levels after radical prostatectomy. The AUA defines biochemical recurrence as an initial PSA value 0.2 ng/mL or greater followed by a subsequent confirmatory PSA value 0.2 ng/mL or greater. Values obtained with different assay methods or kits cannot be used interchangeably. Results cannot be interpreted as absolute evidence of the presence  or absence of malignant disease.          Passed - Last BP in normal range    BP Readings from Last 1 Encounters:  04/13/22 125/80         Passed - Valid encounter within last 12 months    Recent Outpatient Visits           7 months ago Essential hypertension   [place], MD   8 months ago Encounter for [employer] annual wellness exam   [place] Carilyn Goodpasture, RN   1 year ago Prediabetes   [place], Dionne Bu[place], [place]   1 year ago Essential hypertension   [place], MD   2 years ago Encounter for examination following treatment at [place], MD       Future Appointments             In 2 months Wynetta Emery Dalbert [place], MD [place]

## 2022-11-30 NOTE — Telephone Encounter (Signed)
Requested medications are due for refill today.  yes  Requested medications are on the active medications list.  yes  Last refill. 09/20/2022 60 5 rf  Future visit scheduled.   yes  Notes to clinic.  Medication not assigned a protocol. Please review for refill.    Requested Prescriptions  Pending Prescriptions Disp Refills   Fluticasone-Umeclidin-Vilant (TRELEGY ELLIPTA) 100-62.5-25 MCG/ACT AEPB 60 each 5    Sig: Inhale 1 each into the lungs daily.     Off-Protocol Failed - 11/30/2022  9:16 AM      Failed - Medication not assigned to a protocol, review manually.      Passed - Valid encounter within last 12 months    Recent Outpatient Visits           7 months ago Essential hypertension   [place], MD   8 months ago Encounter for [employer] annual wellness exam   [place] Carilyn Goodpasture, RN   1 year ago Prediabetes   [place], Dionne Bu[place], [place]   1 year ago Essential hypertension   [place], MD   2 years ago Encounter for examination following treatment at [place], MD       Future Appointments             In 2 months Wynetta Emery Dalbert Batman, MD [place]

## 2022-11-30 NOTE — Telephone Encounter (Signed)
Medication Refill - Medication: Fluticasone-Umeclidin-Vilant (TRELEGY ELLIPTA) 100-62.5-25 MCG/ACT AEPB   Has the patient contacted their pharmacy? Yes.    Pt moved and changed pharmacy's and his new pharmacy stated that they would send a request to his old pharmacy for refill but has not done so yet.Per pt he asked for them to do this last year.   Preferred Pharmacy (with phone number or street name):  [id number] [place] [place]: [phone] Fax: [phone]    Has the patient been seen for an appointment in the last year OR does the patient have an upcoming appointment? Yes.    Agent: Please be advised that RX refills may take up to 3 business days. We ask that you follow-up with your pharmacy.

## 2022-11-30 NOTE — Telephone Encounter (Signed)
Requested medications are due for refill today.  yes  Requested medications are on the active medications list.  yes  Last refill. 09/11/2022,10/01/2022  Future visit scheduled.   yes  Notes to clinic.  Expired labs.     Requested Prescriptions  Pending Prescriptions Disp Refills   atorvastatin (LIPITOR) 40 MG tablet 90 tablet 0    Sig: Take 1 tablet (40 mg total) by mouth daily.     Cardiovascular:  Antilipid - Statins Failed - 11/30/2022 11:38 AM      Failed - Lipid Panel in normal range within the last 12 months    Cholesterol, Total  Date Value Ref Range Status  09/20/2021 131 100 - 199 mg/dL Final   LDL Chol Calc (NIH)  Date Value Ref Range Status  09/20/2021 60 0 - 99 mg/dL Final   HDL  Date Value Ref Range Status  09/20/2021 54 >39 mg/dL Final   Triglycerides  Date Value Ref Range Status  09/20/2021 92 0 - 149 mg/dL Final         Passed - Patient is not pregnant      Passed - Valid encounter within last 12 months    Recent Outpatient Visits           7 months ago Essential hypertension   [place], MD   8 months ago Encounter for [employer] annual wellness exam   [place] Carilyn Goodpasture, RN   1 year ago Prediabetes   [place], [place], [place]   1 year ago Essential hypertension   [place] Ladell Pier, MD   2 years ago Encounter for examination following treatment at [place], MD       Future Appointments             In 2 months Ladell Pier, MD [place]             tamsulosin (FLOMAX) 0.4 MG CAPS capsule 90 capsule 0    Sig: Take 1 capsule (0.4 mg total) by mouth daily.     Urology: Alpha-Adrenergic Blocker Failed - 11/30/2022 11:38 AM      Failed - PSA in normal range and within  360 days    PSA  Date Value Ref Range Status  10/08/2013 0.53 <=4.00 ng/mL Final    Comment:    Test Methodology: ECLIA PSA (Electrochemiluminescence Immunoassay)   For PSA values from 2.5-4.0, particularly in younger men <26 years old, the AUA and NCCN suggest testing for % Free PSA (3515) and evaluation of the rate of increase in PSA (PSA velocity).   Prostate Specific Ag, Serum  Date Value Ref Range Status  02/14/2018 0.9 0.0 - 4.0 ng/mL Final    Comment:    Roche ECLIA methodology. According to the American Urological Association, Serum PSA should decrease and remain at undetectable levels after radical prostatectomy. The AUA defines biochemical recurrence as an initial PSA value 0.2 ng/mL or greater followed by a subsequent confirmatory PSA value 0.2 ng/mL or greater. Values obtained with different assay methods or kits cannot be used interchangeably. Results cannot be interpreted as absolute evidence of the presence or absence of malignant disease.          Passed - Last BP in normal range    BP Readings from Last  1 Encounters:  04/13/22 125/80         Passed - Valid encounter within last 12 months    Recent Outpatient Visits           7 months ago Essential hypertension   [place], MD   8 months ago Encounter for [employer] annual wellness exam   [place] Carilyn Goodpasture, RN   1 year ago Prediabetes   [place], Dionne Bucy, [place]   1 year ago Essential hypertension   [place] Ladell Pier, MD   2 years ago Encounter for examination following treatment at [place], MD       Future Appointments             In 2 months Ladell Pier, MD [place]            Signed Prescriptions Disp Refills    metFORMIN (GLUCOPHAGE) 500 MG tablet 45 tablet 0    Sig: TAKE 1/2 TABLET EVERY DAY WITH BREAKFAST.     Endocrinology:  Diabetes - Biguanides Failed - 11/30/2022 11:38 AM      Failed - HBA1C is between 0 and 7.9 and within 180 days    HbA1c, POC (prediabetic range)  Date Value Ref Range Status  08/30/2020 6.0 5.7 - 6.4 % Final   Hgb A1c MFr Bld  Date Value Ref Range Status  04/13/2022 5.8 (H) 4.8 - 5.6 % Final    Comment:             Prediabetes: 5.7 - 6.4          Diabetes: >6.4          Glycemic control for adults with diabetes: <7.0          Failed - B12 Level in normal range and within 720 days    No results found for: "VITAMINB12"       Failed - Valid encounter within last 6 months    Recent Outpatient Visits           7 months ago Essential hypertension   [place], Deborah B, MD   8 months ago Encounter for [employer] annual wellness exam   [place] Carilyn Goodpasture, RN   1 year ago Prediabetes   [place], [place], [place]   1 year ago Essential hypertension   [place], Deborah B, MD   2 years ago Encounter for examination following treatment at [place], MD       Future Appointments             In 2 months Ladell Pier, MD [place]            Failed - CBC within normal limits and completed in the last 12 months    WBC  Date Value Ref Range Status  04/13/2022 9.1 3.4 - 10.8 x10E3/uL Final  09/21/2020 10.1 4.0 - 10.5 K/uL Final   RBC  Date Value Ref Range Status  04/13/2022 4.56 4.14 - 5.80 x10E6/uL Final  09/21/2020 4.68 4.22 - 5.81 MIL/uL Final   Hemoglobin  Date Value Ref  Range Status  04/13/2022 13.4 13.0 - 17.7 g/dL Final   Hematocrit  Date Value Ref Range Status  04/13/2022 39.4 37.5 - 51.0 %  Final   MCHC  Date Value Ref Range Status  04/13/2022 34.0 31.5 - 35.7 g/dL Final  09/21/2020 32.4 30.0 - 36.0 g/dL Final   [facility]  Date Value Ref Range Status  04/13/2022 29.4 26.6 - 33.0 pg Final  09/21/2020 28.2 26.0 - 34.0 pg Final   MCV  Date Value Ref Range Status  04/13/2022 86 79 - 97 fL Final   No results found for: "PLTCOUNTKUC", "LABPLAT", "POCPLA" RDW  Date Value Ref Range Status  04/13/2022 13.9 11.6 - 15.4 % Final         Passed - Cr in normal range and within 360 days    Creat  Date Value Ref Range Status  06/19/2016 1.56 (H) 0.70 - 1.25 mg/dL Final    Comment:      For patients > or = 70 years of age: The upper reference limit for Creatinine is approximately 13% higher for people identified as African-American.      Creatinine, Ser  Date Value Ref Range Status  04/13/2022 1.09 0.76 - 1.27 mg/dL Final         Passed - eGFR in normal range and within 360 days    GFR, Est African American  Date Value Ref Range Status  06/19/2016 54 (L) >=60 mL/min Final   GFR calc Af Amer  Date Value Ref Range Status  07/24/2020 >60 >60 mL/min Final   GFR, Est Non African American  Date Value Ref Range Status  06/19/2016 47 (L) >=60 mL/min Final   GFR, Estimated  Date Value Ref Range Status  09/21/2020 >60 >60 mL/min Final    Comment:    (NOTE) Calculated using the CKD-EPI Creatinine Equation (2021)    GFR  Date Value Ref Range Status  02/28/2016 86.86 >60.00 mL/min Final   eGFR  Date Value Ref Range Status  04/13/2022 73 >59 mL/min/1.73 Final          bisoprolol (ZEBETA) 5 MG tablet 90 tablet 0    Sig: Take 1 tablet (5 mg total) by mouth daily.     Cardiovascular: Beta Blockers 2 Failed - 11/30/2022 11:38 AM      Failed - Valid encounter within last 6 months    Recent Outpatient Visits           7 months ago Essential hypertension   [place], MD   8 months ago Encounter for [employer] annual  wellness exam   [place] Carilyn Goodpasture, RN   1 year ago Prediabetes   [place], Dionne Bu[place], [place]   1 year ago Essential hypertension   [place], Deborah B, MD   2 years ago Encounter for examination following treatment at [place], MD       Future Appointments             In 2 months Ladell Pier, MD [place] in normal range and within 360 days    Creat  Date Value Ref Range Status  06/19/2016 1.56 (H) 0.70 - 1.25 mg/dL Final    Comment:  For patients > or = 71 years of age: The upper reference limit for Creatinine is approximately 13% higher for people identified as African-American.      Creatinine, Ser  Date Value Ref Range Status  04/13/2022 1.09 0.76 - 1.27 mg/dL Final         Passed - Last BP in normal range    BP Readings from Last 1 Encounters:  04/13/22 125/80         Passed - Last Heart Rate in normal range    Pulse Readings from Last 1 Encounters:  04/13/22 62          olopatadine (PATANOL) 0.1 % ophthalmic solution 15 mL 1    Sig: Place 1 drop into both eyes daily as needed for allergies.     Ophthalmology:  Koleen Nimrod - 11/30/2022 11:38 AM      Passed - Valid encounter within last 12 months    Recent Outpatient Visits           7 months ago Essential hypertension   [place], [place]   8 months ago Encounter for [employer] annual wellness exam   [place] Carilyn Goodpasture, RN   1 year ago Prediabetes   [place], Dionne [place], [place]   1 year ago Essential hypertension   [place], Deborah B, MD   2 years ago Encounter for examination following  treatment at [place], MD       Future Appointments             In 2 months Ladell Pier, MD [place]             fluticasone [facility]) 50 MCG/ACT nasal spray 48 mL 1    Sig: USE 2 SPRAYS INTO EACH NOSTRIL ONCE DAILY     Ear, Nose, and Throat: Nasal Preparations - Corticosteroids Passed - 11/30/2022 11:38 AM      Passed - Valid encounter within last 12 months    Recent Outpatient Visits           7 months ago Essential hypertension   [place], MD   8 months ago Encounter for [employer] annual wellness exam   [place] Carilyn Goodpasture, RN   1 year ago Prediabetes   [place], Dionne Bu[place], [place]   1 year ago Essential hypertension   [place], MD   2 years ago Encounter for examination following treatment at [place], MD       Future Appointments             In 2 months Wynetta Emery Dalbert Batman, MD [place]

## 2022-11-30 NOTE — Telephone Encounter (Signed)
Patient states she had to cancel his appointment today due to transportation issues. Patient son had a work conflict therefore unable to bring patient to appointment. Patient [facility] appointment to PCP next available on 02/08/2023. Requesting a short supply to hold him over. Patient called pharmacy and was advised to call PCP office.   metFORMIN (GLUCOPHAGE) 500 MG tablet  atorvastatin (LIPITOR) 40 MG tablet  tamsulosin (FLOMAX) 0.4 MG CAPS capsule  bisoprolol (ZEBETA) 5 MG tablet  olopatadine (PATANOL) 0.1 % ophthalmic solution  fluticasone (FLONASE) 50 MCG/ACT nasal spray    [id number]: [phone] Fax: [phone]

## 2022-12-10 ENCOUNTER — Other Ambulatory Visit: Payer: Self-pay | Admitting: Internal Medicine

## 2022-12-10 ENCOUNTER — Other Ambulatory Visit: Payer: Self-pay | Admitting: Physician Assistant

## 2022-12-10 DIAGNOSIS — I10 Essential (primary) hypertension: Secondary | ICD-10-CM

## 2022-12-10 DIAGNOSIS — N401 Enlarged prostate with lower urinary tract symptoms: Secondary | ICD-10-CM

## 2022-12-10 NOTE — Telephone Encounter (Signed)
Refilled 11/30/2022. Requested Prescriptions  Pending Prescriptions Disp Refills   tamsulosin (FLOMAX) 0.4 MG CAPS capsule [Pharmacy Med Name: TAMSULOSIN HCL 0.4 MG CAPSULE] 90 capsule 0    Sig: TAKE 1 CAPSULE BY MOUTH EVERY DAY     Urology: Alpha-Adrenergic Blocker Failed - 12/10/2022 12:12 AM      Failed - PSA in normal range and within 360 days    PSA  Date Value Ref Range Status  10/08/2013 0.53 <=4.00 ng/mL Final    Comment:    Test Methodology: ECLIA PSA (Electrochemiluminescence Immunoassay)   For PSA values from 2.5-4.0, particularly in younger men <45 years old, the AUA and NCCN suggest testing for % Free PSA (3515) and evaluation of the rate of increase in PSA (PSA velocity).   Prostate Specific Ag, Serum  Date Value Ref Range Status  02/14/2018 0.9 0.0 - 4.0 ng/mL Final    Comment:    Roche ECLIA methodology. According to the American Urological Association, Serum PSA should decrease and remain at undetectable levels after radical prostatectomy. The AUA defines biochemical recurrence as an initial PSA value 0.2 ng/mL or greater followed by a subsequent confirmatory PSA value 0.2 ng/mL or greater. Values obtained with different assay methods or kits cannot be used interchangeably. Results cannot be interpreted as absolute evidence of the presence or absence of malignant disease.          Passed - Last BP in normal range    BP Readings from Last 1 Encounters:  04/13/22 125/80         Passed - Valid encounter within last 12 months    Recent Outpatient Visits           8 months ago Essential hypertension   [place], MD   8 months ago Encounter for [employer] annual wellness exam   [place] Carilyn Goodpasture, RN   1 year ago Prediabetes   [place], Dionne Bucy, [place]   1 year ago Essential hypertension   [place], MD   2 years ago Encounter for examination following treatment at [place], MD       Future Appointments             In 2 months Wynetta Emery Dalbert Batman, MD [place]

## 2022-12-10 NOTE — Telephone Encounter (Signed)
Unable to refill per protocol, Rx request was refilled 11/30/22 for 90 days. Request is too soon.E-Prescribing Status: Receipt confirmed by pharmacy (11/30/2022 12:11 PM EST).  Requested Prescriptions  Pending Prescriptions Disp Refills   bisoprolol (ZEBETA) 5 MG tablet [Pharmacy Med Name: BISOPROLOL FUMARATE 5 MG TAB] 90 tablet 3    Sig: TAKE 1 TABLET (5 MG TOTAL) BY MOUTH DAILY.     Cardiovascular: Beta Blockers 2 Failed - 12/10/2022 12:09 AM      Failed - Valid encounter within last 6 months    Recent Outpatient Visits           8 months ago Essential hypertension   [place], [place]   8 months ago Encounter for [employer] annual wellness exam   [place] Carilyn Goodpasture, RN   1 year ago Prediabetes   [place], Dionne Bucy, [place]   1 year ago Essential hypertension   [place] Ladell Pier, MD   2 years ago Encounter for examination following treatment at [place], MD       Future Appointments             In 2 months Ladell Pier, MD [place] in normal range and within 360 days    Creat  Date Value Ref Range Status  06/19/2016 1.56 (H) 0.70 - 1.25 mg/dL Final    Comment:      For patients > or = 71 years of age: The upper reference limit for Creatinine is approximately 13% higher for people identified as African-American.      Creatinine, Ser  Date Value Ref Range Status  04/13/2022 1.09 0.76 - 1.27 mg/dL Final         Passed - Last BP in normal range    BP Readings from Last 1 Encounters:  04/13/22 125/80         Passed - Last Heart Rate in normal range    Pulse Readings from Last 1 Encounters:  04/13/22 62

## 2022-12-18 ENCOUNTER — Other Ambulatory Visit: Payer: Self-pay | Admitting: Physician Assistant

## 2022-12-18 DIAGNOSIS — I25119 Atherosclerotic heart disease of native coronary artery with unspecified angina pectoris: Secondary | ICD-10-CM

## 2022-12-18 NOTE — Telephone Encounter (Signed)
Requested medications are due for refill today.  yes  Requested medications are on the active medications list.  yes  Last refill. 09/20/2021 #90 3 rf  Future visit scheduled.   yes  Notes to clinic.  Labs are expired.    Requested Prescriptions  Pending Prescriptions Disp Refills   ezetimibe (ZETIA) 10 MG tablet [Pharmacy Med Name: EZETIMIBE 10 MG TABLET] 90 tablet 3    Sig: TAKE 1 TABLET BY MOUTH EVERY DAY     Cardiovascular:  Antilipid - Sterol Transport Inhibitors Failed - 12/18/2022 12:12 AM      Failed - Lipid Panel in normal range within the last 12 months    Cholesterol, Total  Date Value Ref Range Status  09/20/2021 131 100 - 199 mg/dL Final   LDL Chol Calc (NIH)  Date Value Ref Range Status  09/20/2021 60 0 - 99 mg/dL Final   HDL  Date Value Ref Range Status  09/20/2021 54 >39 mg/dL Final   Triglycerides  Date Value Ref Range Status  09/20/2021 92 0 - 149 mg/dL Final         Passed - AST in normal range and within 360 days    AST  Date Value Ref Range Status  04/13/2022 17 0 - 40 IU/L Final         Passed - ALT in normal range and within 360 days    ALT  Date Value Ref Range Status  04/13/2022 27 0 - 44 IU/L Final         Passed - Patient is not pregnant      Passed - Valid encounter within last 12 months    Recent Outpatient Visits           8 months ago Essential hypertension   [place], MD   9 months ago Encounter for [employer] annual wellness exam   [place] Carilyn Goodpasture, RN   1 year ago Prediabetes   [place], Dionne Bucy, [place]   1 year ago Essential hypertension   [place] Ladell Pier, MD   2 years ago Encounter for examination following treatment at [place], MD       Future  Appointments             In 1 month Ladell Pier, MD [place]            Signed Prescriptions Disp Refills   pantoprazole (PROTONIX) 40 MG tablet 90 tablet 1    Sig: TAKE 1 TABLET BY MOUTH EVERY DAY     Gastroenterology: Proton Pump Inhibitors Passed - 12/18/2022 12:12 AM      Passed - Valid encounter within last 12 months    Recent Outpatient Visits           8 months ago Essential hypertension   [place] Ladell Pier, MD   9 months ago Encounter for [employer] annual wellness exam   [place], Carilyn Goodpasture, RN   1 year ago Prediabetes   [place], [place], [place]   1 year ago Essential hypertension   [place] Ladell Pier, MD   2  years ago Encounter for examination following treatment at [place], MD       Future Appointments             In 1 month Wynetta Emery, Dalbert Batman, MD [place]

## 2022-12-18 NOTE — Telephone Encounter (Signed)
Requested Prescriptions  Pending Prescriptions Disp Refills   pantoprazole (PROTONIX) 40 MG tablet [Pharmacy Med Name: PANTOPRAZOLE SOD DR 40 MG TAB] 90 tablet 1    Sig: TAKE 1 TABLET BY MOUTH EVERY DAY     Gastroenterology: Proton Pump Inhibitors Passed - 12/18/2022 12:12 AM      Passed - Valid encounter within last 12 months    Recent Outpatient Visits           8 months ago Essential hypertension   [place], MD   9 months ago Encounter for [employer] annual wellness exam   [place], Carilyn Goodpasture, [place]   1 year ago Prediabetes   [place], [place], [place]   1 year ago Essential hypertension   [place] Karle Plumber B, MD   2 years ago Encounter for examination following treatment at [place], MD       Future Appointments             In 1 month Ladell Pier, MD [place]             ezetimibe (ZETIA) 10 MG tablet [Pharmacy Med Name: EZETIMIBE 10 MG TABLET] 90 tablet 3    Sig: TAKE 1 TABLET BY MOUTH EVERY DAY     Cardiovascular:  Antilipid - Sterol Transport Inhibitors Failed - 12/18/2022 12:12 AM      Failed - Lipid Panel in normal range within the last 12 months    Cholesterol, Total  Date Value Ref Range Status  09/20/2021 131 100 - 199 mg/dL Final   LDL Chol Calc (NIH)  Date Value Ref Range Status  09/20/2021 60 0 - 99 mg/dL Final   HDL  Date Value Ref Range Status  09/20/2021 54 >39 mg/dL Final   Triglycerides  Date Value Ref Range Status  09/20/2021 92 0 - 149 mg/dL Final         Passed - AST in normal range and within 360 days    AST  Date Value Ref Range Status  04/13/2022 17 0 - 40 IU/L Final         Passed - ALT in normal range and within 360 days    ALT   Date Value Ref Range Status  04/13/2022 27 0 - 44 IU/L Final         Passed - Patient is not pregnant      Passed - Valid encounter within last 12 months    Recent Outpatient Visits           8 months ago Essential hypertension   [place], MD   9 months ago Encounter for [employer] annual wellness exam   [place], Carilyn Goodpasture, RN   1 year ago Prediabetes   [place], [place], [place]   1 year ago Essential hypertension   [place] Ladell Pier, MD   2 years ago Encounter for examination following treatment at [place], MD       Future Appointments  In 1 month Ladell Pier, MD [place]

## 2022-12-27 ENCOUNTER — Other Ambulatory Visit: Payer: Self-pay | Admitting: Internal Medicine

## 2022-12-27 DIAGNOSIS — I25119 Atherosclerotic heart disease of native coronary artery with unspecified angina pectoris: Secondary | ICD-10-CM

## 2022-12-27 NOTE — Telephone Encounter (Signed)
Last RF 11/30/22 #90 needs to keep appt in March  Requested Prescriptions  Refused Prescriptions Disp Refills   atorvastatin (LIPITOR) 40 MG tablet [Pharmacy Med Name: ATORVASTATIN 40 MG TABLET] 90 tablet 0    Sig: TAKE 1 TABLET BY MOUTH EVERY DAY     Cardiovascular:  Antilipid - Statins Failed - 12/27/2022 12:13 AM      Failed - Lipid Panel in normal range within the last 12 months    Cholesterol, Total  Date Value Ref Range Status  09/20/2021 131 100 - 199 mg/dL Final   LDL Chol Calc (NIH)  Date Value Ref Range Status  09/20/2021 60 0 - 99 mg/dL Final   HDL  Date Value Ref Range Status  09/20/2021 54 >39 mg/dL Final   Triglycerides  Date Value Ref Range Status  09/20/2021 92 0 - 149 mg/dL Final         Passed - Patient is not pregnant      Passed - Valid encounter within last 12 months    Recent Outpatient Visits           8 months ago Essential hypertension   [place], MD   9 months ago Encounter for [employer] annual wellness exam   [place], Carilyn Goodpasture, RN   1 year ago Prediabetes   [place], [place], [place]   1 year ago Essential hypertension   [place] Ladell Pier, MD   2 years ago Encounter for examination following treatment at [place], MD       Future Appointments             In 1 month Wynetta Emery, Dalbert Batman, MD [place]

## 2023-01-11 ENCOUNTER — Other Ambulatory Visit: Payer: Self-pay | Admitting: Internal Medicine

## 2023-01-11 ENCOUNTER — Encounter: Payer: Self-pay | Admitting: Podiatry

## 2023-01-11 ENCOUNTER — Ambulatory Visit: Payer: Medicare HMO | Admitting: Podiatry

## 2023-01-11 DIAGNOSIS — I2510 Atherosclerotic heart disease of native coronary artery without angina pectoris: Secondary | ICD-10-CM

## 2023-01-11 DIAGNOSIS — M79674 Pain in right toe(s): Secondary | ICD-10-CM

## 2023-01-11 DIAGNOSIS — M79675 Pain in left toe(s): Secondary | ICD-10-CM | POA: Diagnosis not present

## 2023-01-11 DIAGNOSIS — B351 Tinea unguium: Secondary | ICD-10-CM | POA: Diagnosis not present

## 2023-01-11 NOTE — Telephone Encounter (Signed)
Medication Refill - Medication: nitroGLYCERIN (NITROSTAT) 0.4 MG SL tablet   Has the patient contacted their pharmacy? Yes.   (Agent: If no, request that the patient contact the pharmacy for the refill. If patient does not wish to contact the pharmacy document the reason why and proceed with request.) (Agent: If yes, when and what did the pharmacy advise?)  Preferred Pharmacy (with phone number or street name):  [id number] [place] [address] Phone: [phone]: [phone]  Has the patient been seen for an appointment in the last year OR does the patient have an upcoming appointment? Yes.    Agent: Please be advised that RX refills may take up to 3 business days. We ask that you follow-up with your pharmacy.
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
## 2023-01-14 MED ORDER — NITROGLYCERIN 0.4 MG SL SUBL: 0.4000 mg | SUBLINGUAL_TABLET | SUBLINGUAL | 1 refills | Status: DC | PRN

## 2023-01-14 NOTE — Telephone Encounter (Signed)
Requested Prescriptions  Pending Prescriptions Disp Refills   nitroGLYCERIN (NITROSTAT) 0.4 MG SL tablet 30 tablet 1    Sig: Place 1 tablet (0.4 mg total) under the tongue every 5 (five) minutes as needed for chest pain.     Cardiovascular:  Nitrates Passed - 01/11/2023 12:56 PM      Passed - Last BP in normal range    BP Readings from Last 1 Encounters:  04/13/22 125/80         Passed - Last Heart Rate in normal range    Pulse Readings from Last 1 Encounters:  04/13/22 62         Passed - Valid encounter within last 12 months    Recent Outpatient Visits           9 months ago Essential hypertension   [place], MD   9 months ago Encounter for [employer] annual wellness exam   [place], Carilyn Goodpasture, [place]   1 year ago Prediabetes   [place], [place], [place]   2 years ago Essential hypertension   [place] Ladell Pier, MD   2 years ago Encounter for examination following treatment at [place], MD       Future Appointments             In 3 weeks Ladell Pier, MD [place]

## 2023-01-19 ENCOUNTER — Other Ambulatory Visit: Payer: Self-pay | Admitting: Internal Medicine

## 2023-01-19 DIAGNOSIS — I25119 Atherosclerotic heart disease of native coronary artery with unspecified angina pectoris: Secondary | ICD-10-CM

## 2023-01-21 NOTE — Telephone Encounter (Signed)
Requested medication (s) are due for refill today:   Yes  Requested medication (s) are on the active medication list:   Yes  Future visit scheduled:   Yes 02/08/2023 with Dr. Wynetta Emery   Last ordered: 10/23/2022 #90, 0 refills  Returned because labs are due plus there is a note he must keep upcoming appt. For refills.   Overdue a visit.   Requested Prescriptions  Pending Prescriptions Disp Refills   clopidogrel (PLAVIX) 75 MG tablet [Pharmacy Med Name: CLOPIDOGREL 75 MG TABLET] 90 tablet 0    Sig: TAKE 1 TABLET BY MOUTH ONCE DAILY WITH BREAKFAST     Hematology: Antiplatelets - clopidogrel Failed - 01/19/2023  9:14 AM      Failed - HCT in normal range and within 180 days    Hematocrit  Date Value Ref Range Status  04/13/2022 39.4 37.5 - 51.0 % Final         Failed - HGB in normal range and within 180 days    Hemoglobin  Date Value Ref Range Status  04/13/2022 13.4 13.0 - 17.7 g/dL Final         Failed - PLT in normal range and within 180 days    Platelets  Date Value Ref Range Status  04/13/2022 160 150 - 450 x10E3/uL Final         Failed - Valid encounter within last 6 months    Recent Outpatient Visits           9 months ago Essential hypertension   [place], [place]   10 months ago Encounter for [employer] annual wellness exam   [place] Carilyn Goodpasture, RN   1 year ago Prediabetes   [place], [place], [place]   2 years ago Essential hypertension   [place] Ladell Pier, MD   2 years ago Encounter for examination following treatment at [place], MD       Future Appointments             In 2 weeks Ladell Pier, MD [place] in normal range and within  360 days    Creat  Date Value Ref Range Status  06/19/2016 1.56 (H) 0.70 - 1.25 mg/dL Final    Comment:      For patients > or = 71 years of age: The upper reference limit for Creatinine is approximately 13% higher for people identified as African-American.      Creatinine, Ser  Date Value Ref Range Status  04/13/2022 1.09 0.76 - 1.27 mg/dL Final

## 2023-01-24 ENCOUNTER — Other Ambulatory Visit: Payer: Self-pay | Admitting: Internal Medicine

## 2023-01-24 DIAGNOSIS — R7303 Prediabetes: Secondary | ICD-10-CM

## 2023-02-08 ENCOUNTER — Encounter: Payer: Self-pay | Admitting: Internal Medicine

## 2023-02-08 ENCOUNTER — Ambulatory Visit: Payer: Medicare HMO | Attending: Internal Medicine | Admitting: Internal Medicine

## 2023-02-08 VITALS — BP 106/69 | HR 57 | Temp 98.4°F | Ht 63.0 in | Wt 186.0 lb

## 2023-02-08 DIAGNOSIS — I10 Essential (primary) hypertension: Secondary | ICD-10-CM | POA: Diagnosis not present

## 2023-02-08 DIAGNOSIS — Z23 Encounter for immunization: Secondary | ICD-10-CM | POA: Diagnosis not present

## 2023-02-08 DIAGNOSIS — Z1211 Encounter for screening for malignant neoplasm of colon: Secondary | ICD-10-CM

## 2023-02-08 DIAGNOSIS — I25119 Atherosclerotic heart disease of native coronary artery with unspecified angina pectoris: Secondary | ICD-10-CM

## 2023-02-08 DIAGNOSIS — R350 Frequency of micturition: Secondary | ICD-10-CM

## 2023-02-08 DIAGNOSIS — R7303 Prediabetes: Secondary | ICD-10-CM

## 2023-02-08 DIAGNOSIS — E039 Hypothyroidism, unspecified: Secondary | ICD-10-CM | POA: Diagnosis not present

## 2023-02-08 DIAGNOSIS — I2510 Atherosclerotic heart disease of native coronary artery without angina pectoris: Secondary | ICD-10-CM | POA: Diagnosis not present

## 2023-02-08 DIAGNOSIS — J9611 Chronic respiratory failure with hypoxia: Secondary | ICD-10-CM

## 2023-02-08 DIAGNOSIS — Z9981 Dependence on supplemental oxygen: Secondary | ICD-10-CM

## 2023-02-08 DIAGNOSIS — H1013 Acute atopic conjunctivitis, bilateral: Secondary | ICD-10-CM

## 2023-02-08 DIAGNOSIS — N401 Enlarged prostate with lower urinary tract symptoms: Secondary | ICD-10-CM

## 2023-02-08 DIAGNOSIS — G4733 Obstructive sleep apnea (adult) (pediatric): Secondary | ICD-10-CM

## 2023-02-08 LAB — POCT GLYCOSYLATED HEMOGLOBIN (HGB A1C): HbA1c, POC (controlled diabetic range): 5.8 % (ref 0.0–7.0)

## 2023-02-08 LAB — GLUCOSE, POCT (MANUAL RESULT ENTRY): POC Glucose: 100 mg/dl — AB (ref 70–99)

## 2023-02-08 MED ORDER — METFORMIN HCL 500 MG PO TABS: ORAL_TABLET | ORAL | 1 refills | Status: DC

## 2023-02-08 MED ORDER — OLOPATADINE HCL 0.1 % OP SOLN: 1.0000 [drp] | Freq: Every day | OPHTHALMIC | 1 refills | Status: DC | PRN

## 2023-02-08 MED ORDER — TAMSULOSIN HCL 0.4 MG PO CAPS: 0.4000 mg | ORAL_CAPSULE | Freq: Every day | ORAL | 1 refills | Status: DC

## 2023-02-08 MED ORDER — LEVOTHYROXINE SODIUM 125 MCG PO TABS: 125.0000 ug | ORAL_TABLET | Freq: Every day | ORAL | 2 refills | Status: DC

## 2023-02-08 MED ORDER — ATORVASTATIN CALCIUM 40 MG PO TABS: 40.0000 mg | ORAL_TABLET | Freq: Every day | ORAL | 1 refills | Status: DC

## 2023-02-08 MED ORDER — BISOPROLOL FUMARATE 5 MG PO TABS: 5.0000 mg | ORAL_TABLET | Freq: Every day | ORAL | 1 refills | Status: DC
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
## 2023-02-09 ENCOUNTER — Encounter: Payer: Self-pay | Admitting: Internal Medicine

## 2023-02-09 MED ORDER — ATORVASTATIN CALCIUM 40 MG PO TABS: 40.0000 mg | ORAL_TABLET | Freq: Every day | ORAL | 1 refills | Status: DC

## 2023-02-22 ENCOUNTER — Other Ambulatory Visit: Payer: Self-pay | Admitting: Internal Medicine

## 2023-02-22 MED ORDER — NAPHAZOLINE-GLYCERIN 0.012-0.2 % OP SOLN: 1.0000 [drp] | OPHTHALMIC | 1 refills | Status: AC

## 2023-02-22 NOTE — Telephone Encounter (Signed)
Medication Refill - Medication: naphazoline-glycerin (CLEAR EYES) 0.012-0.2 % SOLN  Has the patient contacted their pharmacy? Yes.   Pharmacy states that they have not received the prescription from pt PCP.    Preferred Pharmacy (with phone number or street name):  [id number] - [place], [place] Phone: [phone]  Fax: [phone]     Has the patient been seen for an appointment in the last year OR does the patient have an upcoming appointment? Yes.    Agent: Please be advised that RX refills may take up to 3 business days. We ask that you follow-up with your pharmacy.

## 2023-02-22 NOTE — Telephone Encounter (Signed)
Requested medications are due for refill today.  unsure  Requested medications are on the active medications list.  yes  Last refill. 02/15/2017  Future visit scheduled.   yes  Notes to clinic.  Medication is historical.    Requested Prescriptions  Pending Prescriptions Disp Refills   naphazoline-glycerin (CLEAR EYES REDNESS) 0.012-0.2 % SOLN      Sig: Place 1-2 drops into both eyes every morning.     Ophthalmology:  Koleen Nimrod - 02/22/2023 12:17 PM      Passed - Valid encounter within last 12 months    Recent Outpatient Visits           2 weeks ago [place], MD   10 months ago Essential hypertension   [place], MD   11 months ago Encounter for [employer] annual wellness exam   Rhea Carilyn Goodpasture, RN   1 year ago Prediabetes   [place], Dionne Bucy, [place]   2 years ago Essential hypertension   [place], MD       Future Appointments             In 3 months Wynetta Emery, Dalbert Batman, MD [place]

## 2023-02-25 ENCOUNTER — Other Ambulatory Visit: Payer: Self-pay | Admitting: Pharmacist

## 2023-02-25 MED ORDER — OLOPATADINE HCL 0.1 % OP SOLN: 1.0000 [drp] | Freq: Every day | OPHTHALMIC | 1 refills | Status: AC | PRN

## 2023-03-01 ENCOUNTER — Encounter: Payer: Self-pay | Admitting: Urology

## 2023-03-01 ENCOUNTER — Ambulatory Visit: Payer: Medicare HMO | Admitting: Urology

## 2023-03-01 ENCOUNTER — Telehealth: Payer: Self-pay | Admitting: Internal Medicine

## 2023-03-01 VITALS — BP 127/77 | HR 73 | Ht 63.0 in | Wt 186.0 lb

## 2023-03-01 DIAGNOSIS — N138 Other obstructive and reflux uropathy: Secondary | ICD-10-CM | POA: Diagnosis not present

## 2023-03-01 DIAGNOSIS — R350 Frequency of micturition: Secondary | ICD-10-CM | POA: Diagnosis not present

## 2023-03-01 DIAGNOSIS — N401 Enlarged prostate with lower urinary tract symptoms: Secondary | ICD-10-CM | POA: Diagnosis not present

## 2023-03-01 DIAGNOSIS — R351 Nocturia: Secondary | ICD-10-CM

## 2023-03-01 LAB — URINALYSIS, ROUTINE W REFLEX MICROSCOPIC
Bilirubin, UA: NEGATIVE
Glucose, UA: NEGATIVE
Ketones, UA: NEGATIVE
Leukocytes,UA: NEGATIVE
Nitrite, UA: NEGATIVE
Protein,UA: NEGATIVE
RBC, UA: NEGATIVE
Specific Gravity, UA: 1.01 (ref 1.005–1.030)
Urobilinogen, Ur: 0.2 mg/dL (ref 0.2–1.0)
pH, UA: 6.5 (ref 5.0–7.5)

## 2023-03-01 LAB — BLADDER SCAN AMB NON-IMAGING

## 2023-03-01 MED ORDER — MIRABEGRON ER 25 MG PO TB24: 25.0000 mg | ORAL_TABLET | Freq: Every day | ORAL | 0 refills | Status: AC

## 2023-03-01 NOTE — Telephone Encounter (Signed)
Copied from CRM [id number]. Topic: General - Other >> Mar 01, 2023 12:20 PM George Robbins wrote: Reason for CRM: Pt requests a Rx for a walker with a bench.Cb# [phone]
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
## 2023-03-02 LAB — PSA: Prostate Specific Ag, Serum: 0.8 ng/mL (ref 0.0–4.0)

## 2023-03-04 ENCOUNTER — Telehealth: Payer: Self-pay

## 2023-03-04 NOTE — Telephone Encounter (Signed)
Notified pt as advised, pt expressed understanding.  ?

## 2023-03-04 NOTE — Telephone Encounter (Signed)
-----   Message from Milderd Meager, MD sent at 03/02/2023 12:04 PM EDT ----- Please notify patient of normal PSA

## 2023-03-04 NOTE — Telephone Encounter (Signed)
Called & spoke to the patient. Verified name & DOB. Scheduled appointment for 04/16/2023. Patient agreed to the time & date. No further questions at this time.

## 2023-03-11 ENCOUNTER — Other Ambulatory Visit: Payer: Self-pay | Admitting: Internal Medicine

## 2023-03-11 DIAGNOSIS — I2510 Atherosclerotic heart disease of native coronary artery without angina pectoris: Secondary | ICD-10-CM

## 2023-03-12 NOTE — Telephone Encounter (Signed)
Requested Prescriptions  Pending Prescriptions Disp Refills   nitroGLYCERIN (NITROSTAT) 0.4 MG SL tablet [Pharmacy Med Name: NITROGLYCERIN 0.4 MG TABLET SL] 25 tablet 2    Sig: PLACE 1 TABLET UNDER THE TONGUE EVERY 5 MINUTES AS NEEDED FOR CHEST PAIN.     Cardiovascular:  Nitrates Passed - 03/11/2023  9:30 AM      Passed - Last BP in normal range    BP Readings from Last 1 Encounters:  03/01/23 127/77         Passed - Last Heart Rate in normal range    Pulse Readings from Last 1 Encounters:  03/01/23 73         Passed - Valid encounter within last 12 months    Recent Outpatient Visits           1 month ago Prediabetes   [facility] [name], MD   11 months ago Essential hypertension   [facility] [name], MD   11 months ago Encounter for [employer] annual wellness exam   [facility] [place], Eustace Pen, RN   1 year ago Prediabetes   [facility] [place], [name], [place]   2 years ago Essential hypertension   [facility] [name], MD       Future Appointments             In 1 month Laural Benes, Binnie Rail, MD [facility]   In 3 months Laural Benes, Binnie Rail, MD [facility]

## 2023-03-22 ENCOUNTER — Encounter: Payer: Medicare HMO | Admitting: Pharmacist

## 2023-03-29 ENCOUNTER — Other Ambulatory Visit: Payer: Self-pay | Admitting: Internal Medicine

## 2023-03-29 DIAGNOSIS — J439 Emphysema, unspecified: Secondary | ICD-10-CM

## 2023-03-29 DIAGNOSIS — E039 Hypothyroidism, unspecified: Secondary | ICD-10-CM

## 2023-04-05 ENCOUNTER — Ambulatory Visit: Payer: Medicare HMO | Attending: Internal Medicine | Admitting: Pharmacist

## 2023-04-05 ENCOUNTER — Encounter: Payer: Self-pay | Admitting: Pharmacist

## 2023-04-05 ENCOUNTER — Ambulatory Visit: Payer: Medicare HMO

## 2023-04-05 VITALS — BP 127/73 | HR 60 | Temp 98.3°F | Ht 64.0 in | Wt 191.6 lb

## 2023-04-05 DIAGNOSIS — Z0000 Encounter for general adult medical examination without abnormal findings: Secondary | ICD-10-CM | POA: Diagnosis not present

## 2023-04-05 DIAGNOSIS — I10 Essential (primary) hypertension: Secondary | ICD-10-CM

## 2023-04-05 DIAGNOSIS — I2510 Atherosclerotic heart disease of native coronary artery without angina pectoris: Secondary | ICD-10-CM

## 2023-04-05 DIAGNOSIS — Z125 Encounter for screening for malignant neoplasm of prostate: Secondary | ICD-10-CM

## 2023-04-05 DIAGNOSIS — R7303 Prediabetes: Secondary | ICD-10-CM

## 2023-04-05 DIAGNOSIS — E039 Hypothyroidism, unspecified: Secondary | ICD-10-CM
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
## 2023-04-06 ENCOUNTER — Other Ambulatory Visit: Payer: Self-pay | Admitting: Internal Medicine

## 2023-04-06 DIAGNOSIS — E039 Hypothyroidism, unspecified: Secondary | ICD-10-CM

## 2023-04-06 LAB — COMPREHENSIVE METABOLIC PANEL
ALT: 22 IU/L (ref 0–44)
AST: 18 IU/L (ref 0–40)
Albumin/Globulin Ratio: 1.5 (ref 1.2–2.2)
Albumin: 4.3 g/dL (ref 3.9–4.9)
Alkaline Phosphatase: 96 IU/L (ref 44–121)
BUN/Creatinine Ratio: 13 (ref 10–24)
BUN: 14 mg/dL (ref 8–27)
Bilirubin Total: 0.4 mg/dL (ref 0.0–1.2)
CO2: 24 mmol/L (ref 20–29)
Calcium: 10.2 mg/dL (ref 8.6–10.2)
Chloride: 100 mmol/L (ref 96–106)
Creatinine, Ser: 1.09 mg/dL (ref 0.76–1.27)
Globulin, Total: 2.9 g/dL (ref 1.5–4.5)
Glucose: 96 mg/dL (ref 70–99)
Potassium: 4.2 mmol/L (ref 3.5–5.2)
Sodium: 141 mmol/L (ref 134–144)
Total Protein: 7.2 g/dL (ref 6.0–8.5)
eGFR: 73 mL/min/{1.73_m2} (ref >59)

## 2023-04-06 LAB — CBC
Hematocrit: 40 % (ref 37.5–51.0)
Hemoglobin: 12.8 g/dL — ABNORMAL LOW (ref 13.0–17.7)
MCH: 29 pg (ref 26.6–33.0)
MCHC: 32 g/dL (ref 31.5–35.7)
MCV: 91 fL (ref 79–97)
Platelets: 151 10*3/uL (ref 150–450)
RBC: 4.41 x10E6/uL (ref 4.14–5.80)
RDW: 14.2 % (ref 11.6–15.4)
WBC: 8.3 10*3/uL (ref 3.4–10.8)

## 2023-04-06 LAB — LIPID PANEL
Chol/HDL Ratio: 2.3 ratio (ref 0.0–5.0)
Cholesterol, Total: 141 mg/dL (ref 100–199)
HDL: 61 mg/dL (ref >39)
LDL Chol Calc (NIH): 65 mg/dL (ref 0–99)
Triglycerides: 76 mg/dL (ref 0–149)
VLDL Cholesterol Cal: 15 mg/dL (ref 5–40)

## 2023-04-06 LAB — HEMOGLOBIN A1C
Est. average glucose Bld gHb Est-mCnc: 120 mg/dL
Hgb A1c MFr Bld: 5.8 % — ABNORMAL HIGH (ref 4.8–5.6)

## 2023-04-06 LAB — TSH: TSH: 0.422 u[IU]/mL — ABNORMAL LOW (ref 0.450–4.500)

## 2023-04-06 LAB — PSA: Prostate Specific Ag, Serum: 0.7 ng/mL (ref 0.0–4.0)

## 2023-04-06 MED ORDER — LEVOTHYROXINE SODIUM 112 MCG PO TABS
112.0000 ug | ORAL_TABLET | Freq: Every day | ORAL | 1 refills | Status: DC
Start: 2023-04-06 — End: 2023-09-20

## 2023-04-07 ENCOUNTER — Telehealth: Payer: Self-pay | Admitting: Internal Medicine

## 2023-04-07 DIAGNOSIS — J439 Emphysema, unspecified: Secondary | ICD-10-CM

## 2023-04-07 DIAGNOSIS — Z9981 Dependence on supplemental oxygen: Secondary | ICD-10-CM

## 2023-04-07 NOTE — Telephone Encounter (Signed)
-----   Message from Drucilla Chalet, RPH-CPP sent at 04/05/2023 11:58 AM EDT ----- Dr. Laural Benes,   I saw this patient his AWV today. I recently found that I can place referrals for certain care items during these visits. I was able to place referrals for ophthalmology and GI (colonoscopy) today.   However, I am still unable to place orders for equipment. I documented clinical rationale in my note, but George Robbins requests a rollator walker, shower chair, and bathroom grab bars. He has a history of chronic back pain and chronic respiratory failure. He endorses difficult transfer from seated to standing and has trouble standing for prolonged periods of time.   Thanks for including me,   George Robbins

## 2023-04-08 ENCOUNTER — Telehealth: Payer: Self-pay

## 2023-04-08 NOTE — Telephone Encounter (Signed)
Copied from CRM [id number]. Topic: Referral - Status >> Apr 08, 2023 12:46 PM Everette C wrote: Reason for CRM: The patient's referral to [facility] has been denied due to insurance been unaccepted   Please contact further if needed

## 2023-04-08 NOTE — Telephone Encounter (Signed)
Prescription successfully faxed to [facility]. In-person appointment scheduled for 04/16/2023 for rollator walker.

## 2023-04-09 NOTE — Telephone Encounter (Signed)
Called & spoke to the patient. Verified name & DOB. Informed that a new referral has been sent to [facility]. Patient expressed verbal understanding. No further questions at this time.

## 2023-04-12 ENCOUNTER — Ambulatory Visit: Payer: Medicare HMO | Admitting: Podiatry

## 2023-04-16 ENCOUNTER — Ambulatory Visit: Payer: Medicare HMO | Attending: Internal Medicine | Admitting: Internal Medicine

## 2023-04-16 ENCOUNTER — Encounter: Payer: Self-pay | Admitting: Internal Medicine

## 2023-04-16 VITALS — BP 109/66 | HR 65 | Temp 98.5°F | Ht 64.0 in | Wt 192.0 lb

## 2023-04-16 DIAGNOSIS — M5440 Lumbago with sciatica, unspecified side: Secondary | ICD-10-CM

## 2023-04-16 DIAGNOSIS — Z9981 Dependence on supplemental oxygen: Secondary | ICD-10-CM

## 2023-04-16 DIAGNOSIS — M5441 Lumbago with sciatica, right side: Secondary | ICD-10-CM

## 2023-04-16 DIAGNOSIS — R269 Unspecified abnormalities of gait and mobility: Secondary | ICD-10-CM | POA: Diagnosis not present

## 2023-04-16 DIAGNOSIS — M25661 Stiffness of right knee, not elsewhere classified: Secondary | ICD-10-CM

## 2023-04-16 DIAGNOSIS — J9611 Chronic respiratory failure with hypoxia: Secondary | ICD-10-CM | POA: Diagnosis not present

## 2023-04-16 DIAGNOSIS — M25662 Stiffness of left knee, not elsewhere classified: Secondary | ICD-10-CM

## 2023-04-16 DIAGNOSIS — G8929 Other chronic pain: Secondary | ICD-10-CM
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
## 2023-05-03 ENCOUNTER — Encounter: Payer: Self-pay | Admitting: Podiatry

## 2023-05-03 ENCOUNTER — Ambulatory Visit: Payer: Medicare HMO | Admitting: Podiatry

## 2023-05-03 DIAGNOSIS — M79674 Pain in right toe(s): Secondary | ICD-10-CM

## 2023-05-03 DIAGNOSIS — M79675 Pain in left toe(s): Secondary | ICD-10-CM | POA: Diagnosis not present

## 2023-05-03 DIAGNOSIS — B351 Tinea unguium: Secondary | ICD-10-CM
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
## 2023-05-24 ENCOUNTER — Other Ambulatory Visit: Payer: Medicare HMO

## 2023-05-31 ENCOUNTER — Ambulatory Visit: Payer: Medicare HMO | Attending: Internal Medicine

## 2023-05-31 DIAGNOSIS — E039 Hypothyroidism, unspecified: Secondary | ICD-10-CM

## 2023-06-01 LAB — TSH: TSH: 0.707 u[IU]/mL (ref 0.450–4.500)

## 2023-06-14 ENCOUNTER — Ambulatory Visit: Payer: Medicare HMO | Admitting: Internal Medicine

## 2023-07-04 ENCOUNTER — Other Ambulatory Visit: Payer: Self-pay | Admitting: Internal Medicine

## 2023-07-04 DIAGNOSIS — I10 Essential (primary) hypertension: Secondary | ICD-10-CM

## 2023-07-04 DIAGNOSIS — N401 Enlarged prostate with lower urinary tract symptoms: Secondary | ICD-10-CM

## 2023-07-06 ENCOUNTER — Other Ambulatory Visit: Payer: Self-pay | Admitting: Internal Medicine

## 2023-07-06 DIAGNOSIS — R7303 Prediabetes: Secondary | ICD-10-CM

## 2023-07-12 ENCOUNTER — Ambulatory Visit: Payer: Medicare HMO | Admitting: Internal Medicine

## 2023-07-14 ENCOUNTER — Other Ambulatory Visit: Payer: Self-pay | Admitting: Internal Medicine

## 2023-07-14 DIAGNOSIS — I25119 Atherosclerotic heart disease of native coronary artery with unspecified angina pectoris: Secondary | ICD-10-CM

## 2023-07-16 NOTE — Telephone Encounter (Signed)
Requested Prescriptions  Pending Prescriptions Disp Refills   clopidogrel (PLAVIX) 75 MG tablet [Pharmacy Med Name: CLOPIDOGREL 75 MG TABLET] 90 tablet 0    Sig: TAKE 1 TABLET BY MOUTH ONCE DAILY WITH BREAKFAST     Hematology: Antiplatelets - clopidogrel Failed - 07/14/2023  2:43 PM      Failed - HGB in normal range and within 180 days    Hemoglobin  Date Value Ref Range Status  04/05/2023 12.8 (L) 13.0 - 17.7 g/dL Final         Passed - HCT in normal range and within 180 days    Hematocrit  Date Value Ref Range Status  04/05/2023 40.0 37.5 - 51.0 % Final         Passed - PLT in normal range and within 180 days    Platelets  Date Value Ref Range Status  04/05/2023 151 150 - 450 x10E3/uL Final         Passed - Cr in normal range and within 360 days    Creat  Date Value Ref Range Status  06/19/2016 1.56 (H) 0.70 - 1.25 mg/dL Final    Comment:      For patients > or = 71 years of age: The upper reference limit for Creatinine is approximately 13% higher for people identified as African-American.      Creatinine, Ser  Date Value Ref Range Status  04/05/2023 1.09 0.76 - 1.27 mg/dL Final         Passed - Valid encounter within last 6 months    Recent Outpatient Visits           3 months ago Chronic bilateral low back pain with sciatica, sciatica laterality unspecified   [facility] [name], MD   5 months ago Prediabetes   [facility] [name], MD   1 year ago Essential hypertension   [facility] [name], MD   1 year ago Encounter for [employer] annual wellness exam   [facility] [name], RN   1 year ago Prediabetes   [facility] [place], [name], [place]       Future Appointments             In 2 months Laural Benes, Binnie Rail, MD [facility]  & [facility]

## 2023-08-02 ENCOUNTER — Ambulatory Visit: Payer: Medicare HMO | Admitting: Podiatry

## 2023-08-08 ENCOUNTER — Other Ambulatory Visit: Payer: Self-pay | Admitting: Internal Medicine

## 2023-08-08 DIAGNOSIS — H1013 Acute atopic conjunctivitis, bilateral: Secondary | ICD-10-CM

## 2023-08-09 ENCOUNTER — Other Ambulatory Visit: Payer: Self-pay | Admitting: Internal Medicine

## 2023-08-09 ENCOUNTER — Ambulatory Visit: Payer: Medicare HMO | Admitting: Podiatry

## 2023-08-09 DIAGNOSIS — M79674 Pain in right toe(s): Secondary | ICD-10-CM | POA: Diagnosis not present

## 2023-08-09 DIAGNOSIS — M79675 Pain in left toe(s): Secondary | ICD-10-CM | POA: Diagnosis not present

## 2023-08-09 DIAGNOSIS — B351 Tinea unguium: Secondary | ICD-10-CM | POA: Diagnosis not present

## 2023-08-09 DIAGNOSIS — J439 Emphysema, unspecified: Secondary | ICD-10-CM
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
## 2023-08-09 NOTE — Telephone Encounter (Signed)
Medication Refill - Medication: VENTOLIN HFA 108 (90 Base) MCG/ACT inhale  Has the patient contacted their pharmacy? Yes.    (  Preferred Pharmacy (with phone number or street name):  [id number] [place], [place] - [address]  [address] [place] [address]  Phone: [phone] Fax: [phone]  Hours: Not open 24 hours        Has the patient been seen for an appointment in the last year OR does the patient have an upcoming appointment? Yes.    Agent: Please be advised that RX refills may take up to 3 business days. We ask that you follow-up with your pharmacy.

## 2023-08-12 MED ORDER — VENTOLIN HFA 108 (90 BASE) MCG/ACT IN AERS
INHALATION_SPRAY | RESPIRATORY_TRACT | 2 refills | Status: DC
Start: 2023-08-12 — End: 2023-11-18

## 2023-08-12 NOTE — Telephone Encounter (Signed)
Requested Prescriptions  Pending Prescriptions Disp Refills   VENTOLIN HFA 108 (90 Base) MCG/ACT inhaler 18 each 2    Sig: TAKE 2 PUFFS BY MOUTH EVERY 6 HOURS AS NEEDED FOR WHEEZE OR SHORTNESS OF BREATH     Pulmonology:  Beta Agonists 2 Passed - 08/09/2023 10:56 AM      Passed - Last BP in normal range    BP Readings from Last 1 Encounters:  04/16/23 109/66         Passed - Last Heart Rate in normal range    Pulse Readings from Last 1 Encounters:  04/16/23 65         Passed - Valid encounter within last 12 months    Recent Outpatient Visits           3 months ago Chronic bilateral low back pain with sciatica, sciatica laterality unspecified   [facility] [name], MD   6 months ago Prediabetes   [facility] [name], MD   1 year ago Essential hypertension   [facility] [name], MD   1 year ago Encounter for [employer] annual wellness exam   [facility] [name], RN   1 year ago Prediabetes   [facility] [place], [name], [place]       Future Appointments             In 1 month Laural Benes, Binnie Rail, MD [facility]

## 2023-09-18 ENCOUNTER — Other Ambulatory Visit: Payer: Self-pay | Admitting: Internal Medicine

## 2023-09-18 DIAGNOSIS — H1013 Acute atopic conjunctivitis, bilateral: Secondary | ICD-10-CM

## 2023-09-18 DIAGNOSIS — E039 Hypothyroidism, unspecified: Secondary | ICD-10-CM

## 2023-09-18 DIAGNOSIS — I25119 Atherosclerotic heart disease of native coronary artery with unspecified angina pectoris: Secondary | ICD-10-CM

## 2023-09-18 DIAGNOSIS — N401 Enlarged prostate with lower urinary tract symptoms: Secondary | ICD-10-CM

## 2023-09-18 DIAGNOSIS — I10 Essential (primary) hypertension: Secondary | ICD-10-CM

## 2023-09-20 NOTE — Telephone Encounter (Signed)
Requested Prescriptions  Pending Prescriptions Disp Refills   fluticasone (FLONASE) 50 MCG/ACT nasal spray [Pharmacy Med Name: FLUTICASONE PROP 50 MCG SPRAY] 48 mL 0    Sig: USE 2 SPRAYS INTO EACH NOSTRIL ONCE DAILY     Ear, Nose, and Throat: Nasal Preparations - Corticosteroids Passed - 09/18/2023  5:24 PM      Passed - Valid encounter within last 12 months    Recent Outpatient Visits           5 months ago Chronic bilateral low back pain with sciatica, sciatica laterality unspecified   George Robbins George Matar, MD   7 months ago Prediabetes   [facility] George Matar, MD   1 year ago Essential hypertension   [facility] Rob[facility] George Matar, MD   1 year ago Encounter for [employer] annual wellness exam   [facility] [place] [facility] George Franco, RN   2 years ago Prediabetes   [facility] Robbins, George Robbins, [place]       Future Appointments             In 1 week George Matar, MD George Robbins             bisoprolol (ZEBETA) 5 MG tablet [Pharmacy Med Name: BISOPROLOL FUMARATE 5 MG TAB] 90 tablet 0    Sig: TAKE 1 TABLET (5 MG TOTAL) BY MOUTH DAILY.     Cardiovascular: Beta Blockers 2 Passed - 09/18/2023  5:24 PM      Passed - Cr in normal range and within 360 days    Creat  Date Value Ref Range Status  06/19/2016 1.56 (H) 0.70 - 1.25 mg/dL Final    Comment:      For patients > or = 71 years of age: The upper reference limit for Creatinine is approximately 13% higher for people identified as African-George.      Creatinine, Ser  Date Value Ref Range Status  04/05/2023 1.09 0.76 - 1.27 mg/dL Final         Passed - Last BP in normal range    BP Readings from Last 1 Encounters:  04/16/23 109/66         Passed - Last Heart Rate in normal range    Pulse  Readings from Last 1 Encounters:  04/16/23 65         Passed - Valid encounter within last 6 months    Recent Outpatient Visits           5 months ago Chronic bilateral low back pain with sciatica, sciatica laterality unspecified   [facility] Robbins George Matar, MD   7 months ago Prediabetes   George Robbins George Matar, MD   1 year ago Essential hypertension   George Robbins George Matar, MD   1 year ago Encounter for [employer] annual wellness exam   [facility] [place] [facility] George Franco, RN   2 years ago Prediabetes   [place] [facility] [place] George Cruz, George Robbins, [place]       Future Appointments             In 1 week George Matar, MD [facility] [employer]  tamsulosin (FLOMAX) 0.4 MG CAPS capsule [Pharmacy Med Name: TAMSULOSIN HCL 0.4 MG CAPSULE] 90 capsule 0    Sig: TAKE 1 CAPSULE BY MOUTH EVERY DAY     Urology: Alpha-Adrenergic Blocker Passed - 09/18/2023  5:24 PM      Passed - PSA in normal range and within 360 days    PSA  Date Value Ref Range Status  10/08/2013 0.53 <=4.00 ng/mL Final    Comment:    Test Methodology: ECLIA PSA (Electrochemiluminescence Immunoassay)   For PSA values from 2.5-4.0, particularly in younger men <59 years old, the AUA and NCCN suggest testing for % Free PSA (3515) and evaluation of the rate of increase in PSA (PSA velocity).   Prostate Specific Ag, Serum  Date Value Ref Range Status  04/05/2023 0.7 0.0 - 4.0 ng/mL Final    Comment:    Roche ECLIA methodology. According to the [facility] [employer], Serum PSA should decrease and remain at undetectable levels after radical prostatectomy. The AUA defines biochemical recurrence as an initial PSA value 0.2 ng/mL or greater followed by a subsequent confirmatory PSA value 0.2  ng/mL or greater. Values obtained with different assay methods or kits cannot be used interchangeably. Results cannot be interpreted as absolute evidence of the presence or absence of malignant disease.          Passed - Last BP in normal range    BP Readings from Last 1 Encounters:  04/16/23 109/66         Passed - Valid encounter within last 12 months    Recent Outpatient Visits           5 months ago Chronic bilateral low back pain with sciatica, sciatica laterality unspecified   [place] [facility] Matar, MD   7 months ago Prediabetes   [place] [facility] George Matar, MD   1 year ago Essential hypertension   [place] [facility] George Matar, MD   1 year ago Encounter for [employer] annual wellness exam   [place] [facility] George Franco, RN   2 years ago Prediabetes   George Robbins, George Robbins, [place]       Future Appointments             In 1 week George Matar, MD George Robbins             pantoprazole (PROTONIX) 40 MG tablet [Pharmacy Med Name: PANTOPRAZOLE SOD DR 40 MG TAB] 90 tablet 0    Sig: TAKE 1 TABLET BY MOUTH EVERY DAY     Gastroenterology: Proton Pump Inhibitors Passed - 09/18/2023  5:24 PM      Passed - Valid encounter within last 12 months    Recent Outpatient Visits           5 months ago Chronic bilateral low back pain with sciatica, sciatica laterality unspecified   [facility] George Matar, MD   7 months ago Prediabetes   [facility] George Matar, MD   1 year ago Essential hypertension    [facility] [place] [facility] George Matar, MD   1 year ago Encounter for [employer] annual wellness exam   [facility] George Franco, RN   2 years ago  Prediabetes   [place] [facility] [place], [place], [place]       Future Appointments             In 1 week George Matar, MD George Robbins             clopidogrel (PLAVIX) 75 MG tablet [Pharmacy Med Name: CLOPIDOGREL 75 MG TABLET] 90 tablet 0    Sig: TAKE 1 TABLET BY MOUTH ONCE DAILY WITH BREAKFAST     Hematology: Antiplatelets - clopidogrel Failed - 09/18/2023  5:24 PM      Failed - HGB in normal range and within 180 days    Hemoglobin  Date Value Ref Range Status  04/05/2023 12.8 (L) 13.0 - 17.7 g/dL Final         Passed - HCT in normal range and within 180 days    Hematocrit  Date Value Ref Range Status  04/05/2023 40.0 37.5 - 51.0 % Final         Passed - PLT in normal range and within 180 days    Platelets  Date Value Ref Range Status  04/05/2023 151 150 - 450 x10E3/uL Final         Passed - Cr in normal range and within 360 days    Creat  Date Value Ref Range Status  06/19/2016 1.56 (H) 0.70 - 1.25 mg/dL Final    Comment:      For patients > or = 71 years of age: The upper reference limit for Creatinine is approximately 13% higher for people identified as African-George.      Creatinine, Ser  Date Value Ref Range Status  04/05/2023 1.09 0.76 - 1.27 mg/dL Final         Passed - Valid encounter within last 6 months    Recent Outpatient Visits           5 months ago Chronic bilateral low back pain with sciatica, sciatica laterality unspecified   [place] [facility] George Matar, MD   7 months ago Prediabetes   [facility] George Matar, MD   1 year ago Essential hypertension   [place] [facility] [place] [facility] [place] [facility] [place] George Matar, MD   1 year ago Encounter for [employer] annual wellness exam   [facility] George Franco, RN   2 years ago Prediabetes   [place] [facility] [place], George Robbins, [place]       Future Appointments             In 1 week George Matar, MD [place] [facility] [place]             levothyroxine (SYNTHROID) 112 MCG tablet [Pharmacy Med Name: LEVOTHYROXINE 112 MCG TABLET] 90 tablet 0    Sig: TAKE 1 TABLET (112 MCG TOTAL) BY MOUTH DAILY BEFORE BREAKFAST. DOSE DECREASE     Endocrinology:  Hypothyroid Agents Passed - 09/18/2023  5:24 PM      Passed - TSH in normal range and within 360 days    TSH  Date Value Ref Range Status  05/31/2023 0.707 0.450 - 4.500 uIU/mL Final         Passed - Valid encounter within last 12 months    Recent Outpatient Visits           5 months ago Chronic  bilateral low back pain with sciatica, sciatica laterality unspecified   [place] [facility] George Matar, MD   7 months ago Prediabetes   [facility] George Matar, MD   1 year ago Essential hypertension   [place] [facility] [place] [facility] George Matar, MD   1 year ago Encounter for [employer] annual wellness exam   [place] [facility] George Franco, RN   2 years ago Prediabetes    [place] [facility] [place], George Robbins, [place]       Future Appointments             In 1 week George Matar, MD [facility]

## 2023-09-27 ENCOUNTER — Ambulatory Visit: Payer: Medicare HMO | Attending: Internal Medicine | Admitting: Internal Medicine

## 2023-09-27 VITALS — BP 111/69 | HR 57 | Temp 98.0°F | Ht 64.0 in | Wt 192.0 lb

## 2023-09-27 DIAGNOSIS — I10 Essential (primary) hypertension: Secondary | ICD-10-CM

## 2023-09-27 DIAGNOSIS — R7303 Prediabetes: Secondary | ICD-10-CM

## 2023-09-27 DIAGNOSIS — Z23 Encounter for immunization: Secondary | ICD-10-CM | POA: Diagnosis not present

## 2023-09-27 DIAGNOSIS — I2510 Atherosclerotic heart disease of native coronary artery without angina pectoris: Secondary | ICD-10-CM | POA: Diagnosis not present

## 2023-09-27 DIAGNOSIS — Z7984 Long term (current) use of oral hypoglycemic drugs: Secondary | ICD-10-CM

## 2023-09-27 DIAGNOSIS — J9611 Chronic respiratory failure with hypoxia: Secondary | ICD-10-CM | POA: Diagnosis not present

## 2023-09-27 DIAGNOSIS — Z87891 Personal history of nicotine dependence: Secondary | ICD-10-CM

## 2023-09-27 DIAGNOSIS — R350 Frequency of micturition: Secondary | ICD-10-CM

## 2023-09-27 DIAGNOSIS — Z9981 Dependence on supplemental oxygen: Secondary | ICD-10-CM

## 2023-09-27 DIAGNOSIS — E039 Hypothyroidism, unspecified: Secondary | ICD-10-CM

## 2023-09-27 DIAGNOSIS — N401 Enlarged prostate with lower urinary tract symptoms: Secondary | ICD-10-CM

## 2023-09-27 DIAGNOSIS — J439 Emphysema, unspecified: Secondary | ICD-10-CM | POA: Diagnosis not present

## 2023-09-27 MED ORDER — METFORMIN HCL 500 MG PO TABS: ORAL_TABLET | ORAL | 2 refills | Status: DC

## 2023-09-27 MED ORDER — CLOPIDOGREL BISULFATE 75 MG PO TABS: ORAL_TABLET | ORAL | 2 refills | Status: DC

## 2023-09-27 MED ORDER — ZOSTER VAC RECOMB ADJUVANTED 50 MCG/0.5ML IM SUSR: 0.5000 mL | Freq: Once | INTRAMUSCULAR | 0 refills | Status: AC

## 2023-09-27 MED ORDER — LEVOTHYROXINE SODIUM 112 MCG PO TABS: 112.0000 ug | ORAL_TABLET | Freq: Every day | ORAL | 2 refills | Status: DC

## 2023-09-27 MED ORDER — PANTOPRAZOLE SODIUM 40 MG PO TBEC: 40.0000 mg | DELAYED_RELEASE_TABLET | Freq: Every day | ORAL | 2 refills | Status: DC

## 2023-09-27 MED ORDER — TAMSULOSIN HCL 0.4 MG PO CAPS: 0.4000 mg | ORAL_CAPSULE | Freq: Every day | ORAL | 2 refills | Status: DC

## 2023-09-27 MED ORDER — BISOPROLOL FUMARATE 5 MG PO TABS: 5.0000 mg | ORAL_TABLET | Freq: Every day | ORAL | 2 refills | Status: DC
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
## 2023-10-04 ENCOUNTER — Ambulatory Visit: Payer: Medicare HMO | Admitting: Internal Medicine

## 2023-10-04 ENCOUNTER — Encounter: Payer: Self-pay | Admitting: Internal Medicine

## 2023-10-04 ENCOUNTER — Telehealth: Payer: Self-pay

## 2023-10-04 VITALS — BP 100/60 | HR 71 | Ht 64.0 in | Wt 192.0 lb

## 2023-10-04 DIAGNOSIS — K5909 Other constipation: Secondary | ICD-10-CM

## 2023-10-04 DIAGNOSIS — Z7902 Long term (current) use of antithrombotics/antiplatelets: Secondary | ICD-10-CM | POA: Diagnosis not present

## 2023-10-04 DIAGNOSIS — J439 Emphysema, unspecified: Secondary | ICD-10-CM | POA: Diagnosis not present

## 2023-10-04 DIAGNOSIS — Z860100 Personal history of colon polyps, unspecified: Secondary | ICD-10-CM | POA: Diagnosis not present

## 2023-10-04 MED ORDER — NA SULFATE-K SULFATE-MG SULF 17.5-3.13-1.6 GM/177ML PO SOLN: 1.0000 | Freq: Once | ORAL | 0 refills | Status: AC

## 2023-10-04 NOTE — Telephone Encounter (Signed)
Informed patient to hold Plavix 5 days prior to his procedure. Patient verbalized understanding.

## 2023-10-04 NOTE — Telephone Encounter (Signed)
   RHILEY SCHRANDT 09/18/1951 [id number]  Dear Dr. Laural Benes:  We have scheduled the above named patient for a(n) colonoscopy procedure. Our records show that (s)he is on anticoagulation therapy.  Please advise as to whether the patient may come off their therapy of Plavix 5 days prior to their procedure which is scheduled for 11/07/23.  Please route your response to Jovita Kussmaul, CMA.  Sincerely,    [facility]

## 2023-10-04 NOTE — Patient Instructions (Signed)
Please purchase the following medications over the counter and take as directed: Miralax daily.   You have been scheduled for a colonoscopy. Please follow written instructions given to you at your visit today.   Please pick up your prep supplies at the pharmacy within the next 1-3 days.  If you use inhalers (even only as needed), please bring them with you on the day of your procedure.  DO NOT TAKE 7 DAYS PRIOR TO TEST- Trulicity (dulaglutide) Ozempic, Wegovy (semaglutide) Mounjaro (tirzepatide) Bydureon Bcise (exanatide extended release)  DO NOT TAKE 1 DAY PRIOR TO YOUR TEST Rybelsus (semaglutide) Adlyxin (lixisenatide) Victoza (liraglutide) Byetta (exanatide) ___________________________________________________________________________  _______________________________________________________  If your blood pressure at your visit was 140/90 or greater, please contact your primary care physician to follow up on this.  _______________________________________________________  If you are age 71 or older, your body mass index should be between 23-30. Your Body mass index is 32.96 kg/m. If this is out of the aforementioned range listed, please consider follow up with your Primary Care Provider.  If you are age 75 or younger, your body mass index should be between 19-25. Your Body mass index is 32.96 kg/m. If this is out of the aformentioned range listed, please consider follow up with your Primary Care Provider.   ________________________________________________________  The [facility] providers would like to encourage you to use [facility] to communicate with providers for non-urgent requests or questions.  Due to long hold times on the telephone, sending your provider a message by [facility] may be a faster and more efficient way to get a response.  Please allow 48 business hours for a response.  Please remember that this is for non-urgent requests.   _______________________________________________________
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
## 2023-10-10 ENCOUNTER — Telehealth: Payer: Self-pay

## 2023-10-10 NOTE — Telephone Encounter (Signed)
-----   Message from [facility] sent at 10/10/2023  4:10 PM EST ----- Yes of course. I will tag my nurse in on message to call him to get set up on my schedule.   Brittyn Salaz please schedule him for f/up, first available for COPD .   Thanks  Tammy ----- Message ----- From: Beverley Fiedler, MD Sent: 10/04/2023  12:58 PM EST To: Julio Sicks, NP  Hi Tammy,  I saw this patient today who has significant COPD but has been lost to follow-up with pulmonary.  It looks like you saw him 3 years ago.  He was hoping he could reestablish care with pulmonary.  Could you help get him an appointment? Thanks Vonna Kotyk

## 2023-10-10 NOTE — Telephone Encounter (Signed)
Spoke to patient's son, Torryn(DPR) and scheduled appt 12/19/2022 at 11:00. Kojo requested a Friday and this was first available.  Nothing further needed.

## 2023-10-31 ENCOUNTER — Encounter (HOSPITAL_COMMUNITY): Payer: Self-pay | Admitting: Internal Medicine

## 2023-10-31 NOTE — Progress Notes (Signed)
Pre op call eval Name:Westyn Mayo Ao MD Cardiologist-Nahser MD Pulmonolgist-n/a  EKG-08/04/20 [id number] Cath-n/a Stress-2017 ICD/PM- n/a Blood thinner-n/a GLP-1- n/a  Hx:CAD,HTN,GERD,CKD3, COPD on chronic 02, pre DM.  Patient last saw cardiology back in 2021 they recommened f/u 1 year but pt hasnt seen. Pt reports no cardiac issues presently. At GI appt they noted he hadnt seen pulm in a few years either so they reached out and got him set up for appt to see in Jan. 2025. Per pt no new breathing issues, still using 2-3L 02 daily. Uses can for longer distances.  Anesthesia Review: Yes

## 2023-11-02 ENCOUNTER — Other Ambulatory Visit: Payer: Self-pay | Admitting: Internal Medicine

## 2023-11-02 DIAGNOSIS — I2510 Atherosclerotic heart disease of native coronary artery without angina pectoris: Secondary | ICD-10-CM

## 2023-11-05 NOTE — Telephone Encounter (Signed)
Requested Prescriptions  Pending Prescriptions Disp Refills   atorvastatin (LIPITOR) 40 MG tablet [Pharmacy Med Name: ATORVASTATIN 40 MG TABLET] 90 tablet 0    Sig: TAKE 1 TABLET BY MOUTH EVERY DAY     Cardiovascular:  Antilipid - Statins Failed - 11/02/2023  3:01 PM      Failed - Lipid Panel in normal range within the last 12 months    Cholesterol, Total  Date Value Ref Range Status  04/05/2023 141 100 - 199 mg/dL Final   LDL Chol Calc (NIH)  Date Value Ref Range Status  04/05/2023 65 0 - 99 mg/dL Final   HDL  Date Value Ref Range Status  04/05/2023 61 >39 mg/dL Final   Triglycerides  Date Value Ref Range Status  04/05/2023 76 0 - 149 mg/dL Final         Passed - Patient is not pregnant      Passed - Valid encounter within last 12 months    Recent Outpatient Visits           1 month ago Pulmonary emphysema, unspecified emphysema type (HCC)   [facility] [place]. [facility] Jonah Blue B, MD   6 months ago Chronic bilateral low back pain with sciatica, sciatica laterality unspecified   [facility] - [facility] Dept Of [place]. [facility] [name], MD   9 months ago Prediabetes   [facility] Dept Of [place]. [facility] [name], MD   1 year ago Essential hypertension   [facility] Dept Of [place]. [facility] [name], MD   1 year ago Encounter for [employer] annual wellness exam   [facility] - A Dept Of [place]. [facility] [name], RN       Future Appointments             In 3 months Laural Benes, Binnie Rail, MD [facility] - A Dept Of [place]igha [facility]. [facility]

## 2023-11-07 ENCOUNTER — Encounter (HOSPITAL_COMMUNITY)
Admission: RE | Disposition: A | Discharge status: Home / Self Care | Payer: Self-pay | Source: Home / Self Care | Attending: Internal Medicine

## 2023-11-07 ENCOUNTER — Ambulatory Visit (HOSPITAL_BASED_OUTPATIENT_CLINIC_OR_DEPARTMENT_OTHER): Payer: Medicare HMO | Admitting: Certified Registered Nurse Anesthetist

## 2023-11-07 ENCOUNTER — Ambulatory Visit (HOSPITAL_COMMUNITY)
Admission: RE | Admit: 2023-11-07 | Discharge: 2023-11-07 | Disposition: A | Discharge status: Home / Self Care | Payer: Medicare HMO | Attending: Internal Medicine | Admitting: Internal Medicine

## 2023-11-07 ENCOUNTER — Other Ambulatory Visit: Payer: Self-pay

## 2023-11-07 ENCOUNTER — Encounter (HOSPITAL_COMMUNITY): Payer: Self-pay | Admitting: Internal Medicine

## 2023-11-07 ENCOUNTER — Ambulatory Visit (HOSPITAL_COMMUNITY): Payer: Medicare HMO | Admitting: Certified Registered Nurse Anesthetist

## 2023-11-07 DIAGNOSIS — Z1211 Encounter for screening for malignant neoplasm of colon: Secondary | ICD-10-CM | POA: Insufficient documentation

## 2023-11-07 DIAGNOSIS — Z955 Presence of coronary angioplasty implant and graft: Secondary | ICD-10-CM | POA: Insufficient documentation

## 2023-11-07 DIAGNOSIS — I2510 Atherosclerotic heart disease of native coronary artery without angina pectoris: Secondary | ICD-10-CM | POA: Insufficient documentation

## 2023-11-07 DIAGNOSIS — Z87891 Personal history of nicotine dependence: Secondary | ICD-10-CM | POA: Insufficient documentation

## 2023-11-07 DIAGNOSIS — K5730 Diverticulosis of large intestine without perforation or abscess without bleeding: Secondary | ICD-10-CM | POA: Insufficient documentation

## 2023-11-07 DIAGNOSIS — G4730 Sleep apnea, unspecified: Secondary | ICD-10-CM | POA: Insufficient documentation

## 2023-11-07 DIAGNOSIS — Z7902 Long term (current) use of antithrombotics/antiplatelets: Secondary | ICD-10-CM | POA: Diagnosis not present

## 2023-11-07 DIAGNOSIS — Z7982 Long term (current) use of aspirin: Secondary | ICD-10-CM | POA: Diagnosis not present

## 2023-11-07 DIAGNOSIS — K648 Other hemorrhoids: Secondary | ICD-10-CM | POA: Diagnosis not present

## 2023-11-07 DIAGNOSIS — I129 Hypertensive chronic kidney disease with stage 1 through stage 4 chronic kidney disease, or unspecified chronic kidney disease: Secondary | ICD-10-CM | POA: Insufficient documentation

## 2023-11-07 DIAGNOSIS — Z7984 Long term (current) use of oral hypoglycemic drugs: Secondary | ICD-10-CM | POA: Diagnosis not present

## 2023-11-07 DIAGNOSIS — J449 Chronic obstructive pulmonary disease, unspecified: Secondary | ICD-10-CM | POA: Diagnosis not present

## 2023-11-07 DIAGNOSIS — Z79899 Other long term (current) drug therapy: Secondary | ICD-10-CM | POA: Insufficient documentation

## 2023-11-07 DIAGNOSIS — Z860101 Personal history of adenomatous and serrated colon polyps: Secondary | ICD-10-CM

## 2023-11-07 DIAGNOSIS — E039 Hypothyroidism, unspecified: Secondary | ICD-10-CM | POA: Insufficient documentation

## 2023-11-07 DIAGNOSIS — Z791 Long term (current) use of non-steroidal anti-inflammatories (NSAID): Secondary | ICD-10-CM | POA: Diagnosis not present

## 2023-11-07 DIAGNOSIS — Z79890 Hormone replacement therapy (postmenopausal): Secondary | ICD-10-CM | POA: Diagnosis not present

## 2023-11-07 DIAGNOSIS — K219 Gastro-esophageal reflux disease without esophagitis: Secondary | ICD-10-CM | POA: Diagnosis not present

## 2023-11-07 DIAGNOSIS — Z9981 Dependence on supplemental oxygen: Secondary | ICD-10-CM | POA: Diagnosis not present

## 2023-11-07 DIAGNOSIS — N183 Chronic kidney disease, stage 3 unspecified: Secondary | ICD-10-CM | POA: Insufficient documentation

## 2023-11-07 DIAGNOSIS — Z860100 Personal history of colon polyps, unspecified: Secondary | ICD-10-CM

## 2023-11-07 HISTORY — PX: COLONOSCOPY WITH PROPOFOL: SHX5780

## 2023-11-07 SURGERY — COLONOSCOPY WITH PROPOFOL
Anesthesia: Monitor Anesthesia Care

## 2023-11-07 MED ORDER — PROPOFOL 500 MG/50ML IV EMUL
INTRAVENOUS | Status: DC | PRN
  Administered 2023-11-07: 100 ug/kg/min via INTRAVENOUS

## 2023-11-07 MED ORDER — PROPOFOL 1000 MG/100ML IV EMUL
INTRAVENOUS | Status: AC
  Filled 2023-11-07: qty 100

## 2023-11-07 MED ORDER — PROPOFOL 10 MG/ML IV BOLUS
INTRAVENOUS | Status: DC | PRN
  Administered 2023-11-07: 30 mg via INTRAVENOUS
  Administered 2023-11-07: 20 mg via INTRAVENOUS

## 2023-11-07 MED ORDER — SODIUM CHLORIDE 0.9 % IV SOLN: INTRAVENOUS | Status: DC

## 2023-11-07 SURGICAL SUPPLY — 21 items
ELECT REM PT RETURN 9FT ADLT (ELECTROSURGICAL)
ELECTRODE REM PT RTRN 9FT ADLT (ELECTROSURGICAL) IMPLANT
FCP BXJMBJMB 240X2.8X (CUTTING FORCEPS)
FLOOR PAD 36X40 (MISCELLANEOUS) ×1
FORCEPS BIOP RAD 4 LRG CAP 4 (CUTTING FORCEPS) IMPLANT
FORCEPS BIOP RJ4 240 W/NDL (CUTTING FORCEPS)
FORCEPS BXJMBJMB 240X2.8X (CUTTING FORCEPS) IMPLANT
INJECTOR/SNARE I SNARE (MISCELLANEOUS) IMPLANT
LUBRICANT JELLY 4.5OZ STERILE (MISCELLANEOUS) IMPLANT
MANIFOLD NEPTUNE II (INSTRUMENTS) IMPLANT
NDL SCLEROTHERAPY 25GX240 (NEEDLE) IMPLANT
NEEDLE SCLEROTHERAPY 25GX240 (NEEDLE)
PAD FLOOR 36X40 (MISCELLANEOUS) ×1 IMPLANT
PROBE APC STR FIRE (PROBE) IMPLANT
PROBE INJECTION GOLD 7FR (MISCELLANEOUS) IMPLANT
SNARE ROTATE MED OVAL 20MM (MISCELLANEOUS) IMPLANT
SYR 50ML LL SCALE MARK (SYRINGE) IMPLANT
TRAP SPECIMEN MUCOUS 40CC (MISCELLANEOUS) IMPLANT
TUBING ENDO SMARTCAP PENTAX (MISCELLANEOUS) IMPLANT
TUBING IRRIGATION ENDOGATOR (MISCELLANEOUS) ×1 IMPLANT
WATER STERILE IRR 1000ML POUR (IV SOLUTION) IMPLANT

## 2023-11-07 NOTE — Anesthesia Postprocedure Evaluation (Signed)
Anesthesia Post Note  Patient: Lonzie Toran.  Procedure(s) Performed: COLONOSCOPY WITH PROPOFOL     Patient location during evaluation: Endoscopy Anesthesia Type: MAC Level of consciousness: awake and alert Pain management: pain level controlled Vital Signs Assessment: post-procedure vital signs reviewed and stable Respiratory status: spontaneous breathing, nonlabored ventilation, respiratory function stable and patient connected to nasal cannula oxygen Cardiovascular status: stable and blood pressure returned to baseline Postop Assessment: no apparent nausea or vomiting Anesthetic complications: no  No notable events documented.  Last Vitals:  Vitals:   11/07/23 0940 11/07/23 0950  BP: (!) 148/64 (!) 140/69  Pulse: 65 (!) 53  Resp: 15 (!) 21  Temp:    SpO2: 99% 99%    Last Pain:  Vitals:   11/07/23 0940  TempSrc:   PainSc: 0-No pain                 Christoffer Currier,W. EDMOND

## 2023-11-07 NOTE — Op Note (Signed)
[facility] Patient Name: George Robbins Procedure Date: 11/07/2023 MRN: [id number] Attending MD: Beverley Fiedler , MD, [id number] Date of Birth: 1951-03-20 CSN: [id number] Age: 72 Admit Type: Outpatient Procedure:                Colonoscopy Indications:              High risk colon cancer surveillance: Personal                            history of polyps including adenoma (10 mm or                            greater in size), Last colonoscopy: July 2017 (TA x                            3, one > 1 cm) Providers:                Carie Caddy. Rhea Belton, MD, Suzy Bouchard, RN, Kandice Robinsons, Technician Referring MD:             [name] Medicines:                Monitored Anesthesia Care Complications:            No immediate complications. Estimated Blood Loss:     Estimated blood loss: none. Procedure:                Pre-Anesthesia Assessment:                           - Prior to the procedure, a History and Physical                            was performed, and patient medications and                            allergies were reviewed. The patient's tolerance of                            previous anesthesia was also reviewed. The risks                            and benefits of the procedure and the sedation                            options and risks were discussed with the patient.                            All questions were answered, and informed consent                            was obtained. Prior Anticoagulants: The patient has  taken Plavix (clopidogrel), last dose was 7 days                            prior to procedure. ASA Grade Assessment: III - A                            patient with severe systemic disease. After                            reviewing the risks and benefits, the patient was                            deemed in satisfactory condition to undergo the                             procedure.                           After obtaining informed consent, the colonoscope                            was passed under direct vision. Throughout the                            procedure, the patient's blood pressure, pulse, and                            oxygen saturations were monitored continuously. The                            [id number]-HQ190L ([id number]) Olympus colonoscope was                            introduced through the anus and advanced to the                            cecum, identified by appendiceal orifice and                            ileocecal valve. The colonoscopy was performed                            without difficulty. The patient tolerated the                            procedure well. The quality of the bowel                            preparation was excellent. The ileocecal valve,                            appendiceal orifice, and rectum were photographed. Scope In: 9:11:27 AM Scope Out: 9:26:01 AM Scope Withdrawal Time: 0 hours 11 minutes 55 seconds  Total Procedure Duration: 0 hours 14 minutes 34  seconds  Findings:      The digital rectal exam was normal.      Multiple small-mouthed diverticula were found in the sigmoid colon.      Internal hemorrhoids were found during retroflexion. The hemorrhoids       were small.      The exam was otherwise without abnormality. Impression:               - Mild diverticulosis in the sigmoid colon.                           - Small internal hemorrhoids.                           - The examination was otherwise normal. No polyps                            found today.                           - No specimens collected. Moderate Sedation:      N/A Recommendation:           - Patient has a contact number available for                            emergencies. The signs and symptoms of potential                            delayed complications were discussed with the                            patient. Return to  normal activities tomorrow.                            Written discharge instructions were provided to the                            patient.                           - Resume previous diet.                           - Continue present medications.                           - No recommendation at this time regarding repeat                            colonoscopy due to age and no polyps on today's                            exam.                           - Resume Plavix (clopidogrel) at prior dose today.  Refer to managing physician for further adjustment                            of therapy. Procedure Code(s):        --- Professional ---                           N8295, Colorectal cancer screening; colonoscopy on                            individual at high risk Diagnosis Code(s):        --- Professional ---                           Z86.010, Personal history of colonic polyps                           K64.8, Other hemorrhoids                           K57.30, Diverticulosis of large intestine without                            perforation or abscess without bleeding CPT copyright 2022 American Medical Association. All rights reserved. The codes documented in this report are preliminary and upon coder review may  be revised to meet current compliance requirements. Beverley Fiedler, MD 11/07/2023 9:37:44 AM This report has been signed electronically. Number of Addenda: 0

## 2023-11-07 NOTE — Anesthesia Procedure Notes (Signed)
Procedure Name: MAC Date/Time: 11/07/2023 8:55 AM  Performed by: Elyn Peers, CRNAPre-anesthesia Checklist: Patient identified, Emergency Drugs available, Suction available, Patient being monitored and Timeout performed Oxygen Delivery Method: Simple face mask Placement Confirmation: positive ETCO2

## 2023-11-07 NOTE — Discharge Instructions (Signed)
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
## 2023-11-07 NOTE — Anesthesia Preprocedure Evaluation (Addendum)
Anesthesia Evaluation  Patient identified by MRN, date of birth, ID band Patient awake    Reviewed: Allergy & Precautions, H&P , NPO status , Patient's Chart, lab work & pertinent test results, reviewed documented beta blocker date and time   Airway Mallampati: II  TM Distance: >3 FB Neck ROM: Full    Dental no notable dental hx. (+) Poor Dentition, Dental Advisory Given   Pulmonary sleep apnea , COPD,  COPD inhaler and oxygen dependent, former smoker   Pulmonary exam normal breath sounds clear to auscultation       Cardiovascular hypertension, Pt. on medications and Pt. on home beta blockers + CAD and + Cardiac Stents   Rhythm:Regular Rate:Normal     Neuro/Psych negative neurological ROS  negative psych ROS   GI/Hepatic Neg liver ROS,GERD  ,,  Endo/Other  Hypothyroidism    Renal/GU Renal InsufficiencyRenal disease  negative genitourinary   Musculoskeletal   Abdominal   Peds  Hematology negative hematology ROS (+)   Anesthesia Other Findings   Reproductive/Obstetrics negative OB ROS                             Anesthesia Physical Anesthesia Plan  ASA: 3  Anesthesia Plan: MAC   Post-op Pain Management: Minimal or no pain anticipated   Induction: Intravenous  PONV Risk Score and Plan: 1 and Propofol infusion  Airway Management Planned: Natural Airway and Simple Face Mask  Additional Equipment:   Intra-op Plan:   Post-operative Plan:   Informed Consent: I have reviewed the patients History and Physical, chart, labs and discussed the procedure including the risks, benefits and alternatives for the proposed anesthesia with the patient or authorized representative who has indicated his/her understanding and acceptance.     Dental advisory given  Plan Discussed with: CRNA  Anesthesia Plan Comments:        Anesthesia Quick Evaluation

## 2023-11-07 NOTE — Transfer of Care (Signed)
Immediate Anesthesia Transfer of Care Note  Patient: George Robbins.  Procedure(s) Performed: COLONOSCOPY WITH PROPOFOL  Patient Location: PACU and Endoscopy Unit  Anesthesia Type:MAC  Level of Consciousness: awake, alert , oriented, and patient cooperative  Airway & Oxygen Therapy: Patient Spontanous Breathing and Patient connected to face mask oxygen  Post-op Assessment: Report given to RN and Post -op Vital signs reviewed and stable  Post vital signs: Reviewed and stable  Last Vitals:  Vitals Value Taken Time  BP 121/64 11/07/23 0931  Temp    Pulse 68 11/07/23 0933  Resp 29 11/07/23 0933  SpO2 100 % 11/07/23 0933  Vitals shown include unfiled device data.  Last Pain:  Vitals:   11/07/23 0820  TempSrc: Temporal  PainSc: 0-No pain         Complications: No notable events documented.

## 2023-11-08 ENCOUNTER — Ambulatory Visit: Payer: Medicare HMO | Admitting: Podiatry

## 2023-11-11 ENCOUNTER — Encounter (HOSPITAL_COMMUNITY): Payer: Self-pay | Admitting: Internal Medicine

## 2023-11-15 ENCOUNTER — Ambulatory Visit: Payer: Medicare HMO | Admitting: Podiatry

## 2023-11-18 ENCOUNTER — Other Ambulatory Visit: Payer: Self-pay | Admitting: Internal Medicine

## 2023-11-18 DIAGNOSIS — J439 Emphysema, unspecified: Secondary | ICD-10-CM

## 2023-11-22 ENCOUNTER — Encounter: Payer: Self-pay | Admitting: Podiatry

## 2023-11-22 ENCOUNTER — Ambulatory Visit: Payer: Medicare HMO | Admitting: Podiatry

## 2023-11-22 DIAGNOSIS — M79675 Pain in left toe(s): Secondary | ICD-10-CM

## 2023-11-22 DIAGNOSIS — M79674 Pain in right toe(s): Secondary | ICD-10-CM

## 2023-11-22 DIAGNOSIS — B351 Tinea unguium: Secondary | ICD-10-CM | POA: Diagnosis not present
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
## 2023-11-23 ENCOUNTER — Other Ambulatory Visit: Payer: Self-pay | Admitting: Internal Medicine

## 2023-11-23 DIAGNOSIS — J439 Emphysema, unspecified: Secondary | ICD-10-CM

## 2023-12-20 ENCOUNTER — Encounter: Payer: Self-pay | Admitting: Emergency Medicine

## 2023-12-20 ENCOUNTER — Ambulatory Visit: Payer: Medicare HMO | Admitting: Emergency Medicine

## 2023-12-20 VITALS — BP 109/69 | HR 61 | Ht 64.0 in | Wt 196.0 lb

## 2023-12-20 DIAGNOSIS — J9611 Chronic respiratory failure with hypoxia: Secondary | ICD-10-CM

## 2023-12-20 DIAGNOSIS — J301 Allergic rhinitis due to pollen: Secondary | ICD-10-CM

## 2023-12-20 DIAGNOSIS — R040 Epistaxis: Secondary | ICD-10-CM | POA: Insufficient documentation

## 2023-12-20 DIAGNOSIS — J309 Allergic rhinitis, unspecified: Secondary | ICD-10-CM | POA: Insufficient documentation

## 2023-12-20 DIAGNOSIS — J449 Chronic obstructive pulmonary disease, unspecified: Secondary | ICD-10-CM

## 2023-12-20 DIAGNOSIS — G4733 Obstructive sleep apnea (adult) (pediatric): Secondary | ICD-10-CM | POA: Diagnosis not present

## 2023-12-20 DIAGNOSIS — J439 Emphysema, unspecified: Secondary | ICD-10-CM

## 2023-12-20 NOTE — Assessment & Plan Note (Signed)
Continue your Trelegy 1 inhalation once daily.  Rinse and gargle after using. Keep albuterol available to use 2 puffs up to every 4 hours if needed for shortness of breath, chest tightness, wheezing. Follow with Dr. Delton Coombes in 1 year, sooner if you have any problems

## 2023-12-20 NOTE — Assessment & Plan Note (Signed)
Continue your oxygen at 3 L/min. Consider getting a pulse oximeter so you can do spot checks on your oxygen saturation.  Our goal is for you to be above 90%
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
## 2023-12-20 NOTE — Assessment & Plan Note (Signed)
Intermittent epistaxis associated with dryness on his oxygen (also on fluticasone no spray) Try using nasal saline gel or nasal saline spray to keep your nose moist and avoid dryness and nosebleeding

## 2023-12-20 NOTE — Assessment & Plan Note (Signed)
Continue your Zyrtec and fluticasone nasal spray as you have been taking them.

## 2023-12-20 NOTE — Patient Instructions (Addendum)
Please continue your CPAP every night as you have been wearing it.  We confirmed good compliance and good clinical benefit today.  We will order you a new machine since hers is older than 5 years.  Same settings. Continue your oxygen at 3 L/min. Consider getting a pulse oximeter so you can do spot checks on your oxygen saturation.  Our goal is for you to be above 90%. Try using nasal saline gel or nasal saline spray to keep your nose moist and avoid dryness and nosebleeding. Continue your Trelegy 1 inhalation once daily.  Rinse and gargle after using. Keep albuterol available to use 2 puffs up to every 4 hours if needed for shortness of breath, chest tightness, wheezing. Continue your Zyrtec and fluticasone nasal spray as you have been taking them. Follow with APP in 6 months to review your status. Follow with Dr. Delton Coombes in 1 year, sooner if you have any problems

## 2023-12-20 NOTE — Assessment & Plan Note (Signed)
Please continue your CPAP every night as you have been wearing it.  We confirmed good compliance and good clinical benefit today.  We will order you a new machine since hers is older than 5 years.  Same settings.

## 2023-12-27 ENCOUNTER — Other Ambulatory Visit: Payer: Self-pay | Admitting: Internal Medicine

## 2023-12-27 ENCOUNTER — Telehealth: Payer: Self-pay | Admitting: Internal Medicine

## 2023-12-27 DIAGNOSIS — I25119 Atherosclerotic heart disease of native coronary artery with unspecified angina pectoris: Secondary | ICD-10-CM

## 2023-12-27 NOTE — Telephone Encounter (Signed)
Requested Prescriptions  Pending Prescriptions Disp Refills   ezetimibe (ZETIA) 10 MG tablet [Pharmacy Med Name: EZETIMIBE 10 MG TABLET] 90 tablet 0    Sig: TAKE 1 TABLET BY MOUTH EVERY DAY     Cardiovascular:  Antilipid - Sterol Transport Inhibitors Failed - 12/27/2023  1:53 PM      Failed - Lipid Panel in normal range within the last 12 months    Cholesterol, Total  Date Value Ref Range Status  04/05/2023 141 100 - 199 mg/dL Final   LDL Chol Calc (NIH)  Date Value Ref Range Status  04/05/2023 65 0 - 99 mg/dL Final   HDL  Date Value Ref Range Status  04/05/2023 61 >39 mg/dL Final   Triglycerides  Date Value Ref Range Status  04/05/2023 76 0 - 149 mg/dL Final         Passed - AST in normal range and within 360 days    AST  Date Value Ref Range Status  04/05/2023 18 0 - 40 IU/L Final         Passed - ALT in normal range and within 360 days    ALT  Date Value Ref Range Status  04/05/2023 22 0 - 44 IU/L Final         Passed - Patient is not pregnant      Passed - Valid encounter within last 12 months    Recent Outpatient Visits           3 months ago Pulmonary emphysema, unspecified emphysema type (HCC)   [place] [facility] Dept Of [place]. [facility] Jonah Blue B, MD   8 months ago Chronic bilateral low back pain with sciatica, sciatica laterality unspecified   [place] [facility] - A Dept Of [place]. [facility] [name], MD   10 months ago Prediabetes   [place] [facility] [place] - A Dept Of [place]. [facility] [name], MD   1 year ago Essential hypertension   [place] [facility] - A Dept Of [place]. [facility] [name], MD   1 year ago Encounter for [employer] annual wellness exam   [place] [facility] [place] - A Dept Of [place]. [facility] [name], RN       Future Appointments             In 1 month  Laural Benes, Binnie Rail, MD [facility] - A Dept Of Eligha Bridegroom. [facility]

## 2023-12-27 NOTE — Telephone Encounter (Signed)
[facility] for patient, called patient and informed he can call the pharmacy and have them fill it   No other questions or concerns

## 2023-12-27 NOTE — Telephone Encounter (Signed)
Copied from CRM [id number]. Topic: Clinical - Medication Refill >> Dec 27, 2023  9:00 AM Bo Mcclintock wrote: Most Recent Primary Care Visit:  Provider: Jonah Blue B  Department[facility]  Visit Type: OFFICE VISIT  Date: 09/27/2023  Medication: levothyroxine (SYNTHROID) 112 MCG tablet  Has the patient contacted their pharmacy? Yes (Agent: If no, request that the patient contact the pharmacy for the refill. If patient does not wish to contact the pharmacy document the reason why and proceed with request.) (Agent: If yes, when and what did the pharmacy advise?) Contact PCP  Is this the correct pharmacy for this prescription? No If no, delete pharmacy and type the correct one.  This is the patient's preferred pharmacy:    [id number] [facility], [place] - [address] [place] [address] Phone: [phone] Fax: [phone]   Has the prescription been filled recently? No  Is the patient out of the medication? Yes  Has the patient been seen for an appointment in the last year OR does the patient have an upcoming appointment? Yes  Can we respond through MyChart? No  Agent: Please be advised that Rx refills may take up to 3 business days. We ask that you follow-up with your pharmacy.

## 2024-01-15 NOTE — Telephone Encounter (Signed)
Copied from CRM [id number]. Topic: Clinical - Medical Advice >> Jan 15, 2024 10:22 AM Hector Shade B wrote: Reason for CRM: Marchelle Folks from [employer] called and stated patient is wanting kidney function and A1c added to up and coming visit.  [phone] Jethro [employer]

## 2024-01-15 NOTE — Telephone Encounter (Signed)
Patient upcoming appointment is on 02/07/2023

## 2024-02-07 ENCOUNTER — Encounter: Payer: Self-pay | Admitting: Internal Medicine

## 2024-02-07 ENCOUNTER — Ambulatory Visit: Payer: Medicare HMO | Attending: Internal Medicine | Admitting: Internal Medicine

## 2024-02-07 VITALS — BP 119/73 | HR 63 | Ht 64.0 in | Wt 192.0 lb

## 2024-02-07 DIAGNOSIS — Z9981 Dependence on supplemental oxygen: Secondary | ICD-10-CM

## 2024-02-07 DIAGNOSIS — R7303 Prediabetes: Secondary | ICD-10-CM | POA: Diagnosis not present

## 2024-02-07 DIAGNOSIS — I10 Essential (primary) hypertension: Secondary | ICD-10-CM

## 2024-02-07 DIAGNOSIS — E039 Hypothyroidism, unspecified: Secondary | ICD-10-CM

## 2024-02-07 DIAGNOSIS — J9611 Chronic respiratory failure with hypoxia: Secondary | ICD-10-CM

## 2024-02-07 DIAGNOSIS — Z23 Encounter for immunization: Secondary | ICD-10-CM

## 2024-02-07 DIAGNOSIS — J439 Emphysema, unspecified: Secondary | ICD-10-CM

## 2024-02-07 DIAGNOSIS — I2510 Atherosclerotic heart disease of native coronary artery without angina pectoris: Secondary | ICD-10-CM | POA: Diagnosis not present

## 2024-02-07 LAB — POCT GLYCOSYLATED HEMOGLOBIN (HGB A1C): HbA1c, POC (prediabetic range): 5.7 % (ref 5.7–6.4)

## 2024-02-07 LAB — GLUCOSE, POCT (MANUAL RESULT ENTRY): POC Glucose: 84 mg/dL (ref 70–99)

## 2024-02-07 MED ORDER — ATORVASTATIN CALCIUM 40 MG PO TABS: 40.0000 mg | ORAL_TABLET | Freq: Every day | ORAL | 1 refills | Status: DC

## 2024-02-07 MED ORDER — ZOSTER VAC RECOMB ADJUVANTED 50 MCG/0.5ML IM SUSR: 0.5000 mL | Freq: Once | INTRAMUSCULAR | 0 refills | Status: AC

## 2024-02-07 MED ORDER — ZOSTER VAC RECOMB ADJUVANTED 50 MCG/0.5ML IM SUSR: 0.5000 mL | Freq: Once | INTRAMUSCULAR | 0 refills | Status: DC
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
## 2024-02-08 LAB — COMPREHENSIVE METABOLIC PANEL
ALT: 20 IU/L (ref 0–44)
AST: 19 IU/L (ref 0–40)
Albumin: 4.7 g/dL (ref 3.8–4.8)
Alkaline Phosphatase: 104 IU/L (ref 44–121)
BUN/Creatinine Ratio: 13 (ref 10–24)
BUN: 14 mg/dL (ref 8–27)
Bilirubin Total: 0.3 mg/dL (ref 0.0–1.2)
CO2: 27 mmol/L (ref 20–29)
Calcium: 10.3 mg/dL — ABNORMAL HIGH (ref 8.6–10.2)
Chloride: 99 mmol/L (ref 96–106)
Creatinine, Ser: 1.12 mg/dL (ref 0.76–1.27)
Globulin, Total: 2.7 g/dL (ref 1.5–4.5)
Glucose: 92 mg/dL (ref 70–99)
Potassium: 4.4 mmol/L (ref 3.5–5.2)
Sodium: 144 mmol/L (ref 134–144)
Total Protein: 7.4 g/dL (ref 6.0–8.5)
eGFR: 70 mL/min/{1.73_m2} (ref >59)

## 2024-02-08 LAB — CBC
Hematocrit: 40.5 % (ref 37.5–51.0)
Hemoglobin: 13.3 g/dL (ref 13.0–17.7)
MCH: 29.8 pg (ref 26.6–33.0)
MCHC: 32.8 g/dL (ref 31.5–35.7)
MCV: 91 fL (ref 79–97)
Platelets: 163 10*3/uL (ref 150–450)
RBC: 4.47 x10E6/uL (ref 4.14–5.80)
RDW: 14.2 % (ref 11.6–15.4)
WBC: 9.5 10*3/uL (ref 3.4–10.8)

## 2024-02-08 LAB — TSH: TSH: 2.34 u[IU]/mL (ref 0.450–4.500)

## 2024-02-09 ENCOUNTER — Other Ambulatory Visit: Payer: Self-pay | Admitting: Internal Medicine

## 2024-02-09 DIAGNOSIS — J439 Emphysema, unspecified: Secondary | ICD-10-CM

## 2024-02-21 ENCOUNTER — Encounter: Payer: Self-pay | Admitting: Podiatry

## 2024-02-21 ENCOUNTER — Ambulatory Visit: Payer: Medicare HMO | Admitting: Podiatry

## 2024-02-21 DIAGNOSIS — M79674 Pain in right toe(s): Secondary | ICD-10-CM | POA: Diagnosis not present

## 2024-02-21 DIAGNOSIS — B351 Tinea unguium: Secondary | ICD-10-CM | POA: Diagnosis not present

## 2024-02-21 DIAGNOSIS — M79675 Pain in left toe(s): Secondary | ICD-10-CM
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
## 2024-02-25 ENCOUNTER — Other Ambulatory Visit: Payer: Self-pay | Admitting: Internal Medicine

## 2024-02-25 DIAGNOSIS — J439 Emphysema, unspecified: Secondary | ICD-10-CM

## 2024-02-25 NOTE — Telephone Encounter (Signed)
 Requested medication (s) are due for refill today - yes  Requested medication (s) are on the active medication list -yes  Future visit scheduled -yes  Last refill: 11/25/23 60 each 2RF  Notes to clinic: non delegated Rx  Requested Prescriptions  Pending Prescriptions Disp Refills   TRELEGY ELLIPTA 100-62.5-25 MCG/ACT AEPB [Pharmacy Med Name: TRELEGY ELLIPTA 100-62.5-25] 60 each 2    Sig: INHALE 1 PUFF INTO THE LUNGS EVERY DAY     Off-Protocol Failed - 02/25/2024 12:40 PM      Failed - Medication not assigned to a protocol, review manually.      Passed - Valid encounter within last 12 months    Recent Outpatient Visits           2 weeks ago Essential hypertension   [place] [facility] [place] - A Dept Of [place]. [facility] [name], MD   5 months ago Pulmonary emphysema, unspecified emphysema type [name]   [place] [facility] - A Dept Of [place]. [facility] Jonah Blue B, MD   10 months ago Chronic bilateral low back pain with sciatica, sciatica laterality unspecified   [place] [facility] - A Dept Of [place]. [facility] [name], MD   1 year ago Prediabetes   [place] [facility] [place] - A Dept Of [place]. [facility] [name], MD   1 year ago Essential hypertension   [place] [facility] [place] - A Dept Of [place]. [facility] [name], MD       Future Appointments             In 3 months [name], MD [facility] - A Dept Of [facility]igh[facility]. [facility]               Requested Prescriptions  Pending Prescriptions Disp Refills   TRELEGY ELLIPTA 100-62.5-25 MCG/ACT AEPB [Pharmacy Med Name: TRELEGY ELLIPTA 100-62.5-25] 60 each 2    Sig: INHALE 1 PUFF INTO THE LUNGS EVERY DAY     Off-Protocol Failed - 02/25/2024 12:40 PM      Failed - Medication not assigned to a protocol, review manually.       Passed - Valid encounter within last 12 months    Recent Outpatient Visits           2 weeks ago Essential hypertension   [place] [facility] [place] - A Dept Of [place]. [facility] [name], MD   5 months ago Pulmonary emphysema, unspecified emphysema type [facility])   [place] [facility] - A Dept Of [place]. [facility] Jonah Blue B, MD   10 months ago Chronic bilateral low back pain with sciatica, sciatica laterality unspecified   [place] [facility] - A Dept Of [place]. [facility] [name], MD   1 year ago Prediabetes   [place] [facility] [place] - A Dept Of [place]. [facility] [name], MD   1 year ago Essential hypertension   [place] [facility] [place] - A Dept Of [place]. [facility] [name], MD       Future Appointments             In 3 months [name], MD [facility] [place] - A Dept Of [place]  [facility]

## 2024-03-20 ENCOUNTER — Other Ambulatory Visit: Payer: Self-pay | Admitting: Internal Medicine

## 2024-03-20 DIAGNOSIS — H1013 Acute atopic conjunctivitis, bilateral: Secondary | ICD-10-CM

## 2024-03-22 ENCOUNTER — Other Ambulatory Visit: Payer: Self-pay | Admitting: Internal Medicine

## 2024-03-22 DIAGNOSIS — I25119 Atherosclerotic heart disease of native coronary artery with unspecified angina pectoris: Secondary | ICD-10-CM

## 2024-05-14 ENCOUNTER — Other Ambulatory Visit: Payer: Self-pay | Admitting: Internal Medicine

## 2024-05-14 DIAGNOSIS — J439 Emphysema, unspecified: Secondary | ICD-10-CM

## 2024-05-21 ENCOUNTER — Other Ambulatory Visit: Payer: Self-pay | Admitting: Family Medicine

## 2024-05-21 DIAGNOSIS — J439 Emphysema, unspecified: Secondary | ICD-10-CM

## 2024-05-22 ENCOUNTER — Ambulatory Visit (INDEPENDENT_AMBULATORY_CARE_PROVIDER_SITE_OTHER): Admitting: Podiatry

## 2024-05-22 DIAGNOSIS — Z91199 Patient's noncompliance with other medical treatment and regimen due to unspecified reason: Secondary | ICD-10-CM

## 2024-05-22 NOTE — Progress Notes (Signed)
 Cancel 24 hours

## 2024-06-11 ENCOUNTER — Ambulatory Visit: Admitting: Podiatry

## 2024-06-12 ENCOUNTER — Encounter: Payer: Self-pay | Admitting: Podiatry

## 2024-06-12 ENCOUNTER — Ambulatory Visit: Admitting: Podiatry

## 2024-06-12 ENCOUNTER — Other Ambulatory Visit: Payer: Self-pay | Admitting: Internal Medicine

## 2024-06-12 ENCOUNTER — Ambulatory Visit: Admitting: Internal Medicine

## 2024-06-12 DIAGNOSIS — M79675 Pain in left toe(s): Secondary | ICD-10-CM

## 2024-06-12 DIAGNOSIS — B351 Tinea unguium: Secondary | ICD-10-CM | POA: Diagnosis not present

## 2024-06-12 DIAGNOSIS — H1013 Acute atopic conjunctivitis, bilateral: Secondary | ICD-10-CM

## 2024-06-12 DIAGNOSIS — M79674 Pain in right toe(s): Secondary | ICD-10-CM | POA: Diagnosis not present
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
## 2024-06-18 ENCOUNTER — Other Ambulatory Visit: Payer: Self-pay | Admitting: Family Medicine

## 2024-06-18 DIAGNOSIS — J439 Emphysema, unspecified: Secondary | ICD-10-CM

## 2024-06-20 ENCOUNTER — Other Ambulatory Visit: Payer: Self-pay | Admitting: Internal Medicine

## 2024-06-20 DIAGNOSIS — E039 Hypothyroidism, unspecified: Secondary | ICD-10-CM

## 2024-08-01 ENCOUNTER — Other Ambulatory Visit: Payer: Self-pay | Admitting: Internal Medicine

## 2024-08-01 DIAGNOSIS — I2510 Atherosclerotic heart disease of native coronary artery without angina pectoris: Secondary | ICD-10-CM

## 2024-08-07 ENCOUNTER — Ambulatory Visit: Attending: Internal Medicine | Admitting: Internal Medicine

## 2024-08-07 ENCOUNTER — Encounter: Payer: Self-pay | Admitting: Internal Medicine

## 2024-08-07 VITALS — BP 125/76 | HR 58 | Temp 98.1°F | Ht 64.0 in | Wt 185.0 lb

## 2024-08-07 DIAGNOSIS — Z7982 Long term (current) use of aspirin: Secondary | ICD-10-CM

## 2024-08-07 DIAGNOSIS — Z9981 Dependence on supplemental oxygen: Secondary | ICD-10-CM

## 2024-08-07 DIAGNOSIS — R7303 Prediabetes: Secondary | ICD-10-CM

## 2024-08-07 DIAGNOSIS — I10 Essential (primary) hypertension: Secondary | ICD-10-CM

## 2024-08-07 DIAGNOSIS — Z23 Encounter for immunization: Secondary | ICD-10-CM | POA: Diagnosis not present

## 2024-08-07 DIAGNOSIS — J439 Emphysema, unspecified: Secondary | ICD-10-CM | POA: Diagnosis not present

## 2024-08-07 DIAGNOSIS — Z7984 Long term (current) use of oral hypoglycemic drugs: Secondary | ICD-10-CM

## 2024-08-07 DIAGNOSIS — Z125 Encounter for screening for malignant neoplasm of prostate: Secondary | ICD-10-CM

## 2024-08-07 DIAGNOSIS — Z79899 Other long term (current) drug therapy: Secondary | ICD-10-CM

## 2024-08-07 DIAGNOSIS — I2510 Atherosclerotic heart disease of native coronary artery without angina pectoris: Secondary | ICD-10-CM

## 2024-08-07 DIAGNOSIS — R040 Epistaxis: Secondary | ICD-10-CM

## 2024-08-07 DIAGNOSIS — J9611 Chronic respiratory failure with hypoxia: Secondary | ICD-10-CM

## 2024-08-07 DIAGNOSIS — E039 Hypothyroidism, unspecified: Secondary | ICD-10-CM

## 2024-08-07 MED ORDER — ZOSTER VAC RECOMB ADJUVANTED 50 MCG/0.5ML IM SUSR: 0.5000 mL | Freq: Once | INTRAMUSCULAR | 0 refills | Status: AC
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
## 2024-08-07 NOTE — Patient Instructions (Signed)
 VISIT SUMMARY:  Today, you came in for a follow-up on your chronic medical conditions, including COPD, hypertension, coronary artery disease, obstructive sleep apnea, and prediabetes. You reported a decrease in nosebleeds since using a nasal spray and feeling refreshed in the mornings after using your CPAP machine. You also mentioned experiencing a mild cough after eating and some swallowing difficulties, which have been helped by drinking liquids after meals. We discussed your medication timing, particularly for atorvastatin , and addressed your concerns about side effects.  YOUR PLAN:  -CHRONIC OBSTRUCTIVE PULMONARY DISEASE (COPD): COPD is a chronic lung condition that makes it hard to breathe. To help manage your symptoms, you will be prescribed a nebulizer machine with liquid albuterol  for flare-ups. Please remember to chew your food well and drink fluids after meals to help with swallowing.  -EPISTAXIS (RECURRENT): Epistaxis means frequent nosebleeds. Your morning nosebleeds are likely worsened by your medications and oxygen  use. You will be referred to an ENT specialist for evaluation and possible cauterization. Using a humidifier at night and continuing with saline nasal spray can help manage dryness.  -OBSTRUCTIVE SLEEP APNEA: Obstructive sleep apnea is a condition where your breathing stops and starts during sleep. You are using a CPAP machine at night and feeling refreshed in the mornings, which is a good sign.  -CORONARY ARTERY DISEASE: Coronary artery disease is a condition where the heart's blood vessels are narrowed or blocked. You are continuing your therapy with aspirin  and clopidogrel , and it's good that you have not experienced any chest pain or leg swelling.  -HYPERTENSION: Hypertension is high blood pressure. You are taking bisoprolol  to manage this condition.  -HYPERLIPIDEMIA: Hyperlipidemia means having high levels of cholesterol in your blood. You are taking atorvastatin  and  Zetia  to manage this. To minimize side effects, take atorvastatin  at a time that works best for you.   -HYPOTHYROIDISM: Hypothyroidism is a condition where your thyroid  gland doesn't produce enough hormones. Continue taking levothyroxine  in the morning as instructed.  -PREDIABETES: Prediabetes means your blood sugar levels are higher than normal but not high enough to be classified as diabetes. Continue taking metformin  as prescribed.  INSTRUCTIONS:  1. Follow up with an ENT specialist for your recurrent nosebleeds. 2. Use a humidifier at night and continue using saline nasal spray to manage nasal dryness. 3. Take atorvastatin  at a time that minimizes side effects. 4. Use the nebulizer machine with liquid albuterol  for COPD flare-ups. 5. Chew your food well and drink fluids after meals to help with swallowing.

## 2024-08-08 ENCOUNTER — Ambulatory Visit: Payer: Self-pay | Admitting: Internal Medicine

## 2024-08-08 DIAGNOSIS — E039 Hypothyroidism, unspecified: Secondary | ICD-10-CM

## 2024-08-08 LAB — LIPID PANEL
Chol/HDL Ratio: 2.5 ratio (ref 0.0–5.0)
Cholesterol, Total: 156 mg/dL (ref 100–199)
HDL: 62 mg/dL (ref >39)
LDL Chol Calc (NIH): 78 mg/dL (ref 0–99)
Triglycerides: 87 mg/dL (ref 0–149)
VLDL Cholesterol Cal: 16 mg/dL (ref 5–40)

## 2024-08-08 LAB — CBC
Hematocrit: 45 % (ref 37.5–51.0)
Hemoglobin: 14.6 g/dL (ref 13.0–17.7)
MCH: 29.7 pg (ref 26.6–33.0)
MCHC: 32.4 g/dL (ref 31.5–35.7)
MCV: 92 fL (ref 79–97)
Platelets: 165 x10E3/uL (ref 150–450)
RBC: 4.92 x10E6/uL (ref 4.14–5.80)
RDW: 14.5 % (ref 11.6–15.4)
WBC: 11.2 x10E3/uL — ABNORMAL HIGH (ref 3.4–10.8)

## 2024-08-08 LAB — TSH: TSH: 4.93 u[IU]/mL — ABNORMAL HIGH (ref 0.450–4.500)

## 2024-08-08 LAB — PSA: Prostate Specific Ag, Serum: 1 ng/mL (ref 0.0–4.0)

## 2024-08-12 ENCOUNTER — Telehealth: Payer: Self-pay | Admitting: Internal Medicine

## 2024-08-12 ENCOUNTER — Other Ambulatory Visit: Payer: Self-pay | Admitting: Family Medicine

## 2024-08-12 DIAGNOSIS — J439 Emphysema, unspecified: Secondary | ICD-10-CM

## 2024-08-12 MED ORDER — ZOSTER VAC RECOMB ADJUVANTED 50 MCG/0.5ML IM SUSR: 0.5000 mL | Freq: Once | INTRAMUSCULAR | 0 refills | Status: AC

## 2024-08-12 NOTE — Addendum Note (Signed)
 Addended by: VICCI SOBER B on: 08/12/2024 01:31 PM   Modules accepted: Orders

## 2024-08-12 NOTE — Telephone Encounter (Signed)
 Copied from CRM [id number]. Topic: Appointments - Appointment Scheduling >> Aug 11, 2024  1:09 PM George Robbins wrote: Patient called in wants to speak with Dr Norleen , patient states he believer it was about sending the shingles vaccine to the pharmacy for him. Patient wasn't really sure. Please cb

## 2024-08-12 NOTE — Telephone Encounter (Signed)
 Called & spoke to George Robbins, the patient's son  (authorized to receive information per DPR on file). Informed of prescription sent to the pharmacy electronically. George jr. expressed verbal understanding.

## 2024-08-12 NOTE — Telephone Encounter (Signed)
 Just sent rxn electronically to his pharmacy.

## 2024-08-20 ENCOUNTER — Other Ambulatory Visit: Payer: Self-pay | Admitting: Internal Medicine

## 2024-08-20 ENCOUNTER — Telehealth: Payer: Self-pay | Admitting: Internal Medicine

## 2024-08-20 DIAGNOSIS — J439 Emphysema, unspecified: Secondary | ICD-10-CM

## 2024-08-20 MED ORDER — ALBUTEROL SULFATE HFA 108 (90 BASE) MCG/ACT IN AERS: INHALATION_SPRAY | RESPIRATORY_TRACT | 2 refills | Status: AC

## 2024-08-20 NOTE — Telephone Encounter (Signed)
 Copied from CRM [id number]. Topic: General - Other >> Aug 20, 2024 12:54 PM Kevelyn M wrote:  Reason for CRM: Received and order fornebulizer machine. Office visit notes it only stated she wanted to order a nebulizer and albuterol . We never received an order for the albuterol . A medication refill request was put in.

## 2024-08-20 NOTE — Telephone Encounter (Signed)
 Copied from CRM #[id number]. Topic: Clinical - Medication Refill >> Aug 20, 2024 12:52 PM Kevelyn M wrote: Medication: albuterol  (VENTOLIN  HFA) 108 (90 Base) MCG/ACT inhaler  Has the patient contacted their pharmacy? Yes (Agent: If no, request that the patient contact the pharmacy for the refill. If patient does not wish to contact the pharmacy document the reason why and proceed with request.) (Agent: If yes, when and what did the pharmacy advise?)  This is the patient's preferred pharmacy:  [facility]  Fax:[phone]  Is this the correct pharmacy for this prescription? Yes If no, delete pharmacy and type the correct one.   Has the prescription been filled recently? No  Is the patient out of the medication? Yes  Has the patient been seen for an appointment in the last year OR does the patient have an upcoming appointment? Yes  Can we respond through MyChart? No  Agent: Please be advised that Rx refills may take up to 3 business days. We ask that you follow-up with your pharmacy.

## 2024-08-22 ENCOUNTER — Other Ambulatory Visit: Payer: Self-pay | Admitting: Internal Medicine

## 2024-08-23 MED ORDER — ALBUTEROL SULFATE (2.5 MG/3ML) 0.083% IN NEBU: 2.5000 mg | INHALATION_SOLUTION | Freq: Four times a day (QID) | RESPIRATORY_TRACT | 1 refills | Status: AC | PRN

## 2024-08-23 NOTE — Addendum Note (Signed)
 Addended by: VICCI SOBER B on: 08/23/2024 12:48 PM   Modules accepted: Orders

## 2024-08-23 NOTE — Telephone Encounter (Signed)
 Let pt know that the rxn for aluberol nebulizer solution was sent to his pharmacy. To be used as needed with the nebulizer machine that we ordered.

## 2024-08-24 NOTE — Telephone Encounter (Signed)
Called patient but no answer. LVM to call back.

## 2024-08-25 NOTE — Telephone Encounter (Signed)
 Called & spoke to [employer]. (authorized to receive information per DPR on file). Informed of Dr.Johnson's message below. Lytle Raddle. Expressed verbal understanding of all discussed.

## 2024-08-30 ENCOUNTER — Other Ambulatory Visit: Payer: Self-pay | Admitting: Internal Medicine

## 2024-08-30 DIAGNOSIS — I2510 Atherosclerotic heart disease of native coronary artery without angina pectoris: Secondary | ICD-10-CM

## 2024-09-02 ENCOUNTER — Other Ambulatory Visit: Payer: Self-pay | Admitting: Internal Medicine

## 2024-09-02 DIAGNOSIS — H1013 Acute atopic conjunctivitis, bilateral: Secondary | ICD-10-CM

## 2024-09-08 ENCOUNTER — Other Ambulatory Visit: Payer: Self-pay | Admitting: Internal Medicine

## 2024-09-08 DIAGNOSIS — R7303 Prediabetes: Secondary | ICD-10-CM

## 2024-09-11 ENCOUNTER — Ambulatory Visit: Admitting: Podiatry

## 2024-09-11 ENCOUNTER — Encounter: Payer: Self-pay | Admitting: Podiatry

## 2024-09-11 ENCOUNTER — Other Ambulatory Visit

## 2024-09-11 ENCOUNTER — Ambulatory Visit

## 2024-09-11 DIAGNOSIS — M79674 Pain in right toe(s): Secondary | ICD-10-CM | POA: Diagnosis not present

## 2024-09-11 DIAGNOSIS — B351 Tinea unguium: Secondary | ICD-10-CM

## 2024-09-11 DIAGNOSIS — Z7901 Long term (current) use of anticoagulants: Secondary | ICD-10-CM

## 2024-09-11 DIAGNOSIS — M79675 Pain in left toe(s): Secondary | ICD-10-CM
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
## 2024-09-18 ENCOUNTER — Ambulatory Visit

## 2024-09-25 ENCOUNTER — Ambulatory Visit: Attending: Family Medicine

## 2024-09-25 DIAGNOSIS — E039 Hypothyroidism, unspecified: Secondary | ICD-10-CM

## 2024-09-26 ENCOUNTER — Ambulatory Visit: Payer: Self-pay | Admitting: Internal Medicine

## 2024-09-26 ENCOUNTER — Other Ambulatory Visit: Payer: Self-pay | Admitting: Internal Medicine

## 2024-09-26 DIAGNOSIS — E039 Hypothyroidism, unspecified: Secondary | ICD-10-CM

## 2024-09-26 LAB — TSH: TSH: 8.81 u[IU]/mL — ABNORMAL HIGH (ref 0.450–4.500)

## 2024-09-26 MED ORDER — LEVOTHYROXINE SODIUM 137 MCG PO TABS: 137.0000 ug | ORAL_TABLET | Freq: Every day | ORAL | 1 refills | Status: AC

## 2024-09-26 NOTE — Progress Notes (Signed)
 Let pt or his son (as pt has difficulty hearing) that his thyroid  level is still not in normal range. If he has been taking the Levothyroxine  112 mcg consistently then we need to increase the dose to 137 mcg daily. Updated rxn sent to his pharmacy. After being on the new dose for 6-7 weeks, he should return to the lab for recheck thyroid  level.

## 2024-09-29 NOTE — Telephone Encounter (Signed)
 Copied from CRM [id number]. Topic: Clinical - Lab/Test Results >> Sep 29, 2024  2:28 PM Dedra B wrote:  Reason for CRM: Pt son, Kross, calling regarding pt's TSH results. Wanted to let Dr. Vicci know that pt is taking thyroid  meds as prescribed. Pls call Carden at [phone] to answer questions regarding med changes.

## 2024-10-13 ENCOUNTER — Telehealth: Payer: Self-pay | Admitting: Internal Medicine

## 2024-10-13 NOTE — Telephone Encounter (Signed)
 Copied from CRM #[id number]. Topic: Clinical - Order For Equipment >> Oct 13, 2024 10:30 AM [place] M wrote:  Reason for CRM: Dr. Carlie with ENT recommended patient to have adjust his home set up to have a humidifier added to patient's oxygen  to prevent blood clots in patient's nose. Advised PCP will have to place the order for the humidifier/oxygen  for the patient's home set up. Patient son Param CB# [phone].

## 2024-10-14 ENCOUNTER — Telehealth: Payer: Self-pay | Admitting: Internal Medicine

## 2024-10-14 NOTE — Telephone Encounter (Signed)
 Error

## 2024-10-14 NOTE — Telephone Encounter (Signed)
 Called & spoke to [place], the patient's son (authorized to receive information per DPR on file). Inquired who is the medical supplier for the oxygen . Ioannis stated that he is unable to remember at this time but will call back soon with the company name. Awaiting call back.

## 2024-10-14 NOTE — Telephone Encounter (Signed)
 Patient returned call, states the name of ordering company, is [employer]

## 2024-10-14 NOTE — Telephone Encounter (Signed)
 Find out from son who is his medical supplier for the oxygen .

## 2024-10-14 NOTE — Telephone Encounter (Signed)
 Pt unconfirmed appt (per vr) lvm

## 2024-10-16 NOTE — Telephone Encounter (Signed)
 Please call [facility] and let them know that pt gets his home Oxygen  through them. I request that humidification be added to his oxygen  as the dryness from use of O2 is causing nose bleeds. I have written a letter that can be faxed to them if they need it in writing. Thanks.

## 2024-10-18 ENCOUNTER — Other Ambulatory Visit: Payer: Self-pay | Admitting: Internal Medicine

## 2024-10-18 DIAGNOSIS — N401 Enlarged prostate with lower urinary tract symptoms: Secondary | ICD-10-CM

## 2024-10-18 DIAGNOSIS — I10 Essential (primary) hypertension: Secondary | ICD-10-CM

## 2024-10-19 NOTE — Telephone Encounter (Signed)
 Letter successfully faxed to [facility] on 10/19/24. Fax Number: [phone].

## 2024-10-20 NOTE — Telephone Encounter (Signed)
 Called [name] and spoke to [place]. Inquired if the fax that was sent was received. Kait confirmed that the updated order was received and the patient is on the schedule today 10/20/24 to be seen for the changes needed. No further assistance needed.

## 2024-11-23 ENCOUNTER — Ambulatory Visit

## 2024-11-24 ENCOUNTER — Ambulatory Visit

## 2024-11-24 DIAGNOSIS — E039 Hypothyroidism, unspecified: Secondary | ICD-10-CM

## 2024-11-25 ENCOUNTER — Ambulatory Visit: Payer: Self-pay | Admitting: Internal Medicine

## 2024-11-25 LAB — TSH: TSH: 0.932 u[IU]/mL (ref 0.450–4.500)

## 2024-11-26 ENCOUNTER — Other Ambulatory Visit: Payer: Self-pay | Admitting: Internal Medicine

## 2024-11-26 DIAGNOSIS — H1013 Acute atopic conjunctivitis, bilateral: Secondary | ICD-10-CM

## 2024-12-11 ENCOUNTER — Ambulatory Visit: Admitting: Internal Medicine

## 2024-12-18 ENCOUNTER — Ambulatory Visit (INDEPENDENT_AMBULATORY_CARE_PROVIDER_SITE_OTHER): Payer: Self-pay | Admitting: Podiatry

## 2024-12-18 DIAGNOSIS — Z91199 Patient's noncompliance with other medical treatment and regimen due to unspecified reason: Secondary | ICD-10-CM

## 2024-12-18 NOTE — Progress Notes (Signed)
 No show

## 2024-12-21 ENCOUNTER — Other Ambulatory Visit: Payer: Self-pay | Admitting: Internal Medicine

## 2024-12-21 DIAGNOSIS — I25119 Atherosclerotic heart disease of native coronary artery with unspecified angina pectoris: Secondary | ICD-10-CM

## 2025-01-01 ENCOUNTER — Ambulatory Visit: Admitting: Internal Medicine

## 2025-01-15 ENCOUNTER — Ambulatory Visit: Admitting: Internal Medicine
